# Patient Record
Sex: Male | Born: 1945 | Race: White | Hispanic: No | Marital: Married | Smoking: Never smoker
Health system: Southern US, Community
[De-identification: ages and names within clinical notes are randomized; demographics above are authoritative.]

## PROBLEM LIST (undated history)

## (undated) DIAGNOSIS — Z859 Personal history of malignant neoplasm, unspecified: Secondary | ICD-10-CM

## (undated) DIAGNOSIS — G4730 Sleep apnea, unspecified: Secondary | ICD-10-CM

## (undated) DIAGNOSIS — C439 Malignant melanoma of skin, unspecified: Secondary | ICD-10-CM

## (undated) DIAGNOSIS — Z85820 Personal history of malignant melanoma of skin: Secondary | ICD-10-CM

## (undated) DIAGNOSIS — Z9889 Other specified postprocedural states: Secondary | ICD-10-CM

## (undated) DIAGNOSIS — Z87448 Personal history of other diseases of urinary system: Secondary | ICD-10-CM

## (undated) DIAGNOSIS — Z8669 Personal history of other diseases of the nervous system and sense organs: Secondary | ICD-10-CM

## (undated) DIAGNOSIS — K649 Unspecified hemorrhoids: Secondary | ICD-10-CM

## (undated) DIAGNOSIS — M1990 Unspecified osteoarthritis, unspecified site: Secondary | ICD-10-CM

## (undated) DIAGNOSIS — K5730 Diverticulosis of large intestine without perforation or abscess without bleeding: Secondary | ICD-10-CM

## (undated) DIAGNOSIS — R350 Frequency of micturition: Secondary | ICD-10-CM

## (undated) DIAGNOSIS — Z87898 Personal history of other specified conditions: Secondary | ICD-10-CM

## (undated) DIAGNOSIS — Z87438 Personal history of other diseases of male genital organs: Secondary | ICD-10-CM

## (undated) DIAGNOSIS — Z9109 Other allergy status, other than to drugs and biological substances: Secondary | ICD-10-CM

## (undated) DIAGNOSIS — N35919 Unspecified urethral stricture, male, unspecified site: Secondary | ICD-10-CM

## (undated) HISTORY — PX: TONSILLECTOMY: SUR1361

## (undated) HISTORY — PX: OTHER SURGICAL HISTORY: SHX169

## (undated) HISTORY — DX: Unspecified hemorrhoids: K64.9

## (undated) HISTORY — PX: UVULOPALATOPHARYNGOPLASTY: SHX827

## (undated) HISTORY — PX: LAPAROSCOPIC INGUINAL HERNIA REPAIR: SUR788

## (undated) HISTORY — PX: KNEE ARTHROSCOPY W/ MENISCECTOMY: SHX1879

## (undated) HISTORY — DX: Personal history of other diseases of the nervous system and sense organs: Z86.69

---

## 2004-01-01 ENCOUNTER — Other Ambulatory Visit: Payer: Self-pay

## 2004-03-16 ENCOUNTER — Ambulatory Visit: Payer: Self-pay | Admitting: Gastroenterology

## 2004-05-19 ENCOUNTER — Ambulatory Visit: Payer: Self-pay | Admitting: Surgery

## 2006-04-25 HISTORY — PX: OTHER SURGICAL HISTORY: SHX169

## 2006-05-20 ENCOUNTER — Emergency Department: Payer: Self-pay | Admitting: Emergency Medicine

## 2006-05-23 ENCOUNTER — Ambulatory Visit: Payer: Self-pay | Admitting: Specialist

## 2009-05-20 ENCOUNTER — Emergency Department: Payer: Self-pay | Admitting: Emergency Medicine

## 2012-04-09 LAB — BASIC METABOLIC PANEL
Potassium: 4.4 mmol/L (ref 3.4–5.3)
Sodium: 142 mmol/L (ref 137–147)

## 2012-04-09 LAB — LIPID PANEL: Cholesterol: 178 mg/dL (ref 0–200)

## 2012-04-09 LAB — TSH: TSH: 1.12 u[IU]/mL (ref 0.41–5.90)

## 2012-10-03 ENCOUNTER — Ambulatory Visit: Payer: Self-pay | Admitting: Internal Medicine

## 2012-10-08 ENCOUNTER — Encounter: Payer: Self-pay | Admitting: Internal Medicine

## 2012-10-08 ENCOUNTER — Ambulatory Visit (INDEPENDENT_AMBULATORY_CARE_PROVIDER_SITE_OTHER): Payer: Managed Care, Other (non HMO) | Admitting: Internal Medicine

## 2012-10-08 VITALS — BP 110/70 | HR 62 | Temp 98.0°F | Ht 69.25 in | Wt 181.5 lb

## 2012-10-08 DIAGNOSIS — C4490 Unspecified malignant neoplasm of skin, unspecified: Secondary | ICD-10-CM

## 2012-10-08 DIAGNOSIS — M1990 Unspecified osteoarthritis, unspecified site: Secondary | ICD-10-CM

## 2012-10-08 DIAGNOSIS — K649 Unspecified hemorrhoids: Secondary | ICD-10-CM

## 2012-10-08 DIAGNOSIS — N400 Enlarged prostate without lower urinary tract symptoms: Secondary | ICD-10-CM

## 2012-10-08 DIAGNOSIS — Z9109 Other allergy status, other than to drugs and biological substances: Secondary | ICD-10-CM

## 2012-10-08 DIAGNOSIS — M129 Arthropathy, unspecified: Secondary | ICD-10-CM

## 2012-10-09 ENCOUNTER — Encounter: Payer: Self-pay | Admitting: Internal Medicine

## 2012-10-09 DIAGNOSIS — Z9109 Other allergy status, other than to drugs and biological substances: Secondary | ICD-10-CM | POA: Insufficient documentation

## 2012-10-09 DIAGNOSIS — C4490 Unspecified malignant neoplasm of skin, unspecified: Secondary | ICD-10-CM | POA: Insufficient documentation

## 2012-10-09 DIAGNOSIS — N400 Enlarged prostate without lower urinary tract symptoms: Secondary | ICD-10-CM | POA: Insufficient documentation

## 2012-10-09 DIAGNOSIS — K649 Unspecified hemorrhoids: Secondary | ICD-10-CM | POA: Insufficient documentation

## 2012-10-09 DIAGNOSIS — M1990 Unspecified osteoarthritis, unspecified site: Secondary | ICD-10-CM | POA: Insufficient documentation

## 2012-10-09 DIAGNOSIS — Z91048 Other nonmedicinal substance allergy status: Secondary | ICD-10-CM | POA: Insufficient documentation

## 2012-10-09 NOTE — Assessment & Plan Note (Signed)
Intermittent flares.  Uses stool softeners and fiber pills.  Follow.

## 2012-10-09 NOTE — Assessment & Plan Note (Signed)
Knee pain and neck pain intermittently.  Exercises.  Takes Mobic rarely.  Follow. Stable.

## 2012-10-09 NOTE — Assessment & Plan Note (Signed)
Sees Dr Tannenbaum ([facility]).  On current med regimen.  Follow.   

## 2012-10-09 NOTE — Assessment & Plan Note (Signed)
On Claritin now.  Saline nasal flushes - continue.

## 2012-10-09 NOTE — Assessment & Plan Note (Signed)
History of squamous cell cancer and melanoma.  Sees Dr Adolphus Birchwood and Dr Lorn Junes [facility]).  Stable.  Follow.

## 2012-10-09 NOTE — Progress Notes (Signed)
Subjective:    [name], male    DOB: [birth year], 66 y.o.   MRN: [id number]  HPI 67 year old male with past history of an enlarged prostate followed by Dr Marcello Fennel, allergies and arthritis.  He comes in today to follow up on these issues as well as to establish care.  Has some issues with neck pain.  Saw dr Katrinka Blazing.  Was told had arthritis and spurs.  Some issues with arthritis in his knees.  Stays active.  Rides his bicycle.  Helps.  Some allergy problems.  Previously took Careers adviser.  Now on Claritin.  Seeing if this will control symptoms.  Continue saline nasal flushes.  No cough or congestion.  No sob.  No significant acid reflux.  Sees Dr Adolphus Birchwood for skin cancer.  Had melanoma on his back.  Squamous cell cancer - right arm, leg and face.  Also followed by Dr Lorn Junes at [facility].  He also has problems with urinary hesitancy, etc.  Has an enlarged prostate.  Sees Dr Patsi Sears.   He is following PSAs.    Past Medical History  Diagnosis Date  . H/O sleep apnea   . Allergy     Chronic  . Hemorrhoids   . Diverticulosis 10/1996    Seen on flexible sigmoidoscopy  . BPH with elevated PSA   . Cancer     melenoma  . Cancer     Squamous    Outpatient Encounter Prescriptions as of 10/08/2012  Medication Sig Dispense Refill  . alfuzosin (UROXATRAL) 10 MG 24 hr tablet Take 10 mg by mouth daily.      Marland Kitchen dutasteride (AVODART) 0.5 MG capsule Take 0.5 mg by mouth daily.      . fexofenadine (ALLEGRA) 180 MG tablet Take 1 tablet by mouth once a day as needed      . hydrocortisone (PROCTOZONE-HC) 2.5 % rectal cream Place 1 application rectally daily.      . meloxicam (MOBIC) 15 MG tablet Take 15 mg by mouth daily.      . Multiple Vitamins-Minerals-FA (UDAMIN SP PO) Take by mouth daily.      . solifenacin (VESICARE) 5 MG tablet Take 10 mg by mouth daily.      . [DISCONTINUED] hydrocortisone (ANUSOL-HC) 2.5 % rectal cream Place rectally 2 (two) times daily.       No facility-administered  encounter medications on file as of 10/08/2012.    Review of Systems Patient denies any headache, lightheadedness or dizziness.  On claritin for allergies.  See above.  No chest pain, tightness or palpitations.  No increased shortness of breath, cough or congestion.  No nausea or vomiting.  No acid reflux.  No abdominal pain or cramping.  No bowel change, such as diarrhea, constipation, BRBPR or melana.  Some problems with hemorrhoids.  Takes a stool softener and fiver pills.  Urinary issues as outlined.  Some arthritis as outlined.  Stays active.  Rides his bike.  Had colonoscopy.  Sates was told he needed f/u colonoscopy in 10 years from last.  Obtain records from outside.       Objective:   Physical Exam Filed Vitals:   10/08/12 1034  BP: 110/70  Pulse: 62  Temp: 98 F (31.35 C)   67 year old male in no acute distress.  HEENT:  Nares - clear.  Oropharynx - without lesions. NECK:  Supple.  Nontender.  No audible carotid bruit.  HEART:  Appears to be regular.   LUNGS:  No crackles  or wheezing audible.  Respirations even and unlabored.   RADIAL PULSE:  Equal bilaterally.  ABDOMEN:  Soft.  Nontender.  Bowel sounds present and normal.  No audible abdominal bruit.  GU:  Performed through urology.  EXTREMITIES:  No increased edema present.  DP pulses palpable and equal bilaterally.     SKIN:  No rash.       Assessment & Plan:  HEALTH MAINTENANCE.  Prostate checks and PSAs performed through urology.  Colonoscopy - normal per pt.  Obtain records.  States recommended f/u colonoscopy in 10 years after last.

## 2013-04-16 ENCOUNTER — Ambulatory Visit (INDEPENDENT_AMBULATORY_CARE_PROVIDER_SITE_OTHER): Payer: Managed Care, Other (non HMO) | Admitting: Internal Medicine

## 2013-04-16 ENCOUNTER — Encounter: Payer: Self-pay | Admitting: Internal Medicine

## 2013-04-16 VITALS — BP 120/80 | HR 66 | Temp 98.2°F | Ht 69.25 in | Wt 185.5 lb

## 2013-04-16 DIAGNOSIS — J069 Acute upper respiratory infection, unspecified: Secondary | ICD-10-CM

## 2013-04-16 MED ORDER — AMOXICILLIN 875 MG PO TABS: 875.0000 mg | ORAL_TABLET | Freq: Two times a day (BID) | ORAL | Status: DC

## 2013-04-16 NOTE — Progress Notes (Signed)
Pre-visit discussion using our clinic review tool. No additional management support is needed unless otherwise documented below in the visit note.  

## 2013-04-19 ENCOUNTER — Encounter: Payer: Self-pay | Admitting: Internal Medicine

## 2013-04-19 NOTE — Assessment & Plan Note (Signed)
Symptoms as outlined.  Persistent.  Treat with amoxicillin as directed.  Saline nasal spray as directed.  Follow.

## 2013-04-19 NOTE — Progress Notes (Signed)
  Subjective:    [name], male    DOB: 08/21/1945, 67 y.o.   MRN: [id number]  Cough  67 year old male with past history of an enlarged prostate followed by Dr Marcello Fennel, allergies and arthritis.  He comes in today as a work in with concerns regarding cough and congestion.  Symptoms started one week ago.  Started with sore throat.  Developed increased cough and congestion.  Went to acute care three days ago.  Was given tessalon perles and cough syrup.  Is having persistent symptoms.  Cough is now productive of green mucus.  Minimal sore throat.  Some drainage.  No vomiting or diarrhea.  No chest tightness or wheezing.  Occasional runny nose.  Taking tylenol cold as well.     Past Medical History  Diagnosis Date  . H/O sleep apnea   . Allergy     Chronic  . Hemorrhoids   . Diverticulosis 10/1996    Seen on flexible sigmoidoscopy  . BPH with elevated PSA   . Cancer     melenoma  . Cancer     Squamous  . Nephritis     age 34    Outpatient Encounter Prescriptions as of 04/16/2013  Medication Sig  . alfuzosin (UROXATRAL) 10 MG 24 hr tablet Take 10 mg by mouth daily.  . benzonatate (TESSALON) 100 MG capsule Take by mouth 3 (three) times daily as needed for cough.  . chlorpheniramine-HYDROcodone (TUSSIONEX) 10-8 MG/5ML LQCR Take 5 mLs by mouth at bedtime as needed for cough.  . fexofenadine (ALLEGRA) 180 MG tablet Take 1 tablet by mouth once a day as needed  . hydrocortisone (PROCTOZONE-HC) 2.5 % rectal cream Place 1 application rectally daily.  . meloxicam (MOBIC) 15 MG tablet Take 15 mg by mouth daily.  . Multiple Vitamins-Minerals-FA (UDAMIN SP PO) Take by mouth daily.  . solifenacin (VESICARE) 5 MG tablet Take 10 mg by mouth daily.  Marland Kitchen amoxicillin (AMOXIL) 875 MG tablet Take 1 tablet (875 mg total) by mouth 2 (two) times daily.  . [DISCONTINUED] dutasteride (AVODART) 0.5 MG capsule Take 0.5 mg by mouth daily.    Review of Systems  Respiratory: Positive for cough.    Patient denies any headache, lightheadedness or dizziness.  Occasional runny nose.  Minimal sore throat.  No chest pain or tightness.  No increased shortness of breath.  Does report the cough and congestion as outlined.   No nausea or vomiting.  No acid reflux.  No bowel change, such as diarrhea.        Objective:   Physical Exam  Filed Vitals:   04/16/13 1203  BP: 120/80  Pulse: 66  Temp: 98.2 F (40.36 C)   67 year old male in no acute distress.  HEENT:  Nares - clear.  Oropharynx - without lesions. NECK:  Supple.  Nontender.  No audible carotid bruit.  HEART:  Appears to be regular.   LUNGS:  No crackles or wheezing audible.  Respirations even and unlabored.       Assessment & Plan:  HEALTH MAINTENANCE.  Prostate checks and PSAs performed through urology.  Colonoscopy - normal per pt.  States recommended f/u colonoscopy in 10 years after last.

## 2013-05-16 ENCOUNTER — Telehealth: Payer: Self-pay | Admitting: Internal Medicine

## 2013-05-16 NOTE — Telephone Encounter (Addendum)
fexofenadine (ALLEGRA) 180 MG tablet   hydrocortisone (PROCTOZONE-HC) 2.5 % rectal cream  [place].

## 2013-05-17 ENCOUNTER — Other Ambulatory Visit: Payer: Self-pay | Admitting: *Deleted

## 2013-05-17 MED ORDER — HYDROCORTISONE 2.5 % RE CREA: 1.0000 "application " | TOPICAL_CREAM | Freq: Every day | RECTAL | Status: DC

## 2013-05-17 MED ORDER — FEXOFENADINE HCL 180 MG PO TABS: 180.0000 mg | ORAL_TABLET | Freq: Every day | ORAL | Status: DC | PRN

## 2013-05-17 NOTE — Telephone Encounter (Signed)
Rx sent via eRx

## 2013-05-21 ENCOUNTER — Telehealth: Payer: Self-pay | Admitting: Internal Medicine

## 2013-05-21 NOTE — Telephone Encounter (Signed)
Fexofenadine 180 mg tablet (Allegra)  - call to [facility].  Generic hydrocortisone 2.5% cream (is this a viable substitute for proctozone -HC 2.5% rectal cream?) - Call into [facility] Would like 90 day supply, can get for $10.00  Pt states he can get the generic hydrocortisone from [facility] for much cheaper.  Meds were previously prescribed by Dr. Ola Spurr.  Needs new rx written by Dr. Nicki Reaper.

## 2013-05-22 MED ORDER — FEXOFENADINE HCL 180 MG PO TABS: 180.0000 mg | ORAL_TABLET | Freq: Every day | ORAL | Status: DC | PRN

## 2013-05-22 MED ORDER — HYDROCORTISONE 2.5 % RE CREA: 1.0000 "application " | TOPICAL_CREAM | Freq: Every day | RECTAL | Status: DC

## 2013-05-22 NOTE — Telephone Encounter (Signed)
Pt.notified

## 2013-05-22 NOTE — Telephone Encounter (Signed)
I sent in the allegra to [facility] and the hydrocortisone cream to [facility].

## 2013-05-23 ENCOUNTER — Telehealth: Payer: Self-pay | Admitting: Internal Medicine

## 2013-05-23 NOTE — Telephone Encounter (Signed)
I thought I sent in the right medication on 05/22/13.  Did he check with pharmacy and is this right.

## 2013-05-23 NOTE — Telephone Encounter (Signed)
Or cell phone # [phone]  Pt came in today wanting to get a rx for hydrocortisone 2.5% cream (30gm tube)   manuf [place] [facility] # [id number] For hemroids Pt wanted to make sure this would be ok to use instead proctzone hc [facility] [address] Please advise pt

## 2013-05-24 ENCOUNTER — Telehealth: Payer: Self-pay | Admitting: *Deleted

## 2013-05-24 MED ORDER — HYDROCORTISONE 2.5 % RE CREA: 1.0000 "application " | TOPICAL_CREAM | Freq: Every day | RECTAL | Status: DC

## 2013-05-24 NOTE — Telephone Encounter (Signed)
Can see if analpram is any cheaper but I think the generic annusol HC is going to be the cheapest medicaiton.  analpram apply to affected area bid x 1 week.  One tube.  No refills.

## 2013-05-24 NOTE — Telephone Encounter (Signed)
Pt walked in office to show [place] which medication he needed sent in. I sent in Rx while patient was in office

## 2013-05-24 NOTE — Telephone Encounter (Signed)
Patients wife called states that the patient needs this medication called in she states he came up here yesterday trying to get this called in. She states it still hasn't been called in wanted to know if he needed to come back up here. I advised her no that it should be taken care of this afternoon.

## 2013-05-24 NOTE — Telephone Encounter (Signed)
Pt called back & I spoke with him as well as the pharmacy & notified him that the [place] $10 list is for the topical Hydrocortisone cream, not the rectal cream. Pt wants to know what else he can do? He will need to have something for the weekend. Please advise

## 2013-05-25 NOTE — Telephone Encounter (Signed)
I spoke with pharmacy & was notified that the analpram would cost about $60  (Same as the [place])

## 2013-07-09 ENCOUNTER — Telehealth: Payer: Self-pay | Admitting: Emergency Medicine

## 2013-07-09 NOTE — Telephone Encounter (Signed)
Referral to [facility] underway for Dr. Evorn Gong

## 2013-07-15 NOTE — Telephone Encounter (Signed)
Pt approved to see [facility] with 4 visits, exp 10/14/13. Auth # [id number]

## 2013-07-17 ENCOUNTER — Encounter: Payer: Self-pay | Admitting: Adult Health

## 2013-07-17 ENCOUNTER — Ambulatory Visit (INDEPENDENT_AMBULATORY_CARE_PROVIDER_SITE_OTHER): Payer: Commercial Managed Care - HMO | Admitting: Adult Health

## 2013-07-17 VITALS — BP 108/62 | HR 59 | Temp 98.2°F | Resp 14 | Wt 179.0 lb

## 2013-07-17 DIAGNOSIS — J069 Acute upper respiratory infection, unspecified: Secondary | ICD-10-CM

## 2013-07-17 DIAGNOSIS — J020 Streptococcal pharyngitis: Secondary | ICD-10-CM

## 2013-07-17 LAB — POCT RAPID STREP A (OFFICE): Rapid Strep A Screen: NEGATIVE

## 2013-07-17 MED ORDER — AMOXICILLIN 875 MG PO TABS: 875.0000 mg | ORAL_TABLET | Freq: Two times a day (BID) | ORAL | Status: DC

## 2013-07-17 MED ORDER — AMOXICILLIN 875 MG PO TABS
875.0000 mg | ORAL_TABLET | Freq: Two times a day (BID) | ORAL | Status: DC
Start: 2013-07-17 — End: 2013-09-13

## 2013-07-17 NOTE — Progress Notes (Signed)
Pre visit review using our clinic review tool, if applicable. No additional management support is needed unless otherwise documented below in the visit note. 

## 2013-07-17 NOTE — Patient Instructions (Signed)
  Start Amoxicillin as directed.  Chloraseptic spray or lozenges for your sore throat.  Drink plenty of fluids to maintain hydration.  Upper Respiratory Infection, Adult An upper respiratory infection (URI) is also known as the common cold. It is often caused by a type of germ (virus). Colds are easily spread (contagious). You can pass it to others by kissing, coughing, sneezing, or drinking out of the same glass. Usually, you get better in 1 or 2 weeks.  HOME CARE   Only take medicine as told by your doctor.  Use a warm mist humidifier or breathe in steam from a hot shower.  Drink enough water and fluids to keep your pee (urine) clear or pale yellow.  Get plenty of rest.  Return to work when your temperature is back to normal or as told by your doctor. You may use a face mask and wash your hands to stop your cold from spreading. GET HELP RIGHT AWAY IF:   After the first few days, you feel you are getting worse.  You have questions about your medicine.  You have chills, shortness of breath, or brown or red spit (mucus).  You have yellow or brown snot (nasal discharge) or pain in the face, especially when you bend forward.  You have a fever, puffy (swollen) neck, pain when you swallow, or white spots in the back of your throat.  You have a bad headache, ear pain, sinus pain, or chest pain.  You have a high-pitched whistling sound when you breathe in and out (wheezing).  You have a lasting cough or cough up blood.  You have sore muscles or a stiff neck. MAKE SURE YOU:   Understand these instructions.  Will watch your condition.  Will get help right away if you are not doing well or get worse. Document Released: 09/28/2007 Document Revised: 07/04/2011 Document Reviewed: 08/16/2010 [facility] Patient Information 2014 [place], [place].

## 2013-07-17 NOTE — Progress Notes (Signed)
[name], male   DOB: 17-Jan-1946, 68 y.o.   MRN: [id number]    Subjective:    [name], male    DOB: November 11, 1945, 68 y.o.   MRN: [id number]  HPI  Pt is a pleasant 68 y/o male who presents to clinic with severe sore throat, cough and congestion since Sunday. Reports not taking any OTC medications for symptoms other than his regular Allegra. Denies fever, chills, shortness of breath or wheezing.   Past Medical History  Diagnosis Date  . H/O sleep apnea   . Allergy     Chronic  . Hemorrhoids   . Diverticulosis 10/1996    Seen on flexible sigmoidoscopy  . BPH with elevated PSA   . Cancer     melenoma  . Cancer     Squamous  . Nephritis     age 38    Current Outpatient Prescriptions on File Prior to Visit  Medication Sig Dispense Refill  . alfuzosin (UROXATRAL) 10 MG 24 hr tablet Take 10 mg by mouth daily.      . fexofenadine (ALLEGRA) 180 MG tablet Take 1 tablet (180 mg total) by mouth daily as needed for allergies or rhinitis.  90 tablet  3  . hydrocortisone (ANUSOL-HC) 2.5 % rectal cream Place 1 application rectally daily.  90 g  0  . meloxicam (MOBIC) 15 MG tablet Take 15 mg by mouth daily.      . Multiple Vitamins-Minerals-FA (UDAMIN SP PO) Take by mouth daily.      . solifenacin (VESICARE) 5 MG tablet Take 10 mg by mouth daily.       No current facility-administered medications on file prior to visit.     Review of Systems  Constitutional: Negative for fever and chills.  HENT: Positive for congestion, postnasal drip, rhinorrhea, sinus pressure and sore throat.   Respiratory: Positive for cough. Negative for shortness of breath and wheezing.   All other systems reviewed and are negative.       Objective:  BP 108/62  Pulse 59  Temp(Src) 98.2 F (36.8 C) (Oral)  Resp 14  Wt 179 lb (81.194 kg)  SpO2 95%   Physical Exam  Constitutional: He is oriented to person, place, and time. He appears well-developed and well-nourished. No  distress.  HENT:  Head: Normocephalic and atraumatic.  Right Ear: External ear normal.  Left Ear: External ear normal.  Pharyngeal erythema significant  Cardiovascular: Normal rate, regular rhythm and normal heart sounds.   Pulmonary/Chest: Effort normal and breath sounds normal. No respiratory distress. He has no wheezes. He has no rales.  Neurological: He is alert and oriented to person, place, and time.  Psychiatric: He has a normal mood and affect. His behavior is normal. Judgment and thought content normal.       Assessment & Plan:   1. URI (upper respiratory infection) Strep test Start Amoxicillin as directed

## 2013-07-19 ENCOUNTER — Telehealth: Payer: Self-pay | Admitting: Emergency Medicine

## 2013-07-19 NOTE — Telephone Encounter (Signed)
Referral for [facility] is underway for apt with Dr. Evorn Gong

## 2013-07-23 ENCOUNTER — Telehealth: Payer: Self-pay | Admitting: Internal Medicine

## 2013-07-23 NOTE — Telephone Encounter (Signed)
Saw Raquel, message forwarded

## 2013-07-23 NOTE — Telephone Encounter (Signed)
Patient Information:  Caller Name: Ibrohim  Phone: [phone]  Patient: Kyle Meadows, Kyle Meadows  Gender: Male  DOB: Nov 24, 1945  Age: 68 Years  PCP: Einar Pheasant  Office Follow Up:  Does the office need to follow up with this patient?: Yes  Instructions For The Office: Please review and Pt is requesting medications  RN Note:  Call back information given.  Pt request a rx for cough and post nasal drip that is keeping him from resting  Symptoms  Reason For Call & Symptoms: Pt was seen in office on 3/25 for URI.  Pt is calling back because he now has a cough that is limiting his sleep and would like cough medication.  Sputum is clear.  Reviewed Health History In EMR: Yes  Reviewed Medications In EMR: Yes  Reviewed Allergies In EMR: Yes  Reviewed Surgeries / Procedures: Yes  Date of Onset of Symptoms: 07/19/2013  Guideline(s) Used:  Cough  Disposition Per Guideline:   Home Care  Reason For Disposition Reached:   Cough with cold symptoms (e.g., runny nose, postnasal drip, throat clearing)  Advice Given:  Call Back If:  You become worse.  Patient Will Follow Care Advice:  YES

## 2013-07-24 ENCOUNTER — Other Ambulatory Visit: Payer: Self-pay | Admitting: Adult Health

## 2013-07-24 MED ORDER — MOMETASONE FUROATE 50 MCG/ACT NA SUSP: NASAL | Status: DC

## 2013-07-24 NOTE — Telephone Encounter (Signed)
Pt notified. States cough is all throughout the day, but worse at night, productive with clear mucus. Pt states he will start Nasonex and use Delsym today to see if this improves symptoms. Will call back tomorrow if he does not notice any improvement.

## 2013-07-24 NOTE — Telephone Encounter (Signed)
I sent in a prescription for Nasonex to help with post nasal drip. Can you find out about his cough? Is it all day? Dry? Has he tried any over the counter medications for cough? When I saw him he had not taken any OTC meds. If cough is not severe I would want him to try Desym with guaifenesin and dextromethorphan first. His cough may be from the post nasal drip. If cough is severe that keeps waking him I will prescribe a stronger cough syrup such as Tussionex.

## 2013-07-24 NOTE — Telephone Encounter (Signed)
Left message for pt to return my call.

## 2013-07-30 NOTE — Telephone Encounter (Signed)
Pt approved to see [facility] with 4 visits exp 10/28/13. Auth # [id number]

## 2013-08-06 ENCOUNTER — Encounter: Payer: Self-pay | Admitting: Internal Medicine

## 2013-08-19 ENCOUNTER — Other Ambulatory Visit: Payer: Self-pay | Admitting: *Deleted

## 2013-08-19 NOTE — Telephone Encounter (Signed)
I am ok to refill this x 1, but if he continues to have problems, he needs evaluation.

## 2013-08-19 NOTE — Telephone Encounter (Signed)
Ok refill? 

## 2013-08-26 MED ORDER — HYDROCORTISONE 2.5 % RE CREA: 1.0000 "application " | TOPICAL_CREAM | Freq: Every day | RECTAL | Status: DC

## 2013-08-26 NOTE — Telephone Encounter (Signed)
Pt walked in requesting refill on his cream. Pt was notified that if he continues to have problems that he will need to be seen. And that we will send in one refill today. Pt states that it has been 10 days since he initially requested this medication.

## 2013-09-13 ENCOUNTER — Ambulatory Visit (INDEPENDENT_AMBULATORY_CARE_PROVIDER_SITE_OTHER): Payer: Commercial Managed Care - HMO | Admitting: Internal Medicine

## 2013-09-13 ENCOUNTER — Encounter: Payer: Self-pay | Admitting: Internal Medicine

## 2013-09-13 VITALS — BP 100/70 | HR 59 | Temp 98.2°F | Ht 69.25 in | Wt 173.5 lb

## 2013-09-13 DIAGNOSIS — C4490 Unspecified malignant neoplasm of skin, unspecified: Secondary | ICD-10-CM

## 2013-09-13 DIAGNOSIS — K649 Unspecified hemorrhoids: Secondary | ICD-10-CM

## 2013-09-13 DIAGNOSIS — N400 Enlarged prostate without lower urinary tract symptoms: Secondary | ICD-10-CM

## 2013-09-13 DIAGNOSIS — Z9109 Other allergy status, other than to drugs and biological substances: Secondary | ICD-10-CM

## 2013-09-13 MED ORDER — HYDROCORTISONE 2.5 % RE CREA: 1.0000 "application " | TOPICAL_CREAM | Freq: Every day | RECTAL | Status: DC

## 2013-09-13 NOTE — Progress Notes (Signed)
Pre visit review using our clinic review tool, if applicable. No additional management support is needed unless otherwise documented below in the visit note. 

## 2013-09-16 ENCOUNTER — Encounter: Payer: Self-pay | Admitting: Internal Medicine

## 2013-09-16 NOTE — Assessment & Plan Note (Signed)
Intermittent flares.  Uses stool softeners and fiber pills.  Follow.  Refilled proctosol.

## 2013-09-16 NOTE — Assessment & Plan Note (Signed)
On allegra.  Saline nasal flushes - continue.    

## 2013-09-16 NOTE — Progress Notes (Signed)
Subjective:    [name], male    DOB: 12/25/1945, 68 y.o.   MRN: [id number]  HPI 68 year old male with past history of an enlarged prostate followed by Dr Hartley Barefoot, allergies and arthritis.  He comes in today for a scheduled follow up.  States he is doing relatively well.  Lose his job recently.  Handling this well.  No significant allergy problems reported.  No cough or congestion.  No sob.  No significant acid reflux.  Sees Dr Evorn Gong for skin cancer.  Had melanoma on his back.  Squamous cell cancer - right arm, leg and face.  Also followed by Dr Manley Mason at [facility].  He also has problems with urinary hesitancy, etc.  Has an enlarged prostate.  Sees Dr Gaynelle Arabian.   He is following PSAs.  He reports having intermittent problems with hemorrhoids.  Uses proctosol.  Has seen two surgeons.  Did not feel surgery warranted.  States may flare 2-3x/year.  Takes fiber and a stool softener daily.    Past Medical History  Diagnosis Date  . H/O sleep apnea   . Allergy     Chronic  . Hemorrhoids   . Diverticulosis 10/1996    Seen on flexible sigmoidoscopy  . BPH with elevated PSA   . Cancer     melenoma  . Cancer     Squamous  . Nephritis     age 45    Outpatient Encounter Prescriptions as of 09/13/2013  Medication Sig  . alfuzosin (UROXATRAL) 10 MG 24 hr tablet Take 10 mg by mouth daily.  . fexofenadine (ALLEGRA) 180 MG tablet Take 1 tablet (180 mg total) by mouth daily as needed for allergies or rhinitis.  . hydrocortisone (ANUSOL-HC) 2.5 % rectal cream Place 1 application rectally daily.  . meloxicam (MOBIC) 15 MG tablet Take 15 mg by mouth daily as needed.   . mometasone (NASONEX) 50 MCG/ACT nasal spray Place 2 sprays into the nose daily as needed. 2 sprays into each nostril daily  . Multiple Vitamins-Minerals-FA (UDAMIN SP PO) Take by mouth daily.  . solifenacin (VESICARE) 5 MG tablet Take 10 mg by mouth daily.  . [DISCONTINUED] hydrocortisone (ANUSOL-HC) 2.5 % rectal cream Place  1 application rectally daily.  . [DISCONTINUED] mometasone (NASONEX) 50 MCG/ACT nasal spray 2 sprays into each nostril daily  . [DISCONTINUED] amoxicillin (AMOXIL) 875 MG tablet Take 1 tablet (875 mg total) by mouth 2 (two) times daily.    Review of Systems Patient denies any headache, lightheadedness or dizziness.  On allegra for allergies.   No chest pain, tightness or palpitations.  No increased shortness of breath, cough or congestion.  No nausea or vomiting.  No acid reflux.  No abdominal pain or cramping.  No bowel change, such as diarrhea, constipation, BRBPR or melana.  Some problems with hemorrhoids.  Takes a stool softener and fiber pills.  Urinary issues as outlined.  Stays active.  Rides his bike.  Had colonoscopy.  Sates was told he needed f/u colonoscopy in 10 years from last.        Objective:   Physical Exam  Filed Vitals:   09/13/13 0758  BP: 100/70  Pulse: 59  Temp: 98.2 F (36.8 C)   Blood pressure recheck:  74/75  68 year old male in no acute distress.  HEENT:  Nares - clear.  Oropharynx - without lesions. NECK:  Supple.  Nontender.  No audible carotid bruit.  HEART:  Appears to be regular.   LUNGS:  No crackles or wheezing audible.  Respirations even and unlabored.   RADIAL PULSE:  Equal bilaterally.  ABDOMEN:  Soft.  Nontender.  Bowel sounds present and normal.  No audible abdominal bruit.  EXTREMITIES:  No increased edema present.  DP pulses palpable and equal bilaterally.       Assessment & Plan:  HEALTH MAINTENANCE.  Prostate checks and PSAs performed through urology.  Colonoscopy - normal per pt.  States recommended f/u colonoscopy in 10 years after last.

## 2013-09-16 NOTE — Assessment & Plan Note (Signed)
Sees Dr Gaynelle Arabian [facility]).  On current med regimen.  Follow.

## 2013-09-16 NOTE — Assessment & Plan Note (Signed)
History of squamous cell cancer and melanoma.  Sees Dr Evorn Gong and Dr Manley Mason [facility]).  Stable.  Follow.  Has f/u with Dr Evorn Gong 10/29/13.

## 2013-09-20 ENCOUNTER — Ambulatory Visit: Payer: Commercial Managed Care - HMO | Admitting: Internal Medicine

## 2013-10-09 ENCOUNTER — Encounter: Payer: Managed Care, Other (non HMO) | Admitting: Internal Medicine

## 2013-10-09 ENCOUNTER — Ambulatory Visit (INDEPENDENT_AMBULATORY_CARE_PROVIDER_SITE_OTHER): Payer: Commercial Managed Care - HMO | Admitting: Adult Health

## 2013-10-09 ENCOUNTER — Encounter: Payer: Self-pay | Admitting: Adult Health

## 2013-10-09 VITALS — BP 121/77 | HR 56 | Temp 98.1°F | Resp 14 | Wt 174.5 lb

## 2013-10-09 DIAGNOSIS — T148XXA Other injury of unspecified body region, initial encounter: Secondary | ICD-10-CM

## 2013-10-09 MED ORDER — METHOCARBAMOL 750 MG PO TABS: 750.0000 mg | ORAL_TABLET | Freq: Three times a day (TID) | ORAL | Status: DC

## 2013-10-09 NOTE — Patient Instructions (Signed)
  Take ibuprofen 600 mg every 6 hours for the next 4-5 days. Take with food.  Robaxin 750 mg 3 times a day for muscle spasms. May cause sedation.  Ice the area for 15 min 4-5 times daily.  Rest - if your symptoms are aggravated then don't do it.  Stretch psoas muscle as we discussed.

## 2013-10-09 NOTE — Progress Notes (Signed)
[name], male   DOB: 1946-01-29, 68 y.o.   MRN: [id number]   Subjective:    [name], male    DOB: 1945/05/10, 68 y.o.   MRN: [id number]  HPI  Patient is a pleasant 68 year old male who presents to clinic with pain of left groin that has been ongoing for a little over one week. He reports that he was riding his bike with his wife. The surface was smooth and not much hills. He did not have any trouble while he was riding. He was storing his bikes in his car when they were getting ready to leave and he noticed a pull in the lower abdomen toward his groin. He has not taken any medication for his pain. He has not iced the area. The pain is worse with sitting. Does not exhibit much pain with walking.    Past Medical History  Diagnosis Date  . H/O sleep apnea   . Allergy     Chronic  . Hemorrhoids   . Diverticulosis 10/1996    Seen on flexible sigmoidoscopy  . BPH with elevated PSA   . Cancer     melenoma  . Cancer     Squamous  . Nephritis     age 89     Past Surgical History  Procedure Laterality Date  . Uvulopalatopharyngoplasty      5 times  . Tonsillectomy    . Knee surgery      right x 2, torn meniscus  . Hernia repair      inguinal  . Wrist surgery      right, s/p trauma     Family History  Problem Relation Age of Onset  . Heart disease Father     CABG x 4  . Arthritis Father      History   Social History  . Marital Status: Married    Spouse Name: N/A    Number of Children: N/A  . Years of Education: N/A   Occupational History  . Not on file.   Social History Main Topics  . Smoking status: Never Smoker   . Smokeless tobacco: Never Used  . Alcohol Use: Yes  . Drug Use: No  . Sexual Activity: Not on file   Other Topics Concern  . Not on file   Social History Narrative  . No narrative on file     Current Outpatient Prescriptions on File Prior to Visit  Medication Sig Dispense Refill  . alfuzosin (UROXATRAL) 10  MG 24 hr tablet Take 10 mg by mouth daily.      . fexofenadine (ALLEGRA) 180 MG tablet Take 1 tablet (180 mg total) by mouth daily as needed for allergies or rhinitis.  90 tablet  3  . hydrocortisone (ANUSOL-HC) 2.5 % rectal cream Place 1 application rectally daily.  90 g  2  . meloxicam (MOBIC) 15 MG tablet Take 15 mg by mouth daily as needed.       . mometasone (NASONEX) 50 MCG/ACT nasal spray Place 2 sprays into the nose daily as needed. 2 sprays into each nostril daily      . Multiple Vitamins-Minerals-FA (UDAMIN SP PO) Take by mouth daily.      . solifenacin (VESICARE) 5 MG tablet Take 10 mg by mouth daily.       No current facility-administered medications on file prior to visit.     Review of Systems  Genitourinary: Negative for scrotal swelling and testicular pain.  Musculoskeletal: Negative  for arthralgias, back pain, gait problem and myalgias.       Pain right groin  All other systems reviewed and are negative.      Objective:  BP 121/77  Pulse 56  Temp(Src) 98.1 F (36.7 C) (Oral)  Resp 14  Wt 174 lb 8 oz (79.153 kg)  SpO2 97%   Physical Exam Negative for hernia, no pain with internal or external rotation of hip on the left, no pain with abduction of hip on the left Positive for tenderness with palpation over psoas muscle on the left side. Excellent femoral pulse on the left     Assessment & Plan:   1. Muscle strain Suspect psoas muscle strain. Ice, NSAIDs, rest, psoas stretch. Instructions provided in witting.

## 2013-10-09 NOTE — Progress Notes (Signed)
Pre visit review using our clinic review tool, if applicable. No additional management support is needed unless otherwise documented below in the visit note. 

## 2013-10-28 ENCOUNTER — Other Ambulatory Visit: Payer: Self-pay | Admitting: Internal Medicine

## 2013-10-28 DIAGNOSIS — L989 Disorder of the skin and subcutaneous tissue, unspecified: Secondary | ICD-10-CM

## 2013-10-28 DIAGNOSIS — N400 Enlarged prostate without lower urinary tract symptoms: Secondary | ICD-10-CM

## 2013-10-28 NOTE — Progress Notes (Signed)
Orders placed for dermatology and urology referral.  Pt has appts scheduled.

## 2013-11-05 ENCOUNTER — Encounter: Payer: Commercial Managed Care - HMO | Admitting: Internal Medicine

## 2014-01-28 ENCOUNTER — Ambulatory Visit (INDEPENDENT_AMBULATORY_CARE_PROVIDER_SITE_OTHER): Payer: Commercial Managed Care - HMO | Admitting: Internal Medicine

## 2014-01-28 ENCOUNTER — Encounter: Payer: Self-pay | Admitting: Internal Medicine

## 2014-01-28 VITALS — BP 110/70 | HR 64 | Temp 98.1°F | Ht 69.25 in | Wt 173.0 lb

## 2014-01-28 DIAGNOSIS — N400 Enlarged prostate without lower urinary tract symptoms: Secondary | ICD-10-CM

## 2014-01-28 DIAGNOSIS — Z13220 Encounter for screening for lipoid disorders: Secondary | ICD-10-CM

## 2014-01-28 DIAGNOSIS — R1032 Left lower quadrant pain: Secondary | ICD-10-CM

## 2014-01-28 DIAGNOSIS — Z91048 Other nonmedicinal substance allergy status: Secondary | ICD-10-CM

## 2014-01-28 DIAGNOSIS — K649 Unspecified hemorrhoids: Secondary | ICD-10-CM

## 2014-01-28 DIAGNOSIS — Z9109 Other allergy status, other than to drugs and biological substances: Secondary | ICD-10-CM

## 2014-01-28 DIAGNOSIS — Z23 Encounter for immunization: Secondary | ICD-10-CM

## 2014-01-28 DIAGNOSIS — C4490 Unspecified malignant neoplasm of skin, unspecified: Secondary | ICD-10-CM

## 2014-01-28 NOTE — Progress Notes (Signed)
Subjective:    [name], male    DOB: 09/08/1945, 68 y.o.   MRN: [id number]  HPI 68 year old male with past history of an enlarged prostate followed by Dr Hartley Barefoot, allergies and arthritis.  He comes in today to follow up on these issues as well as for a complete physical exam.  States he is doing relatively well.  Lost his job recently.  Now working another job.  This is going well.  Decreased stress.   No significant allergy problems reported.  No cough or congestion.  No sob.  No significant acid reflux.  Sees Dr Evorn Gong for skin cancer.  Had melanoma on his back.  Squamous cell cancer - right arm, leg and face.  Also followed by Dr Manley Mason at [facility].  He also has problems with urinary hesitancy, etc.  Has an enlarged prostate.  Sees Dr Gaynelle Arabian.   He is following PSAs.  Just recently evaluated.  Informed him (and me today) of discomfort - lower abdomen.  Describes at a constant ache.  Walking agrevates  States Dr Gaynelle Arabian evaluated and felt prostate ok.  States might have small hernia - not palpated.  Taking mobic.  Due to f/u with urology 02/03/14.  No nausea or vomiting.  Bowels stable.     Past Medical History  Diagnosis Date  . H/O sleep apnea   . Allergy     Chronic  . Hemorrhoids   . Diverticulosis 10/1996    Seen on flexible sigmoidoscopy  . BPH with elevated PSA   . Cancer     melenoma  . Cancer     Squamous  . Nephritis     age 60    Outpatient Encounter Prescriptions as of 01/28/2014  Medication Sig  . alfuzosin (UROXATRAL) 10 MG 24 hr tablet Take 10 mg by mouth daily.  . fexofenadine (ALLEGRA) 180 MG tablet Take 1 tablet (180 mg total) by mouth daily as needed for allergies or rhinitis.  . hydrocortisone (ANUSOL-HC) 2.5 % rectal cream Place 1 application rectally daily.  . meloxicam (MOBIC) 15 MG tablet Take 15 mg by mouth daily as needed.   . Multiple Vitamins-Minerals-FA (UDAMIN SP PO) Take by mouth daily.  Marland Kitchen oxybutynin (DITROPAN-XL) 10 MG 24 hr tablet  Take 10 mg by mouth daily.  . [DISCONTINUED] methocarbamol (ROBAXIN) 750 MG tablet Take 1 tablet (750 mg total) by mouth 3 (three) times daily.  . [DISCONTINUED] mometasone (NASONEX) 50 MCG/ACT nasal spray Place 2 sprays into the nose daily as needed. 2 sprays into each nostril daily  . [DISCONTINUED] solifenacin (VESICARE) 5 MG tablet Take 10 mg by mouth daily.    Review of Systems Patient denies any headache, lightheadedness or dizziness.  On allegra for allergies.   No chest pain, tightness or palpitations.  No increased shortness of breath, cough or congestion.  No nausea or vomiting.  No acid reflux.  No abdominal pain or cramping.  No bowel change, such as diarrhea, constipation, BRBPR or melana.   Urinary issues as outlined.  Stays active.  Rides his bike.  Had colonoscopy.  Sates was told he needed f/u colonoscopy in 10 years from last.  Lower quadrant pain as outlined.  Will occasionally notice it radiating to his testicle.  See above.        Objective:   Physical Exam  Filed Vitals:   01/28/14 0837  BP: 110/70  Pulse: 64  Temp: 98.1 F (36.7 C)   Blood pressure recheck:  118/72  68 year old male in no acute distress.  HEENT:  Nares - clear.  Oropharynx - without lesions. NECK:  Supple.  Nontender.  No audible carotid bruit.  HEART:  Appears to be regular.   LUNGS:  No crackles or wheezing audible.  Respirations even and unlabored.   RADIAL PULSE:  Equal bilaterally.  ABDOMEN:  Soft. Minimal tenderness to palpation - lower quadrant.   Bowel sounds present and normal.  No audible abdominal bruit.  GU:  No pain to palpation of testicles.      EXTREMITIES:  No increased edema present.  DP pulses palpable and equal bilaterally.         Assessment & Plan:  HEALTH MAINTENANCE.  Prostate checks and PSAs performed through urology.   Physical today.  Colonoscopy - normal per pt.  States recommended f/u colonoscopy in 10 years after last.  Colonoscopy due 2015.  Discussed with pt.     I spent 25 minutes with the patient and more than 50% of the time was spent in consultation regarding the above.

## 2014-01-28 NOTE — Progress Notes (Signed)
Pre visit review using our clinic review tool, if applicable. No additional management support is needed unless otherwise documented below in the visit note. 

## 2014-02-02 ENCOUNTER — Encounter: Payer: Self-pay | Admitting: Internal Medicine

## 2014-02-02 DIAGNOSIS — R1032 Left lower quadrant pain: Secondary | ICD-10-CM | POA: Insufficient documentation

## 2014-02-02 NOTE — Assessment & Plan Note (Signed)
On allegra.  Saline nasal flushes - continue.

## 2014-02-02 NOTE — Assessment & Plan Note (Signed)
History of squamous cell cancer and melanoma.  Sees Dr Evorn Gong and Dr Manley Mason [facility]).  Stable.  Follow.  Has f/u with Dr Evorn Gong 05/06/14.

## 2014-02-02 NOTE — Assessment & Plan Note (Signed)
Sees Dr Gaynelle Arabian [facility]).  On current med regimen.  Follow.  Just evaluated.

## 2014-02-02 NOTE — Assessment & Plan Note (Signed)
Persistent pain as outlined.  Discussed further w/up today.  Just saw his urologist.  Per pt, was questioning a possible small hernia.  Unable to palpate.  He is due to follow up with his urologist 02/03/14.  Wants to discuss more with him.  If persistent pain, may need CT.  Pt aware.  To call me after urology appt.

## 2014-02-02 NOTE — Assessment & Plan Note (Signed)
Intermittent flares.  Has been instructed to use stool softeners and fiber pills.  Follow.

## 2014-02-06 ENCOUNTER — Other Ambulatory Visit: Payer: Self-pay | Admitting: Urology

## 2014-02-07 ENCOUNTER — Telehealth: Payer: Self-pay

## 2014-02-07 NOTE — Telephone Encounter (Signed)
Pt states that he still doesn't know what is wrong. And thinks it would make more sense to do the CT before scheduling a surgery.

## 2014-02-07 NOTE — Telephone Encounter (Signed)
We discussed a CT at the last appt.  He is wanting me to go ahead and schedule or does he want to wait until Dr Hartley Barefoot completes w/up.  We discussed waiting to see what he said at this appt.  Just let me know.

## 2014-02-07 NOTE — Telephone Encounter (Signed)
Saw. Dr. Gaynelle Arabian on Monday, has scheduled prostate surgery on 03/17/14. Continues to have ongoing bilateral groin pain. States Dr. Era Bumpers did not address this at this visit, states enlarged prostate not the cause of his pain. Had cystoscopy done at that time as well. Sent for office notes. Last office note mentioned CT for ongoing pain, unchanged since visit 01/28/14

## 2014-02-07 NOTE — Telephone Encounter (Signed)
The patient called and is concerned about his ongoing groin pain.  He states that he has a scan done, however, he was under the impression other scans would be ordered.  He states he is having surgery in November to remove his prostate, but is hesitant to go forward with the surgery until his groin pain is diagnosed.    Pt callback - [phone]

## 2014-02-09 ENCOUNTER — Other Ambulatory Visit: Payer: Self-pay | Admitting: Internal Medicine

## 2014-02-09 DIAGNOSIS — R1032 Left lower quadrant pain: Secondary | ICD-10-CM

## 2014-02-09 NOTE — Progress Notes (Signed)
Order placed for ct abdomen and pelvis.

## 2014-02-09 NOTE — Telephone Encounter (Signed)
Notify him that I will schedule the CT scan, but I would like to (and need to) get lab results back before he has the scan.  Need to make sure kidney function ok.  It appears he is coming in 02/11/14.  Will have Helen schedule for after this date.

## 2014-02-10 NOTE — Telephone Encounter (Signed)
The patient is scheduled at 815 on 10-22 for CT

## 2014-02-11 ENCOUNTER — Other Ambulatory Visit (INDEPENDENT_AMBULATORY_CARE_PROVIDER_SITE_OTHER): Payer: Commercial Managed Care - HMO

## 2014-02-11 ENCOUNTER — Telehealth: Payer: Self-pay | Admitting: *Deleted

## 2014-02-11 DIAGNOSIS — Z13220 Encounter for screening for lipoid disorders: Secondary | ICD-10-CM

## 2014-02-11 DIAGNOSIS — N400 Enlarged prostate without lower urinary tract symptoms: Secondary | ICD-10-CM

## 2014-02-11 DIAGNOSIS — R1032 Left lower quadrant pain: Secondary | ICD-10-CM

## 2014-02-11 LAB — BASIC METABOLIC PANEL
BUN: 18 mg/dL (ref 6–23)
CHLORIDE: 104 meq/L (ref 96–112)
CO2: 32 mEq/L (ref 19–32)
Calcium: 9.2 mg/dL (ref 8.4–10.5)
Creatinine, Ser: 1.1 mg/dL (ref 0.4–1.5)
GFR: 73.01 mL/min (ref >60.00)
Glucose, Bld: 97 mg/dL (ref 70–99)
POTASSIUM: 3.8 meq/L (ref 3.5–5.1)
Sodium: 140 mEq/L (ref 135–145)

## 2014-02-11 LAB — HEPATIC FUNCTION PANEL
ALT: 18 U/L (ref 0–53)
AST: 22 U/L (ref 0–37)
Albumin: 3.9 g/dL (ref 3.5–5.2)
Alkaline Phosphatase: 53 U/L (ref 39–117)
BILIRUBIN TOTAL: 0.7 mg/dL (ref 0.2–1.2)
Bilirubin, Direct: 0.1 mg/dL (ref 0.0–0.3)
Total Protein: 7.2 g/dL (ref 6.0–8.3)

## 2014-02-11 LAB — LIPID PANEL
CHOLESTEROL: 187 mg/dL (ref 0–200)
HDL: 35.5 mg/dL — ABNORMAL LOW (ref >39.00)
LDL CALC: 138 mg/dL — AB (ref 0–99)
NonHDL: 151.5
Total CHOL/HDL Ratio: 5
Triglycerides: 69 mg/dL (ref 0.0–149.0)
VLDL: 13.8 mg/dL (ref 0.0–40.0)

## 2014-02-11 LAB — TSH: TSH: 1.48 u[IU]/mL (ref 0.35–4.50)

## 2014-02-11 LAB — CBC WITH DIFFERENTIAL/PLATELET
BASOS ABS: 0.1 10*3/uL (ref 0.0–0.1)
Basophils Relative: 0.8 % (ref 0.0–3.0)
EOS ABS: 0.2 10*3/uL (ref 0.0–0.7)
Eosinophils Relative: 2.3 % (ref 0.0–5.0)
HCT: 42.8 % (ref 39.0–52.0)
Hemoglobin: 14.1 g/dL (ref 13.0–17.0)
LYMPHS PCT: 25.5 % (ref 12.0–46.0)
Lymphs Abs: 1.8 10*3/uL (ref 0.7–4.0)
MCHC: 32.9 g/dL (ref 30.0–36.0)
MCV: 92.1 fl (ref 78.0–100.0)
Monocytes Absolute: 0.5 10*3/uL (ref 0.1–1.0)
Monocytes Relative: 6.4 % (ref 3.0–12.0)
NEUTROS PCT: 65 % (ref 43.0–77.0)
Neutro Abs: 4.6 10*3/uL (ref 1.4–7.7)
Platelets: 221 10*3/uL (ref 150.0–400.0)
RBC: 4.65 Mil/uL (ref 4.22–5.81)
RDW: 13.5 % (ref 11.5–15.5)
WBC: 7.1 10*3/uL (ref 4.0–10.5)

## 2014-02-11 NOTE — Telephone Encounter (Signed)
Pt has [employer].

## 2014-02-11 NOTE — Telephone Encounter (Signed)
Order placed

## 2014-02-11 NOTE — Telephone Encounter (Signed)
Pt is staying her needs a referral with Dr. Tresa Moore( [facility] in [place]), pt appt is Monday Nov. 2nd (consult)

## 2014-02-11 NOTE — Telephone Encounter (Signed)
Order placed for referral to [facility] (Dr Tresa Moore)

## 2014-02-12 ENCOUNTER — Encounter: Payer: Self-pay | Admitting: *Deleted

## 2014-02-21 ENCOUNTER — Encounter: Payer: Self-pay | Admitting: Internal Medicine

## 2014-02-21 ENCOUNTER — Ambulatory Visit: Payer: Self-pay | Admitting: Internal Medicine

## 2014-02-25 ENCOUNTER — Other Ambulatory Visit: Payer: Self-pay | Admitting: Urology

## 2014-03-04 ENCOUNTER — Encounter: Payer: Self-pay | Admitting: Internal Medicine

## 2014-03-04 ENCOUNTER — Ambulatory Visit (INDEPENDENT_AMBULATORY_CARE_PROVIDER_SITE_OTHER): Payer: Commercial Managed Care - HMO | Admitting: Internal Medicine

## 2014-03-04 ENCOUNTER — Telehealth: Payer: Self-pay | Admitting: Internal Medicine

## 2014-03-04 VITALS — BP 124/80 | HR 53 | Temp 97.9°F | Ht 69.25 in | Wt 175.5 lb

## 2014-03-04 DIAGNOSIS — Z01810 Encounter for preprocedural cardiovascular examination: Secondary | ICD-10-CM

## 2014-03-04 DIAGNOSIS — N400 Enlarged prostate without lower urinary tract symptoms: Secondary | ICD-10-CM

## 2014-03-04 DIAGNOSIS — I723 Aneurysm of iliac artery: Secondary | ICD-10-CM

## 2014-03-04 DIAGNOSIS — Z01818 Encounter for other preprocedural examination: Secondary | ICD-10-CM

## 2014-03-04 DIAGNOSIS — R1032 Left lower quadrant pain: Secondary | ICD-10-CM

## 2014-03-04 NOTE — Telephone Encounter (Signed)
Notify pt that his CT scan reveals no acute inflammatory process of the abdomen or pelvis.  The prostate gland does appear enlarged and appears to be pressing on the bladder base.  He does need to f/u with his urologist regarding further w/up for this.  No evidence of colitis or diverticulitis.  There is some dilation of the arteries in the pelvic area.  These will just need to be monitored.  I would like to refer him to vascular surgery for evaluation and monitoring just to confirm no change.  Need to forward the CT report to his urologist and let them know of finding and that the report is coming.  Thanks.  Let me know if any problems.

## 2014-03-04 NOTE — Progress Notes (Signed)
Pre visit review using our clinic review tool, if applicable. No additional management support is needed unless otherwise documented below in the visit note. 

## 2014-03-04 NOTE — Telephone Encounter (Signed)
Report was reviewed in office with patient & copy of report faxed to [facility]

## 2014-03-07 ENCOUNTER — Encounter: Payer: Self-pay | Admitting: Internal Medicine

## 2014-03-07 DIAGNOSIS — Z01818 Encounter for other preprocedural examination: Secondary | ICD-10-CM | POA: Insufficient documentation

## 2014-03-07 NOTE — Progress Notes (Signed)
Subjective:    [name], male    DOB: [birth year], 68 y.o.   MRN: [id number]  HPI 68 year old male with past history of an enlarged prostate followed by Dr Hartley Barefoot, allergies and arthritis.  He comes in today for a scheduled follow up.   States he is doing relatively well.  Lost his job recently.  Now working another job.  This is going well.  Decreased stress.  No significant allergy problems reported.  No cough or congestion.  No sob.    No chest pain or tightness.  Tries to stay active.  He has had problems with urinary hesitancy, etc.  Has an enlarged prostate.  Has been seeing Dr Gaynelle Arabian.   Just recently evaluated.  reported increased left lower abdominal pain.  Had CT scan that revealed no acute inflammatory process of the abdomen or pelvis.  No hydronephrosis or hydroureter.  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  Markedly enlarged prostate gland with polypoid indentation of the urinary bladder base.  No small bowel obstruction.  No diverticulitis.  He subsequently saw Dr Tresa Moore and is planning to have prostate surgery 03/19/14.  No nausea or vomiting.  Bowels stable.     Past Medical History  Diagnosis Date  . H/O sleep apnea   . Allergy     Chronic  . Hemorrhoids   . Diverticulosis 10/1996    Seen on flexible sigmoidoscopy  . BPH with elevated PSA   . Cancer     melenoma  . Cancer     Squamous  . Nephritis     age 70    Outpatient Encounter Prescriptions as of 03/04/2014  Medication Sig  . alfuzosin (UROXATRAL) 10 MG 24 hr tablet Take 10 mg by mouth daily.  . fexofenadine (ALLEGRA) 180 MG tablet Take 1 tablet (180 mg total) by mouth daily as needed for allergies or rhinitis.  . hydrocortisone (ANUSOL-HC) 2.5 % rectal cream Place 1 application rectally daily.  . meloxicam (MOBIC) 15 MG tablet Take 15 mg by mouth daily as needed.   . Multiple Vitamins-Minerals-FA (UDAMIN SP PO) Take 1 tablet  by mouth daily.   Marland Kitchen oxybutynin (DITROPAN-XL) 10 MG 24 hr tablet Take 10 mg by mouth daily.    Review of Systems Patient denies any headache, lightheadedness or dizziness.  On allegra for allergies.   No chest pain, tightness or palpitations.  No increased shortness of breath, cough or congestion.  No nausea or vomiting.  No acid reflux.  persistent left lower quadrant pain as outlined.  No bowel change, such as diarrhea, constipation, BRBPR or melana.   Urinary issues as outlined.  Stays active.  Rides his bike.  Had colonoscopy.  Sates was told he needed f/u colonoscopy in 10 years from last. CT scan as outlined.  Enlarged prostate.  Planning for surgery.       Objective:   Physical Exam  Filed Vitals:   03/04/14 0907  BP: 124/80  Pulse: 53  Temp: 97.9 F (36.6 C)   Blood pressure recheck:  75/12  68 year old male in no acute distress.  HEENT:  Nares - clear.  Oropharynx - without lesions. NECK:  Supple.  Nontender.  No audible carotid bruit.  HEART:  Appears to be regular.   LUNGS:  No crackles or wheezing audible.  Respirations even and unlabored.   RADIAL PULSE:  Equal bilaterally.  ABDOMEN:  Soft. Minimal tenderness to palpation -  lower quadrant.   Bowel sounds present and normal.  No audible abdominal bruit.     EXTREMITIES:  No increased edema present.  DP pulses palpable and equal bilaterally.         Assessment & Plan:  Enlarged prostate CT as outlined.  Seeing Dr Tresa Moore now.  Planning for surgery.    Left lower quadrant pain CT as outlined.  Enlarged prostate - compressing the bladder.  Given CT findings, feel this is probably contributing to the pain.  Surgery as outlined.  Follow. Confirm resolves after surgery.    Pre-op evaluation Pt tries to stay active.  Reports no chest pain or tightness with increased activity or exertion.  No sob.  EKG reveals SR/SB with ventricular rate of 52.  No acute ischemic changes.  Given asymptomatic, I feel he is at low risk from a  cardiac standpoint to proceed with the planned surgery.  He will need close intra op and post op monitoring of his heart rate and blood pressure to avoid extremes.    ILIAC ARTERY ANEURYSM.  BILATERAL.  Given this finding, will have vascular surgery review and follow.    HEALTH MAINTENANCE.  Prostate checks and PSAs performed through urology.   Physical last visit. Colonoscopy - normal per pt.  States recommended f/u colonoscopy in 10 years after last.  Colonoscopy due 2015.  Have discussed with pt.    I spent 25 minutes with the patient and more than 50% of the time was spent in consultation regarding the above.

## 2014-03-11 ENCOUNTER — Other Ambulatory Visit (HOSPITAL_COMMUNITY): Payer: Self-pay | Admitting: *Deleted

## 2014-03-11 NOTE — Patient Instructions (Addendum)
Kyle Meadows  03/11/2014                           YOUR PROCEDURE IS SCHEDULED ON:  03/19/14                ENTER FROM [address] - GO TO PARKING DECK               LOOK FOR [employer]  / [employer]                              FOLLOW  SIGNS TO SHORT STAY CENTER                 ARRIVE AT SHORT STAY AT:  10:45 AM               CALL THIS NUMBER IF ANY PROBLEMS THE DAY OF SURGERY :               [phone]                                REMEMBER:   Do not eat food or drink liquids AFTER MIDNIGHT                  Take these medicines the morning of surgery with               A SIPS OF WATER :    Oxybutynin / Uroxatrol / allegra      Do not wear jewelry, make-up   Do not wear lotions, powders, or perfumes.   Do not shave legs or underarms 12 hrs. before surgery (men may shave face)  Do not bring valuables to the hospital.  Contacts, dentures or bridgework may not be worn into surgery.  Leave suitcase in the car. After surgery it may be brought to your room.  For patients admitted to the hospital more than one night, checkout time is            11:00 AM                                                      ________________________________________________________________________                                                                                                  Kyle Meadows  Before surgery, you can play an important role.  Because skin is not sterile, your skin needs to be as free of germs as possible.  You can reduce the number of germs on your skin by washing with CHG (chlorahexidine gluconate) soap before surgery.  CHG is an antiseptic cleaner which kills germs and bonds with the skin to continue killing germs even after washing. Please  DO NOT use if you have an allergy to CHG or antibacterial soaps.  If your skin becomes reddened/irritated stop using the CHG and inform your nurse when you arrive at Short Stay. Do not shave  (including legs and underarms) for at least 48 hours prior to the first CHG shower.  You may shave your face. Please follow these instructions carefully:   1.  Shower with CHG Soap the night before surgery and the  morning of Surgery.   2.  If you choose to wash your hair, wash your hair first as usual with your  normal  Shampoo.   3.  After you shampoo, rinse your hair and body thoroughly to remove the  shampoo.                                         4.  Use CHG as you would any other liquid soap.  You can apply chg directly  to the skin and wash . Gently wash with scrungie or clean wascloth    5.  Apply the CHG Soap to your body ONLY FROM THE NECK DOWN.   Do not use on open                           Wound or open sores. Avoid contact with eyes, ears mouth and genitals (private parts).                        Genitals (private parts) with your normal soap.              6.  Wash thoroughly, paying special attention to the area where your surgery  will be performed.   7.  Thoroughly rinse your body with warm water from the neck down.   8.  DO NOT shower/wash with your normal soap after using and rinsing off  the CHG Soap .                9.  Pat yourself dry with a clean towel.             10.  Wear clean pajamas.             11.  Place clean sheets on your bed the night of your first shower and do not  sleep with pets.  Day of Surgery : Do not apply any lotions/deodorants the morning of surgery.  Please wear clean clothes to the hospital/surgery center.  FAILURE TO FOLLOW THESE INSTRUCTIONS MAY RESULT IN THE CANCELLATION OF YOUR SURGERY    PATIENT SIGNATURE_________________________________  ______________________________________________________________________

## 2014-03-12 ENCOUNTER — Encounter (HOSPITAL_COMMUNITY)
Admission: RE | Admit: 2014-03-12 | Discharge: 2014-03-12 | Disposition: A | Discharge status: Home / Self Care | Payer: Commercial Managed Care - HMO | Source: Ambulatory Visit | Attending: Urology | Admitting: Urology

## 2014-03-12 ENCOUNTER — Encounter (HOSPITAL_COMMUNITY): Payer: Self-pay

## 2014-03-12 DIAGNOSIS — Z01812 Encounter for preprocedural laboratory examination: Secondary | ICD-10-CM | POA: Diagnosis present

## 2014-03-12 HISTORY — DX: Unspecified osteoarthritis, unspecified site: M19.90

## 2014-03-12 HISTORY — DX: Frequency of micturition: R35.0

## 2014-03-12 LAB — CBC
HCT: 42.2 % (ref 39.0–52.0)
Hemoglobin: 14.3 g/dL (ref 13.0–17.0)
MCH: 30.6 pg (ref 26.0–34.0)
MCHC: 33.9 g/dL (ref 30.0–36.0)
MCV: 90.4 fL (ref 78.0–100.0)
Platelets: 233 10*3/uL (ref 150–400)
RBC: 4.67 MIL/uL (ref 4.22–5.81)
RDW: 13 % (ref 11.5–15.5)
WBC: 6.6 10*3/uL (ref 4.0–10.5)

## 2014-03-12 LAB — BASIC METABOLIC PANEL
ANION GAP: 9 (ref 5–15)
BUN: 19 mg/dL (ref 6–23)
CO2: 30 mEq/L (ref 19–32)
CREATININE: 1.04 mg/dL (ref 0.50–1.35)
Calcium: 9.7 mg/dL (ref 8.4–10.5)
Chloride: 101 mEq/L (ref 96–112)
GFR calc non Af Amer: 72 mL/min — ABNORMAL LOW (ref >90)
GFR, EST AFRICAN AMERICAN: 83 mL/min — AB (ref >90)
Glucose, Bld: 93 mg/dL (ref 70–99)
Potassium: 4.6 mEq/L (ref 3.7–5.3)
SODIUM: 140 meq/L (ref 137–147)

## 2014-03-17 ENCOUNTER — Inpatient Hospital Stay (HOSPITAL_COMMUNITY): Admission: RE | Admit: 2014-03-17 | Payer: Self-pay | Source: Ambulatory Visit | Admitting: Urology

## 2014-03-17 ENCOUNTER — Encounter (HOSPITAL_COMMUNITY): Admission: RE | Payer: Self-pay | Source: Ambulatory Visit

## 2014-03-17 SURGERY — PROSTATECTOMY, SUPRAPUBIC APPROACH
Anesthesia: General

## 2014-03-19 ENCOUNTER — Inpatient Hospital Stay (HOSPITAL_COMMUNITY): Payer: Medicare HMO | Admitting: Anesthesiology

## 2014-03-19 ENCOUNTER — Encounter (HOSPITAL_COMMUNITY)
Admission: RE | Disposition: A | Discharge status: Home / Self Care | Payer: Self-pay | Source: Ambulatory Visit | Attending: Urology

## 2014-03-19 ENCOUNTER — Inpatient Hospital Stay (HOSPITAL_COMMUNITY)
Admission: RE | Admit: 2014-03-19 | Discharge: 2014-03-20 | DRG: 708 | Disposition: A | Discharge status: Home / Self Care | Payer: Medicare HMO | Source: Ambulatory Visit | Attending: Urology | Admitting: Urology

## 2014-03-19 ENCOUNTER — Encounter (HOSPITAL_COMMUNITY): Payer: Self-pay | Admitting: *Deleted

## 2014-03-19 DIAGNOSIS — Z79899 Other long term (current) drug therapy: Secondary | ICD-10-CM

## 2014-03-19 DIAGNOSIS — M1990 Unspecified osteoarthritis, unspecified site: Secondary | ICD-10-CM | POA: Diagnosis present

## 2014-03-19 DIAGNOSIS — Z85820 Personal history of malignant melanoma of skin: Secondary | ICD-10-CM | POA: Diagnosis not present

## 2014-03-19 DIAGNOSIS — N138 Other obstructive and reflux uropathy: Secondary | ICD-10-CM | POA: Diagnosis present

## 2014-03-19 DIAGNOSIS — N400 Enlarged prostate without lower urinary tract symptoms: Secondary | ICD-10-CM | POA: Diagnosis present

## 2014-03-19 DIAGNOSIS — N401 Enlarged prostate with lower urinary tract symptoms: Principal | ICD-10-CM | POA: Diagnosis present

## 2014-03-19 DIAGNOSIS — R350 Frequency of micturition: Secondary | ICD-10-CM | POA: Diagnosis present

## 2014-03-19 HISTORY — PX: ROBOT ASSISTED LAPAROSCOPIC RADICAL PROSTATECTOMY: SHX5141

## 2014-03-19 LAB — HEMOGLOBIN AND HEMATOCRIT, BLOOD
HCT: 41.1 % (ref 39.0–52.0)
HEMOGLOBIN: 14 g/dL (ref 13.0–17.0)

## 2014-03-19 SURGERY — XI ROBOTIC ASSISTED LAPAROSCOPIC RADICAL PROSTATECTOMY LEVEL 1
Anesthesia: General

## 2014-03-19 MED ORDER — ROCURONIUM BROMIDE 100 MG/10ML IV SOLN
INTRAVENOUS | Status: DC | PRN
  Administered 2014-03-19 (×2): 10 mg via INTRAVENOUS
  Administered 2014-03-19: 50 mg via INTRAVENOUS

## 2014-03-19 MED ORDER — SENNA 8.6 MG PO TABS
1.0000 | ORAL_TABLET | Freq: Two times a day (BID) | ORAL | Status: DC
  Administered 2014-03-19 – 2014-03-20 (×2): 8.6 mg via ORAL
  Filled 2014-03-19 (×2): qty 1

## 2014-03-19 MED ORDER — SULFAMETHOXAZOLE-TRIMETHOPRIM 800-160 MG PO TABS: 1.0000 | ORAL_TABLET | Freq: Two times a day (BID) | ORAL | Status: DC

## 2014-03-19 MED ORDER — FENTANYL CITRATE 0.05 MG/ML IJ SOLN
INTRAMUSCULAR | Status: DC | PRN
  Administered 2014-03-19 (×5): 50 ug via INTRAVENOUS

## 2014-03-19 MED ORDER — PROPOFOL 10 MG/ML IV BOLUS
INTRAVENOUS | Status: DC | PRN
  Administered 2014-03-19: 200 mg via INTRAVENOUS

## 2014-03-19 MED ORDER — GLYCOPYRROLATE 0.2 MG/ML IJ SOLN
INTRAMUSCULAR | Status: DC | PRN
  Administered 2014-03-19: 0.6 mg via INTRAVENOUS

## 2014-03-19 MED ORDER — DEXAMETHASONE SODIUM PHOSPHATE 10 MG/ML IJ SOLN
INTRAMUSCULAR | Status: AC
  Filled 2014-03-19: qty 1

## 2014-03-19 MED ORDER — HYDROMORPHONE HCL 1 MG/ML IJ SOLN
0.2500 mg | INTRAMUSCULAR | Status: DC | PRN
  Administered 2014-03-19 (×2): 0.5 mg via INTRAVENOUS
  Administered 2014-03-19: 0.25 mg via INTRAVENOUS
  Administered 2014-03-19: 0.5 mg via INTRAVENOUS
  Administered 2014-03-19: 0.25 mg via INTRAVENOUS

## 2014-03-19 MED ORDER — CEFAZOLIN SODIUM-DEXTROSE 2-3 GM-% IV SOLR
INTRAVENOUS | Status: AC
  Filled 2014-03-19: qty 50

## 2014-03-19 MED ORDER — OXYCODONE HCL 5 MG/5ML PO SOLN
5.0000 mg | Freq: Once | ORAL | Status: DC | PRN
  Filled 2014-03-19: qty 5

## 2014-03-19 MED ORDER — HYDROMORPHONE HCL 1 MG/ML IJ SOLN
0.5000 mg | INTRAMUSCULAR | Status: DC | PRN
  Administered 2014-03-19: 1 mg via INTRAVENOUS
  Filled 2014-03-19: qty 1

## 2014-03-19 MED ORDER — FENTANYL CITRATE 0.05 MG/ML IJ SOLN
INTRAMUSCULAR | Status: AC
  Filled 2014-03-19: qty 5

## 2014-03-19 MED ORDER — CEFAZOLIN SODIUM-DEXTROSE 2-3 GM-% IV SOLR
2.0000 g | INTRAVENOUS | Status: AC
  Administered 2014-03-19: 2 g via INTRAVENOUS

## 2014-03-19 MED ORDER — GLYCOPYRROLATE 0.2 MG/ML IJ SOLN
INTRAMUSCULAR | Status: AC
  Filled 2014-03-19: qty 3

## 2014-03-19 MED ORDER — LIDOCAINE HCL (CARDIAC) 20 MG/ML IV SOLN
INTRAVENOUS | Status: DC | PRN
  Administered 2014-03-19: 100 mg via INTRAVENOUS

## 2014-03-19 MED ORDER — SODIUM CHLORIDE 0.9 % IJ SOLN
INTRAMUSCULAR | Status: AC
  Filled 2014-03-19: qty 20

## 2014-03-19 MED ORDER — PROMETHAZINE HCL 25 MG/ML IJ SOLN: 6.2500 mg | INTRAMUSCULAR | Status: DC | PRN

## 2014-03-19 MED ORDER — MIDAZOLAM HCL 2 MG/2ML IJ SOLN
INTRAMUSCULAR | Status: AC
  Filled 2014-03-19: qty 2

## 2014-03-19 MED ORDER — HYDROMORPHONE HCL 1 MG/ML IJ SOLN
INTRAMUSCULAR | Status: AC
  Filled 2014-03-19: qty 1

## 2014-03-19 MED ORDER — MIDAZOLAM HCL 5 MG/5ML IJ SOLN
INTRAMUSCULAR | Status: DC | PRN
  Administered 2014-03-19: 2 mg via INTRAVENOUS

## 2014-03-19 MED ORDER — ACETAMINOPHEN 10 MG/ML IV SOLN
1000.0000 mg | Freq: Four times a day (QID) | INTRAVENOUS | Status: DC
  Filled 2014-03-19 (×4): qty 100

## 2014-03-19 MED ORDER — OXYCODONE-ACETAMINOPHEN 5-325 MG PO TABS: 1.0000 | ORAL_TABLET | Freq: Four times a day (QID) | ORAL | Status: DC | PRN

## 2014-03-19 MED ORDER — STERILE WATER FOR IRRIGATION IR SOLN
Status: DC | PRN
  Administered 2014-03-19: 1500 mL

## 2014-03-19 MED ORDER — OXYBUTYNIN CHLORIDE 5 MG PO TABS
5.0000 mg | ORAL_TABLET | Freq: Three times a day (TID) | ORAL | Status: DC | PRN
  Administered 2014-03-19: 5 mg via ORAL
  Filled 2014-03-19 (×3): qty 1

## 2014-03-19 MED ORDER — DEXAMETHASONE SODIUM PHOSPHATE 10 MG/ML IJ SOLN
INTRAMUSCULAR | Status: DC | PRN
  Administered 2014-03-19: 10 mg via INTRAVENOUS

## 2014-03-19 MED ORDER — OXYCODONE HCL 5 MG PO TABS: 5.0000 mg | ORAL_TABLET | ORAL | Status: DC | PRN

## 2014-03-19 MED ORDER — KCL IN DEXTROSE-NACL 20-5-0.45 MEQ/L-%-% IV SOLN
INTRAVENOUS | Status: DC
  Administered 2014-03-19 – 2014-03-20 (×2): via INTRAVENOUS
  Filled 2014-03-19 (×2): qty 1000

## 2014-03-19 MED ORDER — LACTATED RINGERS IV SOLN
INTRAVENOUS | Status: DC
  Administered 2014-03-19: 1000 mL via INTRAVENOUS
  Administered 2014-03-19: 16:00:00 via INTRAVENOUS

## 2014-03-19 MED ORDER — ACETAMINOPHEN 500 MG PO TABS
1000.0000 mg | ORAL_TABLET | Freq: Three times a day (TID) | ORAL | Status: DC
  Administered 2014-03-20 (×2): 1000 mg via ORAL
  Filled 2014-03-19 (×2): qty 2

## 2014-03-19 MED ORDER — BUPIVACAINE LIPOSOME 1.3 % IJ SUSP
20.0000 mL | Freq: Once | INTRAMUSCULAR | Status: DC
  Filled 2014-03-19: qty 20

## 2014-03-19 MED ORDER — BUPIVACAINE LIPOSOME 1.3 % IJ SUSP
INTRAMUSCULAR | Status: DC | PRN
  Administered 2014-03-19: 20 mL

## 2014-03-19 MED ORDER — MEPERIDINE HCL 50 MG/ML IJ SOLN: 6.2500 mg | INTRAMUSCULAR | Status: DC | PRN

## 2014-03-19 MED ORDER — ONDANSETRON HCL 4 MG/2ML IJ SOLN: 4.0000 mg | INTRAMUSCULAR | Status: DC | PRN

## 2014-03-19 MED ORDER — MELOXICAM 15 MG PO TABS
15.0000 mg | ORAL_TABLET | Freq: Every day | ORAL | Status: DC | PRN
  Administered 2014-03-19: 15 mg via ORAL
  Filled 2014-03-19 (×2): qty 1

## 2014-03-19 MED ORDER — SENNOSIDES-DOCUSATE SODIUM 8.6-50 MG PO TABS
1.0000 | ORAL_TABLET | Freq: Two times a day (BID) | ORAL | Status: DC
Start: 2014-03-19 — End: 2014-05-07

## 2014-03-19 MED ORDER — LACTATED RINGERS IR SOLN
Status: DC | PRN
  Administered 2014-03-19: 1000 mL

## 2014-03-19 MED ORDER — SODIUM CHLORIDE 0.9 % IJ SOLN
INTRAMUSCULAR | Status: DC | PRN
  Administered 2014-03-19: 20 mL via INTRAVENOUS

## 2014-03-19 MED ORDER — ROCURONIUM BROMIDE 100 MG/10ML IV SOLN
INTRAVENOUS | Status: AC
  Filled 2014-03-19: qty 2

## 2014-03-19 MED ORDER — LIDOCAINE HCL (CARDIAC) 20 MG/ML IV SOLN
INTRAVENOUS | Status: AC
  Filled 2014-03-19: qty 5

## 2014-03-19 MED ORDER — SODIUM CHLORIDE 0.9 % IV BOLUS (SEPSIS)
1000.0000 mL | Freq: Once | INTRAVENOUS | Status: AC
  Administered 2014-03-19: 1000 mL via INTRAVENOUS

## 2014-03-19 MED ORDER — SUCCINYLCHOLINE CHLORIDE 20 MG/ML IJ SOLN
INTRAMUSCULAR | Status: DC | PRN
  Administered 2014-03-19: 100 mg via INTRAVENOUS

## 2014-03-19 MED ORDER — ONDANSETRON HCL 4 MG/2ML IJ SOLN
INTRAMUSCULAR | Status: AC
  Filled 2014-03-19: qty 2

## 2014-03-19 MED ORDER — ONDANSETRON HCL 4 MG/2ML IJ SOLN
INTRAMUSCULAR | Status: DC | PRN
  Administered 2014-03-19: 4 mg via INTRAVENOUS

## 2014-03-19 MED ORDER — OXYCODONE HCL 5 MG PO TABS: 5.0000 mg | ORAL_TABLET | Freq: Once | ORAL | Status: DC | PRN

## 2014-03-19 MED ORDER — NEOSTIGMINE METHYLSULFATE 10 MG/10ML IV SOLN
INTRAVENOUS | Status: DC | PRN
  Administered 2014-03-19: 3.5 mg via INTRAVENOUS

## 2014-03-19 MED ORDER — DOCUSATE SODIUM 100 MG PO CAPS
100.0000 mg | ORAL_CAPSULE | Freq: Two times a day (BID) | ORAL | Status: DC
  Administered 2014-03-19 – 2014-03-20 (×2): 100 mg via ORAL
  Filled 2014-03-19 (×3): qty 1

## 2014-03-19 MED ORDER — PROPOFOL 10 MG/ML IV BOLUS
INTRAVENOUS | Status: AC
  Filled 2014-03-19: qty 20

## 2014-03-19 MED ORDER — PHENYLEPHRINE 40 MCG/ML (10ML) SYRINGE FOR IV PUSH (FOR BLOOD PRESSURE SUPPORT)
PREFILLED_SYRINGE | INTRAVENOUS | Status: AC
  Filled 2014-03-19: qty 10

## 2014-03-19 SURGICAL SUPPLY — 68 items
CABLE HIGH FREQUENCY MONO STRZ (ELECTRODE) IMPLANT
CANISTER SUCT 3000ML (MISCELLANEOUS) IMPLANT
CATH FOLEY 2WAY SLVR 18FR 30CC (CATHETERS) ×2 IMPLANT
CATH HEMA 3WAY 30CC 22FR COUDE (CATHETERS) ×2 IMPLANT
CATH TIEMANN FOLEY 18FR 5CC (CATHETERS) ×2 IMPLANT
CHLORAPREP W/TINT 26ML (MISCELLANEOUS) ×2 IMPLANT
CLIP LIGATING HEM O LOK PURPLE (MISCELLANEOUS) ×4 IMPLANT
CLOTH BEACON ORANGE TIMEOUT ST (SAFETY) ×2 IMPLANT
CONT SPEC 4OZ CLIKSEAL STRL BL (MISCELLANEOUS) ×2 IMPLANT
COVER MAYO STAND STRL (DRAPES) ×2 IMPLANT
COVER SURGICAL LIGHT HANDLE (MISCELLANEOUS) IMPLANT
COVER TIP SHEARS 8 DVNC (MISCELLANEOUS) ×1 IMPLANT
COVER TIP SHEARS 8MM DA VINCI (MISCELLANEOUS) ×1
CUTTER ECHEON FLEX ENDO 45 340 (ENDOMECHANICALS) ×2 IMPLANT
CUTTER FLEX LINEAR 45M (STAPLE) ×2 IMPLANT
DECANTER SPIKE VIAL GLASS SM (MISCELLANEOUS) IMPLANT
DERMABOND ADVANCED (GAUZE/BANDAGES/DRESSINGS) ×1
DERMABOND ADVANCED .7 DNX12 (GAUZE/BANDAGES/DRESSINGS) ×1 IMPLANT
DRAPE ARM DVNC X/XI (DISPOSABLE) ×3 IMPLANT
DRAPE COLUMN DVNC XI (DISPOSABLE) ×1 IMPLANT
DRAPE DA VINCI XI ARM (DISPOSABLE) ×3
DRAPE DA VINCI XI COLUMN (DISPOSABLE) ×1
DRSG TEGADERM 4X4.75 (GAUZE/BANDAGES/DRESSINGS) ×2 IMPLANT
DRSG TEGADERM 6X8 (GAUZE/BANDAGES/DRESSINGS) IMPLANT
ELECT REM PT RETURN 9FT ADLT (ELECTROSURGICAL) ×2
ELECTRODE REM PT RTRN 9FT ADLT (ELECTROSURGICAL) ×1 IMPLANT
GAUZE SPONGE 2X2 8PLY STRL LF (GAUZE/BANDAGES/DRESSINGS) IMPLANT
GLOVE BIO SURGEON STRL SZ 6.5 (GLOVE) ×2 IMPLANT
GLOVE BIOGEL M STRL SZ7.5 (GLOVE) ×4 IMPLANT
GLOVE BIOGEL PI IND STRL 7.5 (GLOVE) IMPLANT
GLOVE BIOGEL PI INDICATOR 7.5 (GLOVE)
GOWN STRL REUS W/TWL LRG LVL4 (GOWN DISPOSABLE) ×6 IMPLANT
HOLDER FOLEY CATH W/STRAP (MISCELLANEOUS) ×2 IMPLANT
IV LACTATED RINGERS 1000ML (IV SOLUTION) ×2 IMPLANT
KIT ACCESSORY DA VINCI DISP (KITS)
KIT ACCESSORY DVNC DISP (KITS) IMPLANT
KIT PROCEDURE DA VINCI SI (MISCELLANEOUS) ×1
KIT PROCEDURE DVNC SI (MISCELLANEOUS) ×1 IMPLANT
LIQUID BAND (GAUZE/BANDAGES/DRESSINGS) ×2 IMPLANT
MANIFOLD NEPTUNE II (INSTRUMENTS) ×2 IMPLANT
NEEDLE INSUFFLATION 14GA 120MM (NEEDLE) ×2 IMPLANT
NEEDLE SPNL 22GX7 SPINOC (NEEDLE) IMPLANT
PACK ROBOT UROLOGY CUSTOM (CUSTOM PROCEDURE TRAY) ×2 IMPLANT
POUCH SPECIMEN RETRIEVAL 10MM (ENDOMECHANICALS) ×2 IMPLANT
RELOAD GREEN ECHELON 45 (STAPLE) ×2 IMPLANT
SEAL CANN UNIV 5-8 DVNC XI (MISCELLANEOUS) ×4 IMPLANT
SEAL XI 5MM-8MM UNIVERSAL (MISCELLANEOUS) ×4
SET TUBE IRRIG SUCTION NO TIP (IRRIGATION / IRRIGATOR) ×2 IMPLANT
SOLUTION ELECTROLUBE (MISCELLANEOUS) ×2 IMPLANT
SPONGE DRAIN TRACH 4X4 STRL 2S (GAUZE/BANDAGES/DRESSINGS) ×2 IMPLANT
SPONGE GAUZE 2X2 STER 10/PKG (GAUZE/BANDAGES/DRESSINGS)
SPONGE LAP 4X18 X RAY DECT (DISPOSABLE) ×2 IMPLANT
SUT ETHILON 3 0 PS 1 (SUTURE) ×2 IMPLANT
SUT MNCRL AB 4-0 PS2 18 (SUTURE) ×4 IMPLANT
SUT PDS AB 1 CT1 27 (SUTURE) ×4 IMPLANT
SUT V-LOC BARB 180 2/0GR6 GS22 (SUTURE) ×8
SUT VIC AB 2-0 SH 27 (SUTURE) ×1
SUT VIC AB 2-0 SH 27X BRD (SUTURE) ×1 IMPLANT
SUT VICRYL 0 UR6 27IN ABS (SUTURE) IMPLANT
SUT VLOC BARB 180 ABS3/0GR12 (SUTURE) ×2
SUTURE V-LC BRB 180 2/0GR6GS22 (SUTURE) ×4 IMPLANT
SUTURE VLOC BRB 180 ABS3/0GR12 (SUTURE) ×1 IMPLANT
SYR 27GX1/2 1ML LL SAFETY (SYRINGE) IMPLANT
TOWEL OR 17X26 10 PK STRL BLUE (TOWEL DISPOSABLE) ×2 IMPLANT
TOWEL OR NON WOVEN STRL DISP B (DISPOSABLE) ×2 IMPLANT
TROCAR 12M 150ML BLUNT (TROCAR) ×2 IMPLANT
TUBE FEEDING 5FR 36IN KANGAROO (TUBING) ×4 IMPLANT
WATER STERILE IRR 1500ML POUR (IV SOLUTION) ×2 IMPLANT

## 2014-03-19 NOTE — Anesthesia Postprocedure Evaluation (Signed)
Anesthesia Post Note  Patient: Kyle Meadows  Procedure(s) Performed: Procedure(s) (LRB): ROBOTIC ASSISTED LAPAROSCOPIC SIMPLE PROSTATECTOMY (N/A)  Anesthesia type: General  Patient location: PACU  Post pain: Pain level controlled  Post assessment: Post-op Vital signs reviewed  Last Vitals: BP 163/84 mmHg  Pulse 86  Temp(Src) 36.6 C (Oral)  Resp 12  Ht 5\' 9"  (1.753 m)  Wt 175 lb 4 oz (79.493 kg)  BMI 25.87 kg/m2  SpO2 96%  Post vital signs: Reviewed  Level of consciousness: sedated  Complications: No apparent anesthesia complications

## 2014-03-19 NOTE — Brief Op Note (Signed)
03/19/2014  3:50 PM  PATIENT:  Kyle Meadows  68 y.o. male  PRE-OPERATIVE DIAGNOSIS:  MASSIVE PROSTATE WITH URINARY OBSTRUCTION  POST-OPERATIVE DIAGNOSIS:  MASSIVE PROSTATE WITH URINARY OBSTRUCTION  PROCEDURE:  Procedure(s): ROBOTIC ASSISTED LAPAROSCOPIC SIMPLE PROSTATECTOMY (N/A)  SURGEON:  Surgeon(s) and Role:    * Alexis Frock, MD - Primary  PHYSICIAN ASSISTANT:   ASSISTANTS: Clemetine Marker PA   ANESTHESIA:   local and general  EBL:  Total I/O In: 1000 [I.V.:1000] Out: 350 [Urine:200; Blood:150]  BLOOD ADMINISTERED:none  DRAINS: 3 Way foley to straight drain, irrigation port plugged; JP to bulb suction   LOCAL MEDICATIONS USED:  MARCAINE     SPECIMEN:  Source of Specimen:  Prostate Adenoma  DISPOSITION OF SPECIMEN:  PATHOLOGY  COUNTS:  YES  TOURNIQUET:  * No tourniquets in log *  DICTATION: .Other Dictation: Dictation Number [id number]  PLAN OF CARE: Admit to inpatient   PATIENT DISPOSITION:  PACU - hemodynamically stable.   Delay start of Pharmacological VTE agent (>24hrs) due to surgical blood loss or risk of bleeding: yes

## 2014-03-19 NOTE — Anesthesia Preprocedure Evaluation (Addendum)
Anesthesia Evaluation  Patient identified by MRN, date of birth, ID band Patient awake    Reviewed: Allergy & Precautions, H&P , NPO status , Patient's Chart, lab work & pertinent test results  Airway Mallampati: II  TM Distance: >3 FB Neck ROM: Full    Dental no notable dental hx.    Pulmonary neg pulmonary ROS,  breath sounds clear to auscultation  Pulmonary exam normal       Cardiovascular negative cardio ROS  Rhythm:Regular Rate:Normal     Neuro/Psych negative neurological ROS  negative psych ROS   GI/Hepatic negative GI ROS, Neg liver ROS,   Endo/Other  negative endocrine ROS  Renal/GU Renal disease     Musculoskeletal  (+) Arthritis -,   Abdominal   Peds  Hematology negative hematology ROS (+)   Anesthesia Other Findings   Reproductive/Obstetrics negative OB ROS                             Anesthesia Physical Anesthesia Plan  ASA: II  Anesthesia Plan: General   Post-op Pain Management:    Induction: Intravenous  Airway Management Planned: Oral ETT  Additional Equipment: None  Intra-op Plan:   Post-operative Plan: Extubation in OR  Informed Consent: I have reviewed the patients History and Physical, chart, labs and discussed the procedure including the risks, benefits and alternatives for the proposed anesthesia with the patient or authorized representative who has indicated his/her understanding and acceptance.   Dental advisory given  Plan Discussed with: CRNA  Anesthesia Plan Comments:         Anesthesia Quick Evaluation

## 2014-03-19 NOTE — H&P (Signed)
Kyle Meadows is an 68 y.o. male.    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 One episode retention. TRUS 01/2014 155gm. CT 01/2014 estimates 220gm wtih large median lobe component. PSA's 2-8 range past few years with up and down pattern. PVR <100 mL x several.  PMH sig for lap right inguinal hernia repair (mesh) via umbilical approach. 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    Today Kyle Meadows is seen to proceed with robotic simple prostatectomy for his refractory outlet symptoms. No interval fevers. Most recent UA without infectious parameters.   Past Medical History  Diagnosis Date  . H/O sleep apnea     had surgery to resolve  . Hemorrhoids   . Diverticulosis 10/1996    Seen on flexible sigmoidoscopy  . BPH with elevated PSA   . Cancer     melenoma  . Cancer     Squamous  . Nephritis     age 55  . Allergy     Chronic  . Arthritis   . History of skin cancer   . OAB (overactive bladder)   . Frequency of urination     Past Surgical History  Procedure Laterality Date  . Uvulopalatopharyngoplasty      5 times  . Tonsillectomy    . Knee surgery      right x 2, torn meniscus  . Hernia repair      inguinal  . Wrist surgery      right, s/p trauma    Family History  Problem Relation Age of Onset  . Heart disease Father     CABG x 4  . Arthritis Father    Social History:  reports that he has never smoked. He has never used smokeless tobacco. He reports that he drinks alcohol. He reports that he does not use illicit drugs.  Allergies: No Known Allergies  No prescriptions prior to admission    No results found for this or any previous visit (from the past 48 hour(s)). No results found.  Review of Systems  Constitutional: Negative.  Negative for fever.  HENT: Negative.    Eyes: Negative.   Respiratory: Negative.   Cardiovascular: Negative.   Gastrointestinal: Negative.  Negative for nausea.  Genitourinary: Negative.   Musculoskeletal: Negative.   Skin: Negative.   Neurological: Negative.   Endo/Heme/Allergies: Negative.   Psychiatric/Behavioral: Negative.     There were no vitals taken for this visit. Physical Exam  Constitutional: He appears well-developed.  HENT:  Head: Normocephalic.  Eyes: Pupils are equal, round, and reactive to light.  Neck: Normal range of motion.  Cardiovascular: Normal rate.   Respiratory: Effort normal.  GI: Soft.  Genitourinary:  No CVAT  Musculoskeletal: Normal range of motion.  Neurological: He is alert.  Skin: Skin is warm.  Psychiatric: He has a normal mood and affect. His behavior is normal. Judgment and thought content normal.     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 I agree little role for further endoscopic management at such large gland size.   We rediscussed simple prostatectomy and specifically robotic simple prostatectomy being the technique that I most commonly perform. I showed the patient on their abdomen the approximately 6 small incision (trocar) sites as well as presumed extraction sites with robotic  approach as well as possible open incision sites should open conversion be necessary. Were discussed peri-operative risks including bleeding, infection, deep vein thrombosis, pulmonary embolism, compartment syndrome, nuropathy / neuropraxia, bladder neck contracture, damage to ureters, heart attack, stroke, death, as well as long-term risks such as non-cure / need for additional therapy. We specificallyre addressed that the procedure is the most definitive in terms of treatment for refractory BPH but that future regrowth is possible and that this operation does NOT obviate the need for future prostate cancer screening.  We  rediscussed the typical hospital course including usual 1-2 night hospitalization, discharge with foley catheter in place usually for 1-2 weeks before voiding trial as well as usually 2 week recovery until able to perform most non-strenuous activity and 6 weeks until able to return to most jobs and more strenuous activity such as exercise.   Pt voiced understanding and wants to proceed.     Jolanta Cabeza 03/19/2014, 5:53 AM

## 2014-03-19 NOTE — Transfer of Care (Signed)
Immediate Anesthesia Transfer of Care Note  Patient: Kyle Meadows  Procedure(s) Performed: Procedure(s) (LRB): ROBOTIC ASSISTED LAPAROSCOPIC SIMPLE PROSTATECTOMY (N/A)  Patient Location: PACU  Anesthesia Type: General  Level of Consciousness: sedated, patient cooperative and responds to stimulation  Airway & Oxygen Therapy: Patient Spontanous Breathing and Patient connected to face mask oxgen  Post-op Assessment: Report given to PACU RN and Post -op Vital signs reviewed and stable  Post vital signs: Reviewed and stable  Complications: No apparent anesthesia complications

## 2014-03-20 LAB — BASIC METABOLIC PANEL
Anion gap: 14 (ref 5–15)
BUN: 13 mg/dL (ref 6–23)
CALCIUM: 9 mg/dL (ref 8.4–10.5)
CO2: 25 mEq/L (ref 19–32)
CREATININE: 1.13 mg/dL (ref 0.50–1.35)
Chloride: 102 mEq/L (ref 96–112)
GFR calc Af Amer: 75 mL/min — ABNORMAL LOW (ref >90)
GFR calc non Af Amer: 65 mL/min — ABNORMAL LOW (ref >90)
GLUCOSE: 159 mg/dL — AB (ref 70–99)
Potassium: 4.1 mEq/L (ref 3.7–5.3)
Sodium: 141 mEq/L (ref 137–147)

## 2014-03-20 LAB — HEMOGLOBIN AND HEMATOCRIT, BLOOD
HCT: 40.7 % (ref 39.0–52.0)
Hemoglobin: 14 g/dL (ref 13.0–17.0)

## 2014-03-20 MED ORDER — TRAMADOL HCL 50 MG PO TABS: 50.0000 mg | ORAL_TABLET | Freq: Four times a day (QID) | ORAL | Status: DC | PRN

## 2014-03-20 NOTE — Progress Notes (Signed)
[name], male   DOB: 1945/12/08, 68 y.o.   MRN: [id number]  1 Day Post-Op Subjective: Pt doing well.  Pain controlled. Ambulating well.  Objective: Vital signs in last 24 hours: Temp:  [97.9 F (36.6 C)-98.4 F (36.9 C)] 98.4 F (36.9 C) (11/26 0532) Pulse Rate:  [82-97] 82 (11/26 0532) Resp:  [11-23] 18 (11/26 0532) BP: (127-163)/(65-84) 127/65 mmHg (11/26 0532) SpO2:  [94 %-100 %] 96 % (11/26 0532) Weight:  [79.493 kg (175 lb 4 oz)] 79.493 kg (175 lb 4 oz) (11/25 1051)  Intake/Output from previous day: 11/25 0701 - 11/26 0700 In: 4442.5 [P.O.:1120; I.V.:2322.5; IV Piggyback:1000] Out: 2730 [Urine:2525; Drains:55; Blood:150] Intake/Output this shift:    Physical Exam:  General: Alert and oriented CV: RRR Lungs: Clear Abdomen: Soft, ND, positive BS Incisions: C/D/I Ext: NT, No erythema  Lab Results:  Recent Labs  03/19/14 1632 03/20/14 0410  HGB 14.0 14.0  HCT 41.1 40.7   BMET  Recent Labs  03/20/14 0410  NA 141  K 4.1  CL 102  CO2 25  GLUCOSE 159*  BUN 13  CREATININE 1.13  CALCIUM 9.0     Studies/Results: No results found.  Assessment/Plan: - D/C drain - D/C home with catheter - Pt does not tolerate hydrocodone or oxycodone.  Will prescribe tramadol. - F/U with Dr. Tresa Meadows next week as planned.   LOS: 1 day   Kyle Meadows,LES 03/20/2014, 10:50 AM

## 2014-03-20 NOTE — Plan of Care (Signed)
Problem: Phase I Progression Outcomes Goal: Pain controlled with appropriate interventions Outcome: Completed/Met Date Met:  03/20/14 Goal: NPO except ice chips or as ordered Outcome: Not Applicable Date Met:  21/03/12 Goal: Foley/JP patent Outcome: Completed/Met Date Met:  03/20/14 Goal: Incision/dressing intact Outcome: Completed/Met Date Met:  03/20/14 Goal: Adequate I & O Outcome: Completed/Met Date Met:  03/20/14 Goal: Walk in halls when awake from anesthesia Outcome: Completed/Met Date Met:  03/20/14 Goal: Initial discharge plan identified Outcome: Completed/Met Date Met:  03/20/14 Goal: Other Phase I Outcomes/Goals Outcome: Not Applicable Date Met:  81/18/86

## 2014-03-20 NOTE — Discharge Instructions (Signed)

## 2014-03-20 NOTE — Op Note (Signed)
NAME:  Kyle Meadows, Kyle Meadows NO.:  [id number]  MEDICAL RECORD NO.:  [id number]  LOCATION:  6195                         FACILITY:  [facility]  PHYSICIAN:  Alexis Frock, MD     DATE OF BIRTH:  12/04/1945  DATE OF PROCEDURE: 03/19/2014  DATE OF DISCHARGE:                              OPERATIVE REPORT   DIAGNOSIS:  Very large prostatic hypertrophy with urinary retention.  PROCEDURE:  Robotic-assisted laparoscopic simple prostatectomy.  ESTIMATED BLOOD LOSS:  150 mL.  COMPLICATIONS:  None.  SPECIMEN:  Prostate adenoma.  DRAINS: 1. Jackson-Pratt drain to bulb suction. 2. Foley catheter to straight drain, which is a 22-French Coude     hematuria with Kyle irrigation port plugged.  ASSISTANT:  Clemetine Marker, PA.  INDICATION:  Kyle Meadows is a very pleasant 68 year old gentleman with progressive history of obstructive voiding symptoms.  He has been on maximum medical therapy with tamsulosin and finasteride despite this. He has had refractory outlet obstruction with occasional urinary retention.  He was evaluated by my colleague, Dr. Gaynelle Arabian and it was felt that given large gland size, which was measured at approximately 200 g that as he had failed medical therapy, Kyle most definitive management would be simple prostatectomy.  Kyle Meadows was seen and evaluated and agreed with Kyle assessment.  We discussed Kyle possible use of minimally invasive approach and he wished to proceed.  Informed consent was obtained and placed in medical record.  PROCEDURE IN DETAIL:  Kyle Meadows being Kyle Meadows, was verified. Procedure being robotic simple prostatectomy was confirmed.  Procedure was carried out.  Time-out was performed.  Intravenous antibiotics were administered.  General endotracheal anesthesia was introduced.  Kyle Meadows was placed into a supine position with arms tucked.  He was further fashioned on Kyle operative table using 3-inch tape across his chest over foam  padding.  A test of steep Trendelenburg position was performed, he was found to be suitably positioned.  Next, a sterile field was created by prepping and draping Kyle Meadows's infra-xiphoid abdomen using chlorhexidine gluconate in his penis, perineum and proximal thighs using iodine, and a high-flow, low-pressure pneumoperitoneum was obtained using Veress technique in Kyle supraumbilical midline having passed Kyle aspiration and drop test. Next, an 8-mm robotic port was placed into this 4 fingerbreadths inferolateral to Kyle umbilicus on Kyle left side even that he had evidence of prior incision in Kyle umbilicus with hernia repair. Laparoscopic examination of Kyle peritoneal cavity revealed no significant adhesions or no visceral injury.  Additional ports were placed as follows; a supraumbilical 8-mm robotic port, right paramedian 8-mm robotic port, right far lateral 12-mm assistant port, right paramedian 5=mm assistant port, left far lateral 8-mm robotic port. Robot was docked and passed through electronic checks.  Attention was directed at development of space of Retzius.  Incision was made lateral to Kyle left mid-umbilical from Kyle midline towards Kyle area of Kyle internal ring and Kyle bladder was carefully swept away from Kyle anterior abdominal wall towards Kyle area of Kyle pubic bone.  Vas deferens was not ligated.  Mirror image dissection was performed on Kyle right side.  Kyle Meadows had some mass in this location and  Kyle bladder was carefully dissected away from this leaving Kyle mass in Kyle area of Kyle right overlying Kyle internal ring, which appeared to be appropriately positioned without obvious defect.  Kyle bladder neck was identified by moving Kyle Foley catheter back and forth and a T-shaped incision was made following Kyle contour of Kyle anterior prostate leaving approximately 2-cm bladder margin and then in Kyle midline superiorly, which allowed Kyle bladder drop away and exposed Kyle  area of Kyle large adenoma.  There was significant asymmetry left greater than right as well as a median lobe.  Ureteral orifices were visualized and displace given this large prostatic hypertrophy.  Kyle ureteral orifices were marked by placing a 5-French feeding tube in each ureter during dissection.  A stay suture was placed in Kyle area of Kyle median lobe. Kyle bladder mucosa was then circumferentially scored around Kyle presumed area of adenoma and dissection proceeded initially inferiorly in Kyle adenoma plane towards Kyle area of Kyle prostatic apex and then filling Kyle adenoma plane right lateral and left lateral and finally anteriorly, at which point, Kyle urethra was encountered again, purposely transected. This completely freed up Kyle adenoma specimen, which was placed into an EndoCatch bag for later retrieval.  There were no obvious prostatic capsule perforations and hemostasis appeared excellent.  Notably just prior to open Kyle bladder, Kyle dorsal venous complex was ligated using 2- 0 Vicryl robotically, but purposely not transected to anchor Kyle prostate anteriorly.  Next, Kyle posterior mucosal advancement was performed using 3-0 V-Loc suture reapproximating Kyle area from 4 o'clock to 8 o'clock posteriorly.  Kyle area of Kyle posterior bladder neck, mucosa and musculature to Kyle posterior membranous urethral plate bringing Kyle mucosa of these into apposition and covering Kyle posterior prostatic capsule.  This also brought Kyle ureters into much more anatomic position, at which point, Kyle marking feeding tubes were removed completely.  Kyle bladder incision was then carefully closed using running 2-0 V-Loc suture x4.  A new 22-French 3-way Foley catheter was placed per urethra with 20 mL sterile water in Kyle balloon.  This was irrigated and was found to be quantitative with no gross leaks visualized laparoscopically and without any clots and irrigant.  At this point, all sponge and needle  counts were correct.  Hemostasis appeared excellent.  Closed suction drain was brought through Kyle previous left lateral most robotic port site to Kyle area of Kyle peritoneal cavity. Kyle right 12-mm assistant port site was closed at Kyle level of Kyle fascia using Carter-Thomason suture passer.  Robot was then undocked. Specimen was retrieved by extending Kyle previous camera port site for total distance of approximately 3 cm, removing Kyle adenoma specimen and setting it aside for permanent pathology.  Kyle extraction site was closed at Kyle level of Kyle fascia using figure-of-eight PDS x3.  All incision sites were infiltrated with dilute lyophilized Marcaine and closed Kyle level of Kyle skin using subcuticular Monocryl followed by Dermabond.  Procedure was then terminated.  Kyle Meadows tolerated Kyle procedure well.  There were no immediate periprocedural complications. Kyle Meadows was taken to [facility] in stable condition.          ______________________________ Alexis Frock, MD     TM/MEDQ  D:  03/19/2014  T:  03/20/2014  Job:  [id number]

## 2014-03-20 NOTE — Discharge Summary (Signed)
  Date of admission: 03/19/2014  Date of discharge: 03/20/2014  Admission diagnosis: BPH  Discharge diagnosis: BPH  History and Physical: For full details, please see admission history and physical. Briefly, Kyle Meadows is a 68 y.o. year old patient with BPH.   Hospital Course: He underwent a robotic assisted laparoscopic simple prostatectomy on 03/19/14.  He was monitored overnight and his urine remained clear.  He had minimal drainage from his perivesical drain which was removed on POD # 1.  His pain was controlled and he was able to be discharged home with his catheter.  Laboratory values:  Recent Labs  03/19/14 1632 03/20/14 0410  HGB 14.0 14.0  HCT 41.1 40.7    Recent Labs  03/20/14 0410  CREATININE 1.13    Disposition: Home  Discharge instruction: The patient was instructed to be ambulatory but told to refrain from heavy lifting, strenuous activity, or driving.   Discharge medications:    Medication List    STOP taking these medications        alfuzosin 10 MG 24 hr tablet  Commonly known as:  UROXATRAL     oxybutynin 10 MG 24 hr tablet  Commonly known as:  DITROPAN-XL      TAKE these medications        fexofenadine 180 MG tablet  Commonly known as:  ALLEGRA  Take 1 tablet (180 mg total) by mouth daily as needed for allergies or rhinitis.     hydrocortisone 2.5 % rectal cream  Commonly known as:  ANUSOL-HC  Place 1 application rectally daily.              senna-docusate 8.6-50 MG per tablet  Commonly known as:  Senokot-S  Take 1 tablet by mouth 2 (two) times daily. While taking pain meds to prevent constipation     sulfamethoxazole-trimethoprim 800-160 MG per tablet  Commonly known as:  BACTRIM DS,SEPTRA DS  Take 1 tablet by mouth 2 (two) times daily. X 3 days. Begin day prior to next Urology appointment.     traMADol 50 MG tablet  Commonly known as:  ULTRAM  Take 1 tablet (50 mg total) by mouth every 6 (six) hours as needed.     UDAMIN SP  PO  Take 1 tablet by mouth daily.        Followup:      Follow-up Information    Follow up with Alexis Frock, MD On 03/27/2014.   Specialty:  Urology   Why:  at 10:30 AM for MD visit, X-Rays, and catheter removal   Contact information:   [place] [address] [phone]

## 2014-03-21 ENCOUNTER — Encounter (HOSPITAL_COMMUNITY): Payer: Self-pay | Admitting: Urology

## 2014-04-30 ENCOUNTER — Other Ambulatory Visit: Payer: Self-pay | Admitting: Urology

## 2014-05-06 ENCOUNTER — Encounter (HOSPITAL_BASED_OUTPATIENT_CLINIC_OR_DEPARTMENT_OTHER): Payer: Self-pay | Admitting: *Deleted

## 2014-05-07 ENCOUNTER — Encounter (HOSPITAL_BASED_OUTPATIENT_CLINIC_OR_DEPARTMENT_OTHER): Payer: Self-pay | Admitting: *Deleted

## 2014-05-07 NOTE — Progress Notes (Signed)
NPO AFTER MN. ARRIVE AT 1030. NEEDS HG. WILL TAKE ALLEGRA AM DOS W/ SIPS OF WATER.

## 2014-05-08 ENCOUNTER — Encounter (HOSPITAL_BASED_OUTPATIENT_CLINIC_OR_DEPARTMENT_OTHER): Payer: Self-pay | Admitting: *Deleted

## 2014-05-08 ENCOUNTER — Encounter (HOSPITAL_BASED_OUTPATIENT_CLINIC_OR_DEPARTMENT_OTHER)
Admission: RE | Disposition: A | Discharge status: Home / Self Care | Payer: Self-pay | Source: Ambulatory Visit | Attending: Urology

## 2014-05-08 ENCOUNTER — Ambulatory Visit (HOSPITAL_BASED_OUTPATIENT_CLINIC_OR_DEPARTMENT_OTHER)
Admission: RE | Admit: 2014-05-08 | Discharge: 2014-05-08 | Disposition: A | Discharge status: Home / Self Care | Payer: PPO | Source: Ambulatory Visit | Attending: Urology | Admitting: Urology

## 2014-05-08 ENCOUNTER — Ambulatory Visit (HOSPITAL_BASED_OUTPATIENT_CLINIC_OR_DEPARTMENT_OTHER): Payer: PPO | Admitting: Anesthesiology

## 2014-05-08 DIAGNOSIS — M1990 Unspecified osteoarthritis, unspecified site: Secondary | ICD-10-CM | POA: Diagnosis not present

## 2014-05-08 DIAGNOSIS — N4889 Other specified disorders of penis: Secondary | ICD-10-CM | POA: Insufficient documentation

## 2014-05-08 DIAGNOSIS — N358 Other urethral stricture: Secondary | ICD-10-CM | POA: Diagnosis not present

## 2014-05-08 DIAGNOSIS — Z85820 Personal history of malignant melanoma of skin: Secondary | ICD-10-CM | POA: Insufficient documentation

## 2014-05-08 DIAGNOSIS — Z885 Allergy status to narcotic agent status: Secondary | ICD-10-CM | POA: Diagnosis not present

## 2014-05-08 DIAGNOSIS — Z85828 Personal history of other malignant neoplasm of skin: Secondary | ICD-10-CM | POA: Diagnosis not present

## 2014-05-08 DIAGNOSIS — N401 Enlarged prostate with lower urinary tract symptoms: Secondary | ICD-10-CM | POA: Diagnosis not present

## 2014-05-08 DIAGNOSIS — R338 Other retention of urine: Secondary | ICD-10-CM | POA: Insufficient documentation

## 2014-05-08 HISTORY — DX: Unspecified urethral stricture, male, unspecified site: N35.919

## 2014-05-08 HISTORY — PX: CYSTOSCOPY WITH URETHRAL DILATATION: SHX5125

## 2014-05-08 HISTORY — PX: CYSTOSCOPY WITH RETROGRADE URETHROGRAM: SHX6309

## 2014-05-08 HISTORY — DX: Personal history of other diseases of urinary system: Z87.448

## 2014-05-08 HISTORY — DX: Personal history of other specified conditions: Z87.898

## 2014-05-08 HISTORY — DX: Other specified postprocedural states: Z98.890

## 2014-05-08 HISTORY — DX: Other allergy status, other than to drugs and biological substances: Z91.09

## 2014-05-08 HISTORY — DX: Diverticulosis of large intestine without perforation or abscess without bleeding: K57.30

## 2014-05-08 HISTORY — DX: Personal history of malignant neoplasm, unspecified: Z85.9

## 2014-05-08 HISTORY — DX: Personal history of other diseases of male genital organs: Z87.438

## 2014-05-08 HISTORY — DX: Other specified postprocedural states: Z85.820

## 2014-05-08 LAB — POCT I-STAT, CHEM 8
BUN: 26 mg/dL — AB (ref 6–23)
CREATININE: 1 mg/dL (ref 0.50–1.35)
Calcium, Ion: 1.27 mmol/L (ref 1.13–1.30)
Chloride: 104 mEq/L (ref 96–112)
Glucose, Bld: 89 mg/dL (ref 70–99)
HCT: 41 % (ref 39.0–52.0)
HEMOGLOBIN: 13.9 g/dL (ref 13.0–17.0)
POTASSIUM: 4.3 mmol/L (ref 3.5–5.1)
Sodium: 142 mmol/L (ref 135–145)
TCO2: 25 mmol/L (ref 0–100)

## 2014-05-08 SURGERY — CYSTOSCOPY WITH RETROGRADE URETHROGRAM
Anesthesia: General | Site: Urethra

## 2014-05-08 MED ORDER — PROPOFOL 10 MG/ML IV BOLUS
INTRAVENOUS | Status: DC | PRN
  Administered 2014-05-08: 200 mg via INTRAVENOUS

## 2014-05-08 MED ORDER — DEXAMETHASONE SODIUM PHOSPHATE 10 MG/ML IJ SOLN
INTRAMUSCULAR | Status: DC | PRN
  Administered 2014-05-08: 10 mg via INTRAVENOUS

## 2014-05-08 MED ORDER — ACETAMINOPHEN 10 MG/ML IV SOLN
INTRAVENOUS | Status: DC | PRN
  Administered 2014-05-08: 1000 mg via INTRAVENOUS

## 2014-05-08 MED ORDER — FENTANYL CITRATE 0.05 MG/ML IJ SOLN
25.0000 ug | INTRAMUSCULAR | Status: DC | PRN
  Filled 2014-05-08: qty 1

## 2014-05-08 MED ORDER — MIDAZOLAM HCL 2 MG/2ML IJ SOLN
INTRAMUSCULAR | Status: AC
  Filled 2014-05-08: qty 2

## 2014-05-08 MED ORDER — SENNOSIDES-DOCUSATE SODIUM 8.6-50 MG PO TABS: 1.0000 | ORAL_TABLET | Freq: Two times a day (BID) | ORAL | Status: DC

## 2014-05-08 MED ORDER — ONDANSETRON HCL 4 MG/2ML IJ SOLN
INTRAMUSCULAR | Status: DC | PRN
  Administered 2014-05-08: 4 mg via INTRAVENOUS

## 2014-05-08 MED ORDER — MIDAZOLAM HCL 5 MG/5ML IJ SOLN
INTRAMUSCULAR | Status: DC | PRN
  Administered 2014-05-08 (×2): 1 mg via INTRAVENOUS

## 2014-05-08 MED ORDER — SULFAMETHOXAZOLE-TRIMETHOPRIM 800-160 MG PO TABS
1.0000 | ORAL_TABLET | Freq: Two times a day (BID) | ORAL | Status: DC
Start: 2014-05-08 — End: 2014-07-08

## 2014-05-08 MED ORDER — FENTANYL CITRATE 0.05 MG/ML IJ SOLN
INTRAMUSCULAR | Status: DC | PRN
  Administered 2014-05-08: 50 ug via INTRAVENOUS
  Administered 2014-05-08: 25 ug via INTRAVENOUS
  Administered 2014-05-08 (×2): 12.5 ug via INTRAVENOUS

## 2014-05-08 MED ORDER — KETOROLAC TROMETHAMINE 30 MG/ML IJ SOLN
INTRAMUSCULAR | Status: DC | PRN
  Administered 2014-05-08: 30 mg via INTRAVENOUS

## 2014-05-08 MED ORDER — TRAMADOL HCL 50 MG PO TABS: 50.0000 mg | ORAL_TABLET | Freq: Four times a day (QID) | ORAL | Status: DC | PRN

## 2014-05-08 MED ORDER — LIDOCAINE HCL (CARDIAC) 20 MG/ML IV SOLN
INTRAVENOUS | Status: DC | PRN
  Administered 2014-05-08: 100 mg via INTRAVENOUS

## 2014-05-08 MED ORDER — LACTATED RINGERS IV SOLN
INTRAVENOUS | Status: DC
  Administered 2014-05-08: 12:00:00 via INTRAVENOUS
  Filled 2014-05-08: qty 1000

## 2014-05-08 MED ORDER — LACTATED RINGERS IV SOLN
INTRAVENOUS | Status: DC | PRN
  Administered 2014-05-08 (×2): via INTRAVENOUS

## 2014-05-08 MED ORDER — GENTAMICIN IN SALINE 1.6-0.9 MG/ML-% IV SOLN
80.0000 mg | INTRAVENOUS | Status: DC
  Filled 2014-05-08: qty 50

## 2014-05-08 MED ORDER — STERILE WATER FOR IRRIGATION IR SOLN
Status: DC | PRN
  Administered 2014-05-08: 3000 mL

## 2014-05-08 MED ORDER — FENTANYL CITRATE 0.05 MG/ML IJ SOLN
INTRAMUSCULAR | Status: AC
  Filled 2014-05-08: qty 4

## 2014-05-08 MED ORDER — GENTAMICIN SULFATE 40 MG/ML IJ SOLN
400.0000 mg | Freq: Once | INTRAVENOUS | Status: AC
  Administered 2014-05-08: 400 mg via INTRAVENOUS
  Filled 2014-05-08: qty 10

## 2014-05-08 SURGICAL SUPPLY — 29 items
BAG URINE DRAINAGE (UROLOGICAL SUPPLIES) IMPLANT
BAG URINE LEG 19OZ MD ST LTX (BAG) ×3 IMPLANT
BAG URO CATCHER STRL LF (DRAPE) ×3 IMPLANT
BALLN NEPHROSTOMY (BALLOONS) ×3
BALLOON NEPHROSTOMY (BALLOONS) ×1 IMPLANT
CATH FOLEY 2W COUNCIL 20FR 5CC (CATHETERS) IMPLANT
CATH FOLEY 2W COUNCIL 5CC 16FR (CATHETERS) IMPLANT
CATH FOLEY 2W COUNCIL 5CC 18FR (CATHETERS) IMPLANT
CATH FOLEY 2WAY  3CC 10FR (CATHETERS)
CATH FOLEY 2WAY 3CC 10FR (CATHETERS) IMPLANT
CATH ROBINSON RED A/P 14FR (CATHETERS) IMPLANT
CLOTH BEACON ORANGE TIMEOUT ST (SAFETY) ×3 IMPLANT
DRAPE CAMERA CLOSED 9X96 (DRAPES) ×3 IMPLANT
ELECT REM PT RETURN 9FT ADLT (ELECTROSURGICAL)
ELECTRODE REM PT RTRN 9FT ADLT (ELECTROSURGICAL) IMPLANT
FOLEY ×5 IMPLANT
GLOVE BIO SURGEON STRL SZ7.5 (GLOVE) ×3 IMPLANT
GOWN PREVENTION PLUS LG XLONG (DISPOSABLE) IMPLANT
GOWN STRL NON-REIN LRG LVL3 (GOWN DISPOSABLE) IMPLANT
GOWN STRL REUS W/ TWL LRG LVL3 (GOWN DISPOSABLE) ×1 IMPLANT
GOWN STRL REUS W/ TWL XL LVL3 (GOWN DISPOSABLE) ×1 IMPLANT
GOWN STRL REUS W/TWL LRG LVL3 (GOWN DISPOSABLE) ×2
GOWN STRL REUS W/TWL XL LVL3 (GOWN DISPOSABLE) ×2
GUIDEWIRE STR DUAL SENSOR (WIRE) ×3 IMPLANT
HOLDER FOLEY CATH W/STRAP (MISCELLANEOUS) ×3 IMPLANT
NEEDLE HYPO 18GX1.5 BLUNT FILL (NEEDLE) IMPLANT
PACK CYSTO (CUSTOM PROCEDURE TRAY) ×3 IMPLANT
SYR 20CC LL (SYRINGE) ×3 IMPLANT
WATER STERILE IRR 3000ML UROMA (IV SOLUTION) ×3 IMPLANT

## 2014-05-08 NOTE — H&P (Signed)
Kyle Meadows is an 69 y.o. male.    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 Pre-op TRUS 155gm. Passed trial of void 1 week post-op.   2 - Urethral Stricture - pt with new splaying of urinary stream and weak stream 04/2014. Attemtped office cysto 04/2014 confirms likely high-grade distal stricture. PVR "9ml". No valsalva / extreme pressure requried for void.   PMH sig for lap right inguinal hernia repair (mesh) via umbilical approach. 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    Today Kyle Meadows is seen to proceed with cysto and dilation of urethral strictrue. NO interval fevers.   Past Medical History  Diagnosis Date  . H/O sleep apnea     S/P  OSA surgery -- resolved  . Hemorrhoids   . Arthritis   . Frequency of urination   . Diverticulosis, sigmoid   . Environmental allergies   . History of BPH   . Urethral stricture   . History of melanoma excision     back  . History of squamous cell carcinoma excision     right arm, leg, face  . History of nephritis     age 53  . History of urinary retention     secondary bph with obstruction    Past Surgical History  Procedure Laterality Date  . Uvulopalatopharyngoplasty  x5  last one 1995  . Robot assisted laparoscopic radical prostatectomy N/A 03/19/2014    Procedure: ROBOTIC ASSISTED LAPAROSCOPIC SIMPLE PROSTATECTOMY;  Surgeon: Alexis Frock, MD;  Location: [facility];  Service: Urology;  Laterality: N/A;  . Knee arthroscopy w/ meniscectomy Right x2   last one 2005  . Laparoscopic inguinal hernia repair Right 2007 (approx)  . Orif  right wrist fx  2008    retained hardware  . Tonsillectomy  age 75    Family History  Problem Relation Age of Onset  . Heart disease Father     CABG x 4  .  Arthritis Father    Social History:  reports that he has never smoked. He has never used smokeless tobacco. He reports that he drinks alcohol. He reports that he does not use illicit drugs.  Allergies:  Allergies  Allergen Reactions  . Percocet [Oxycodone-Acetaminophen] Other (See Comments)    "keeps me wide awake"    No prescriptions prior to admission    No results found for this or any previous visit (from the past 48 hour(s)). No results found.  Review of Systems  Constitutional: Negative.   HENT: Negative.   Eyes: Negative.   Respiratory: Negative.   Cardiovascular: Negative.   Gastrointestinal: Negative.   Genitourinary:       Spalyed urinary stream  Musculoskeletal: Negative.   Skin: Negative.   Neurological: Negative.   Endo/Heme/Allergies: Negative.   Psychiatric/Behavioral: Negative.     Height 5\' 9"  (1.753 m), weight 79.379 kg (175 lb). Physical Exam  Constitutional: He appears well-developed.  HENT:  Head: Normocephalic.  Eyes: Pupils are equal, round, and reactive to light.  Neck: Normal range of motion.  Cardiovascular: Normal rate.   Respiratory: Effort normal.  GI: Soft.  Prior surgical sites well healed.   Genitourinary:  No CVAT  Musculoskeletal: Normal range of motion.  Neurological: He is alert.  Skin: Skin is warm.  Psychiatric: He has a normal mood and affect. His  behavior is normal. Judgment and thought content normal.     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   2 - Urethral Stricture - impressive development of distal stricture, maybe due to recent catheters. As voiding to completion rec operative cysto / RUG / dilation as outpatint at the end of the month to allow him to travel to see new grandson prior. Warned about possible retention / etc and he understands to contact MD if develops.  Risks including bleeding, infection,  stricture recurrence, need for post-op catheter discussed.   Kyle Meadows 05/08/2014, 7:01 AM

## 2014-05-08 NOTE — Anesthesia Procedure Notes (Signed)
Procedure Name: LMA Insertion Date/Time: 05/08/2014 12:07 PM Performed by: Justice Rocher Pre-anesthesia Checklist: Patient identified, Emergency Drugs available, Suction available and Patient being monitored Patient Re-evaluated:Patient Re-evaluated prior to inductionOxygen Delivery Method: Circle System Utilized Preoxygenation: Pre-oxygenation with 100% oxygen Intubation Type: IV induction Ventilation: Mask ventilation without difficulty LMA: LMA inserted LMA Size: 4.0 Number of attempts: 1 Airway Equipment and Method: Bite block Placement Confirmation: positive ETCO2 Tube secured with: Tape Dental Injury: Teeth and Oropharynx as per pre-operative assessment

## 2014-05-08 NOTE — Discharge Instructions (Signed)
1 - You may have urinary urgency (bladder spasms) and bloody urine on / off with catheter place. This is normal.  2 - Call MD or go to ER for fever >102, severe pain / nausea / vomiting not relieved by medications, or acute change in medical status     Post Anesthesia Home Care Instructions  Activity: Get plenty of rest for the remainder of the day. A responsible adult should stay with you for 24 hours following the procedure.  For the next 24 hours, DO NOT: -Drive a car -Paediatric nurse -Drink alcoholic beverages -Take any medication unless instructed by your physician -Make any legal decisions or sign important papers.  Meals: Start with liquid foods such as gelatin or soup. Progress to regular foods as tolerated. Avoid greasy, spicy, heavy foods. If nausea and/or vomiting occur, drink only clear liquids until the nausea and/or vomiting subsides. Call your physician if vomiting continues.  Special Instructions/Symptoms: Your throat may feel dry or sore from the anesthesia or the breathing tube placed in your throat during surgery. If this causes discomfort, gargle with warm salt water. The discomfort should disappear within 24 hours.   Call your surgeon if you experience:   1.  Fever over 101.0. 2.  Nausea and/or vomiting. 3.  Extreme swelling or bruising at the surgical site. 4.  Continued bleeding from the incision. 5.  Increased pain, redness or drainage from the incision. 6.  Problems related to your pain medication. 7. Any problems and/or concernsCYSTOSCOPY HOME CARE INSTRUCTIONS  Activity: Rest for the remainder of the day.  Do not drive or operate equipment today.  You may resume normal activities in one to two days as instructed by your physician.   Meals: Drink plenty of liquids and eat light foods such as gelatin or soup this evening.  You may return to a normal meal plan tomorrow.  Return to Work: You may return to work in one to two days or as instructed by  your physician.  Special Instructions / Symptoms: Call your physician if any of these symptoms occur:   -persistent or heavy bleeding  -bleeding which continues after first few urination  -large blood clots that are difficult to pass  -urine stream diminishes or stops completely  -fever equal to or higher than 101 degrees Farenheit.  -cloudy urine with a strong, foul odor  -severe pain  Females should always wipe from front to back after elimination.  You may feel some burning pain when you urinate.  This should disappear with time.  Applying moist heat to the lower abdomen or a hot tub bath may help relieve the pain. \  Follow-Up / Date of Return Visit to Your Physician:  Call for an appointment to arrange follow-up.  Patient Signature:  ________________________________________________________  Nurse's Signature:  ________________________________________________________

## 2014-05-08 NOTE — Anesthesia Postprocedure Evaluation (Signed)
  Anesthesia Post-op Note  Patient: Kyle Meadows  Procedure(s) Performed: Procedure(s): CYSTOSCOPY WITH RETROGRADE URETHROGRAM (N/A) CYSTOSCOPY WITH URETHRAL DILATATION (N/A)  Patient Location: PACU  Anesthesia Type:General  Level of Consciousness: awake and alert   Airway and Oxygen Therapy: Patient Spontanous Breathing  Post-op Pain: none  Post-op Assessment: Post-op Vital signs reviewed, Patient's Cardiovascular Status Stable and Respiratory Function Stable  Post-op Vital Signs: Reviewed  Filed Vitals:   05/08/14 1315  BP: 135/76  Pulse: 74  Temp:   Resp: 15    Complications: No apparent anesthesia complications

## 2014-05-08 NOTE — Anesthesia Preprocedure Evaluation (Addendum)
Anesthesia Evaluation  Patient identified by MRN, date of birth, ID band Patient awake    Reviewed: Allergy & Precautions, H&P , NPO status , Patient's Chart, lab work & pertinent test results  Airway Mallampati: II  TM Distance: >3 FB Neck ROM: Full    Dental no notable dental hx. (+) Teeth Intact, Dental Advisory Given   Pulmonary neg pulmonary ROS,  breath sounds clear to auscultation  Pulmonary exam normal       Cardiovascular negative cardio ROS  Rhythm:Regular Rate:Normal     Neuro/Psych negative neurological ROS  negative psych ROS   GI/Hepatic negative GI ROS, Neg liver ROS,   Endo/Other  negative endocrine ROS  Renal/GU negative Renal ROS  negative genitourinary   Musculoskeletal  (+) Arthritis -, Osteoarthritis,    Abdominal   Peds  Hematology negative hematology ROS (+)   Anesthesia Other Findings   Reproductive/Obstetrics negative OB ROS                            Anesthesia Physical Anesthesia Plan  ASA: II  Anesthesia Plan: General   Post-op Pain Management:    Induction: Intravenous  Airway Management Planned: LMA  Additional Equipment:   Intra-op Plan:   Post-operative Plan: Extubation in OR  Informed Consent: I have reviewed the patients History and Physical, chart, labs and discussed the procedure including the risks, benefits and alternatives for the proposed anesthesia with the patient or authorized representative who has indicated his/her understanding and acceptance.   Dental advisory given  Plan Discussed with: CRNA  Anesthesia Plan Comments:         Anesthesia Quick Evaluation

## 2014-05-08 NOTE — Transfer of Care (Signed)
Immediate Anesthesia Transfer of Care Note  Patient: Kyle Meadows  Procedure(s) Performed: Procedure(s) (LRB): CYSTOSCOPY WITH RETROGRADE URETHROGRAM (N/A) CYSTOSCOPY WITH URETHRAL DILATATION (N/A)  Patient Location: PACU  Anesthesia Type: General  Level of Consciousness: awake, sedated, patient cooperative and responds to stimulation  Airway & Oxygen Therapy: Patient Spontanous Breathing and Patient connected to face mask oxygen  Post-op Assessment: Report given to PACU RN, Post -op Vital signs reviewed and stable and Patient moving all extremities  Post vital signs: Reviewed and stable  Complications: No apparent anesthesia complications

## 2014-05-08 NOTE — Brief Op Note (Signed)
05/08/2014  12:30 PM  PATIENT:  Kyle Meadows  69 y.o. male  PRE-OPERATIVE DIAGNOSIS:  URETHRAL STRICTURE  POST-OPERATIVE DIAGNOSIS:  URETHRAL STRICTURE  PROCEDURE:  Procedure(s): CYSTOSCOPY WITH RETROGRADE URETHROGRAM (N/A) CYSTOSCOPY WITH URETHRAL DILATATION (N/A)  SURGEON:  Surgeon(s) and Role:    * Alexis Frock, MD - Primary  PHYSICIAN ASSISTANT:   ASSISTANTS: none   ANESTHESIA:   general  EBL:  Total I/O In: 100 [I.V.:100] Out: -   BLOOD ADMINISTERED:none  DRAINS: 56F council catheter to gravity drainage   LOCAL MEDICATIONS USED:  NONE  SPECIMEN:  No Specimen  DISPOSITION OF SPECIMEN:  N/A  COUNTS:  YES  TOURNIQUET:  * No tourniquets in log *  DICTATION: .Other Dictation: Dictation Number [id number]  PLAN OF CARE: Discharge to home after PACU  PATIENT DISPOSITION:  PACU - hemodynamically stable.   Delay start of Pharmacological VTE agent (>24hrs) due to surgical blood loss or risk of bleeding: not applicable

## 2014-05-09 ENCOUNTER — Encounter (HOSPITAL_BASED_OUTPATIENT_CLINIC_OR_DEPARTMENT_OTHER): Payer: Self-pay | Admitting: Urology

## 2014-05-09 NOTE — Op Note (Signed)
NAME:  Kyle, Meadows NO.:  [id number]  MEDICAL RECORD NO.:  [id number]  LOCATION:                                 FACILITY:  PHYSICIAN:  Alexis Frock, MD     DATE OF BIRTH:  1945/08/07  DATE OF PROCEDURE:  05/08/2014 DATE OF DISCHARGE:  05/08/2014                              OPERATIVE REPORT   DIAGNOSIS:  New high-grade distal urethral stricture.  PROCEDURES: 1. Retrograde urethrogram interpretation. 2. Balloon dilation of urethral stricture. 3. Cystourethroscopy. 4. Foley catheter placement, complicated.  ESTIMATED BLOOD LOSS:  Nil.  COMPLICATIONS:  None.  SPECIMEN:  None.  FINDINGS: 1. Dense but short segment distal penile stricture, appeared to be     just proximal to the fossa navicularis, this was estimated to be     less than 1 cm in length. 2. Prostate fossa changes consistent with recent simple prostatectomy. 3. Unremarkable urinary bladder. 4. Placement of 22-French Councill catheter over a guidewire into the     urinary bladder.  INDICATION:  Kyle Meadows is a very pleasant 69 year old gentleman with a history of significant prostatic hypertrophy with prior urinary retention.  He underwent simple prostatectomy recently for this and initially did very very well with a near complete resolution of his obstructive symptoms.  Unfortunately, he began having recurrence of obstructive symptoms and to help delineate possible etiology and attempted office cystoscopy was performed, and he was found to have a very high-grade distal urethral stricture, and the most distal pendulous urethra versus fossa navicularis area.  I feel that the symptoms were likely due to this.  Given the patient's recent surgery and relatively complex anatomy as well as the safest way to proceed would be dilation of this in the operative setting, and he wished to proceed.  Informed consent was obtained, placed in medical record.  DESCRIPTION OF PROCEDURE:  The patient  being, Kyle Meadows, verified and procedure being cysto urethral dilation and catheter placement was confirmed.  Procedure was carried out.  Time-out was performed. Intravenous antibiotics were administered.  General LMA anesthesia was introduced.  The patient was placed into a low lithotomy position. Sterile field was created by prepping and draping penis, perineum, and proximal thighs using iodine x3.  Attention was then directed at retrograde urethrogram.  The tip of a 10 mL syringe was inserted carefully into the distal penis.  Retrograde contrast was filled and spot fluoroscopic images were obtained.  Retrograde urethrogram revealed a very narrow caliber distal pendulous urethra, with stricture.  Proximal to this, there was wide open contrast flowing freely to the urinary bladder.  A 0.03 zip wire was then advanced at the level of the urinary bladder over which a new NephroMax balloon dilation apparatus was carefully positioned across the distal pendulous urethra.  This was inflated to a pressure of 18 atmospheres, held for 90 seconds, then released.  Notably, this was 24-French diameter.  Next, the urethra was inspected using 22-French rigid cystoscope with 12-degree offset lens.  This did reveal as expected some distal pendulous stricture that appeared to be short segment.  There was no obvious mucosal disruption with the dilation of this proximally near the prostatic  urethra.  There are postoperative changes expected with some granulation tissue and some visible stitch still left place, consistent with mucosal advancement sutures.  The urinary bladder was unremarkable.  It was confirmed that the guidewire was within the lumen of the urinary bladder.  The scope was removed and exchanged for a new 22-French Councill catheter at the level of the urinary bladder.  A 10 mL of water placed in the balloon.  This was connected to straight drain.  Procedure was then terminated.  The  patient tolerated the procedure well.  There were no immediate periprocedural complications. The patient was taken to [facility] in stable condition.          ______________________________ Alexis Frock, MD     TM/MEDQ  D:  05/08/2014  T:  05/08/2014  Job:  [id number]

## 2014-07-03 ENCOUNTER — Ambulatory Visit: Payer: Commercial Managed Care - HMO | Admitting: Internal Medicine

## 2014-07-08 ENCOUNTER — Encounter: Payer: Self-pay | Admitting: Internal Medicine

## 2014-07-08 ENCOUNTER — Ambulatory Visit (INDEPENDENT_AMBULATORY_CARE_PROVIDER_SITE_OTHER): Payer: PPO | Admitting: Internal Medicine

## 2014-07-08 VITALS — BP 110/70 | HR 65 | Temp 98.4°F | Ht 69.25 in | Wt 177.2 lb

## 2014-07-08 DIAGNOSIS — Z0000 Encounter for general adult medical examination without abnormal findings: Secondary | ICD-10-CM

## 2014-07-08 DIAGNOSIS — R1032 Left lower quadrant pain: Secondary | ICD-10-CM

## 2014-07-08 DIAGNOSIS — N138 Other obstructive and reflux uropathy: Secondary | ICD-10-CM

## 2014-07-08 DIAGNOSIS — N401 Enlarged prostate with lower urinary tract symptoms: Secondary | ICD-10-CM

## 2014-07-08 NOTE — Progress Notes (Signed)
Pre visit review using our clinic review tool, if applicable. No additional management support is needed unless otherwise documented below in the visit note. 

## 2014-07-13 DIAGNOSIS — Z0000 Encounter for general adult medical examination without abnormal findings: Secondary | ICD-10-CM | POA: Insufficient documentation

## 2014-07-13 NOTE — Progress Notes (Signed)
[name], male   DOB: [birth year], 69 y.o.   MRN: [id number]   Subjective:    [name], male    DOB: Jul 25, 1945, 69 y.o.   MRN: [id number]  HPI  Patient here for a scheduled follow up.  He is doing better regarding his prostate surgery.  Still having the left lower quadrant pain.  No change with urination or bowel changes.  Pain radiates in to his left testicle.  No redness.  He also reports persistent knee pain.  Has taken Lodine.  No nausea or vomiting.  Eating and drinking well.   No other abdominal pain or cramping.     Past Medical History  Diagnosis Date  . H/O sleep apnea     S/P  OSA surgery -- resolved  . Hemorrhoids   . Arthritis   . Frequency of urination   . Diverticulosis, sigmoid   . Environmental allergies   . History of BPH   . Urethral stricture   . History of melanoma excision     back  . History of squamous cell carcinoma excision     right arm, leg, face  . History of nephritis     age 31  . History of urinary retention     secondary bph with obstruction    Current Outpatient Prescriptions on File Prior to Visit  Medication Sig Dispense Refill  . fexofenadine (ALLEGRA) 180 MG tablet Take 1 tablet (180 mg total) by mouth daily as needed for allergies or rhinitis. (Patient taking differently: Take 180 mg by mouth every morning. ) 90 tablet 3  . hydrocortisone (ANUSOL-HC) 2.5 % rectal cream Place 1 application rectally daily. 90 g 2  . meloxicam (MOBIC) 15 MG tablet Take 15 mg by mouth daily as needed for pain.     . Multiple Vitamins-Minerals (CENTRUM SILVER ADULT 50+ PO) Take 1 tablet by mouth daily.     No current facility-administered medications on file prior to visit.    Review of Systems  Constitutional: Negative for appetite change and unexpected weight change.  HENT: Negative for congestion and sinus pressure.   Respiratory: Negative for cough, chest tightness and shortness of breath.   Cardiovascular: Negative  for chest pain, palpitations and leg swelling.  Gastrointestinal: Negative for nausea, vomiting, abdominal pain and diarrhea.       Does report the left lower quadrant pain and pain extending into his left testicle.    Genitourinary: Negative for dysuria and difficulty urinating.  Musculoskeletal: Negative for back pain.       Knee pain as outlined.    Skin: Negative for color change and rash.  Neurological: Negative for dizziness, light-headedness and headaches.       Objective:    Physical Exam  Constitutional: He appears well-developed and well-nourished. No distress.  HENT:  Nose: Nose normal.  Mouth/Throat: Oropharynx is clear and moist.  Neck: Neck supple. No thyromegaly present.  Cardiovascular: Normal rate and regular rhythm.   Pulmonary/Chest: Effort normal and breath sounds normal. No respiratory distress.  Abdominal: Soft. Bowel sounds are normal. There is no tenderness.  Genitourinary:  Increased pain in the left testicle and left inguinal area.    Musculoskeletal: He exhibits no edema.  Lymphadenopathy:    He has no cervical adenopathy.  Skin: No rash noted. No erythema.  Psychiatric: He has a normal mood and affect. His behavior is normal.    BP 110/70 mmHg  Pulse 65  Temp(Src) 98.4 F (36.9  C) (Oral)  Ht 5' 9.25" (1.759 m)  Wt 177 lb 4 oz (80.4 kg)  BMI 25.99 kg/m2  SpO2 94% Wt Readings from Last 3 Encounters:  07/08/14 177 lb 4 oz (80.4 kg)  05/08/14 172 lb (78.019 kg)  03/19/14 175 lb 4 oz (79.493 kg)     Lab Results  Component Value Date   WBC 6.6 03/12/2014   HGB 13.9 05/08/2014   HCT 41.0 05/08/2014   PLT 233 03/12/2014   GLUCOSE 89 05/08/2014   CHOL 187 02/11/2014   TRIG 69.0 02/11/2014   HDL 35.50* 02/11/2014   LDLCALC 138* 02/11/2014   ALT 18 02/11/2014   AST 22 02/11/2014   NA 142 05/08/2014   K 4.3 05/08/2014   CL 104 05/08/2014   CREATININE 1.00 05/08/2014   BUN 26* 05/08/2014   CO2 25 03/20/2014   TSH 1.48 02/11/2014         Assessment & Plan:   Problem List Items Addressed This Visit    Health care maintenance    Due colonoscopy in 2015.  Discussed with him today.  Wants to hold on referral.  Will notify me when agreeable.        Left lower quadrant pain    Had CT previously.  S/p surgery.  Persistent pain.  No bowel issues.  Pain extends into the left testicle.  Will have urology reevaluate.        Prostatic hyperplasia, benign localized, with obstruction - Primary    S/p surgery.  Doing well.  Continue f/u with urology.         I spent 25 minutes with the patient and more than 50% of the time was spent in consultation regarding the above.     Einar Pheasant, MD

## 2014-07-13 NOTE — Assessment & Plan Note (Signed)
Due colonoscopy in 2015.  Discussed with him today.  Wants to hold on referral.  Will notify me when agreeable.

## 2014-07-13 NOTE — Assessment & Plan Note (Signed)
S/p surgery.  Doing well.  Continue f/u with urology.

## 2014-07-13 NOTE — Assessment & Plan Note (Signed)
Had CT previously.  S/p surgery.  Persistent pain.  No bowel issues.  Pain extends into the left testicle.  Will have urology reevaluate.

## 2014-09-27 ENCOUNTER — Other Ambulatory Visit: Payer: Self-pay | Admitting: Internal Medicine

## 2014-09-29 NOTE — Telephone Encounter (Signed)
Ok refill? 

## 2014-09-30 NOTE — Telephone Encounter (Signed)
Refilled 90g with no refills.

## 2015-01-08 ENCOUNTER — Ambulatory Visit: Payer: PPO | Admitting: Internal Medicine

## 2015-02-23 ENCOUNTER — Ambulatory Visit: Payer: PPO | Admitting: Internal Medicine

## 2015-02-24 ENCOUNTER — Ambulatory Visit: Payer: PPO | Admitting: Internal Medicine

## 2015-04-13 ENCOUNTER — Telehealth: Payer: Self-pay | Admitting: Internal Medicine

## 2015-04-13 NOTE — Telephone Encounter (Signed)
Left msg for pt to call office to schedule AWV.msn °

## 2015-05-14 ENCOUNTER — Telehealth: Payer: Self-pay | Admitting: Internal Medicine

## 2015-05-14 NOTE — Telephone Encounter (Signed)
Left msg to call office to schedule AWV/msn °

## 2015-06-03 DIAGNOSIS — X32XXXA Exposure to sunlight, initial encounter: Secondary | ICD-10-CM | POA: Diagnosis not present

## 2015-06-03 DIAGNOSIS — D485 Neoplasm of uncertain behavior of skin: Secondary | ICD-10-CM | POA: Diagnosis not present

## 2015-06-03 DIAGNOSIS — D2272 Melanocytic nevi of left lower limb, including hip: Secondary | ICD-10-CM | POA: Diagnosis not present

## 2015-06-03 DIAGNOSIS — D2261 Melanocytic nevi of right upper limb, including shoulder: Secondary | ICD-10-CM | POA: Diagnosis not present

## 2015-06-03 DIAGNOSIS — D225 Melanocytic nevi of trunk: Secondary | ICD-10-CM | POA: Diagnosis not present

## 2015-06-03 DIAGNOSIS — D2271 Melanocytic nevi of right lower limb, including hip: Secondary | ICD-10-CM | POA: Diagnosis not present

## 2015-06-03 DIAGNOSIS — D045 Carcinoma in situ of skin of trunk: Secondary | ICD-10-CM | POA: Diagnosis not present

## 2015-06-03 DIAGNOSIS — L570 Actinic keratosis: Secondary | ICD-10-CM | POA: Diagnosis not present

## 2015-06-10 ENCOUNTER — Ambulatory Visit (INDEPENDENT_AMBULATORY_CARE_PROVIDER_SITE_OTHER): Payer: PPO | Admitting: Internal Medicine

## 2015-06-10 ENCOUNTER — Encounter: Payer: Self-pay | Admitting: Internal Medicine

## 2015-06-10 VITALS — BP 128/70 | HR 62 | Temp 98.3°F | Resp 18 | Ht 69.25 in | Wt 174.5 lb

## 2015-06-10 DIAGNOSIS — R1032 Left lower quadrant pain: Secondary | ICD-10-CM

## 2015-06-10 DIAGNOSIS — Z9109 Other allergy status, other than to drugs and biological substances: Secondary | ICD-10-CM

## 2015-06-10 DIAGNOSIS — N400 Enlarged prostate without lower urinary tract symptoms: Secondary | ICD-10-CM | POA: Diagnosis not present

## 2015-06-10 DIAGNOSIS — N401 Enlarged prostate with lower urinary tract symptoms: Secondary | ICD-10-CM

## 2015-06-10 DIAGNOSIS — Z91048 Other nonmedicinal substance allergy status: Secondary | ICD-10-CM

## 2015-06-10 DIAGNOSIS — N138 Other obstructive and reflux uropathy: Secondary | ICD-10-CM

## 2015-06-10 NOTE — Progress Notes (Signed)
[name], male   DOB: [birth year], 70 y.o.   MRN: [id number]   Subjective:    [name], male    DOB: August 17, 1945, 70 y.o.   MRN: [id number]  HPI  Patient with past history of allergies and enlarged prostate.  He comes in today to follow up on these issues.  He is s/p prostate surgery and is doing well from the surgery.  He still has some lower pelvic pain.  See previous notes for details. Urology felt from urological standpoint - ok.  CT unrevealing of a cause.  He mostly notices after he blows leaves or strenuous activity.  Is not constant.  Is riding his bike.  No chest pain or tightness with increased activity or exertion.  Breathing stable.  No other abdominal pain or cramping.  Bowels stable.     Past Medical History  Diagnosis Date  . H/O sleep apnea     S/P  OSA surgery -- resolved  . Hemorrhoids   . Arthritis   . Frequency of urination   . Diverticulosis, sigmoid   . Environmental allergies   . History of BPH   . Urethral stricture   . History of melanoma excision     back  . History of squamous cell carcinoma excision     right arm, leg, face  . History of nephritis     age 73  . History of urinary retention     secondary bph with obstruction   Past Surgical History  Procedure Laterality Date  . Uvulopalatopharyngoplasty  x5  last one 1995  . Robot assisted laparoscopic radical prostatectomy N/A 03/19/2014    Procedure: ROBOTIC ASSISTED LAPAROSCOPIC SIMPLE PROSTATECTOMY;  Surgeon: Alexis Frock, MD;  Location: [facility];  Service: Urology;  Laterality: N/A;  . Knee arthroscopy w/ meniscectomy Right x2   last one 2005  . Laparoscopic inguinal hernia repair Right 2007 (approx)  . Orif  right wrist fx  2008    retained hardware  . Tonsillectomy  age 31  . Cystoscopy with retrograde urethrogram N/A 05/08/2014    Procedure: CYSTOSCOPY WITH RETROGRADE URETHROGRAM;  Surgeon: Alexis Frock, MD;  Location: [facility];  Service:  Urology;  Laterality: N/A;  . Cystoscopy with urethral dilatation N/A 05/08/2014    Procedure: CYSTOSCOPY WITH URETHRAL DILATATION;  Surgeon: Alexis Frock, MD;  Location: [facility];  Service: Urology;  Laterality: N/A;   Family History  Problem Relation Age of Onset  . Heart disease Father     CABG x 4  . Arthritis Father    Social History   Social History  . Marital Status: Married    Spouse Name: N/A  . Number of Children: N/A  . Years of Education: N/A   Social History Main Topics  . Smoking status: Never Smoker   . Smokeless tobacco: Never Used  . Alcohol Use: 0.0 oz/week    0 Standard drinks or equivalent per week     Comment: rare  . Drug Use: No  . Sexual Activity: Not Asked   Other Topics Concern  . None   Social History Narrative    Outpatient Encounter Prescriptions as of 06/10/2015  Medication Sig  . fexofenadine (ALLEGRA) 180 MG tablet Take 1 tablet (180 mg total) by mouth daily as needed for allergies or rhinitis. (Patient taking differently: Take 180 mg by mouth every morning. )  . hydrocortisone (PROCTOZONE-HC) 2.5 % rectal cream Place rectally daily as needed for  hemorrhoids or itching.  . meloxicam (MOBIC) 15 MG tablet Take 15 mg by mouth daily as needed for pain.   . Multiple Vitamins-Minerals (CENTRUM SILVER ADULT 50+ PO) Take 1 tablet by mouth daily.  Marland Kitchen oxybutynin (DITROPAN) 5 MG tablet TK 1 T PO Q 8 H PRF URINARY FREQUENCY OR URGENCY   No facility-administered encounter medications on file as of 06/10/2015.    Review of Systems  Constitutional: Negative for appetite change and unexpected weight change.  HENT: Negative for congestion and sinus pressure.   Respiratory: Negative for cough, chest tightness and shortness of breath.   Cardiovascular: Negative for chest pain, palpitations and leg swelling.  Gastrointestinal: Positive for abdominal pain. Negative for nausea, vomiting and diarrhea.  Genitourinary: Negative for dysuria and  difficulty urinating.  Musculoskeletal: Negative for back pain and joint swelling.  Skin: Negative for color change and rash.  Neurological: Negative for dizziness, light-headedness and headaches.  Psychiatric/Behavioral: Negative for dysphoric mood and agitation.       Objective:    Physical Exam  Constitutional: He appears well-developed and well-nourished. No distress.  HENT:  Nose: Nose normal.  Mouth/Throat: Oropharynx is clear and moist.  Neck: Neck supple. No thyromegaly present.  Cardiovascular: Normal rate and regular rhythm.   Pulmonary/Chest: Effort normal and breath sounds normal. No respiratory distress.  Abdominal: Soft. Bowel sounds are normal. There is no tenderness.  Musculoskeletal: He exhibits no edema or tenderness.  Lymphadenopathy:    He has no cervical adenopathy.  Skin: No rash noted. No erythema.  Psychiatric: He has a normal mood and affect. His behavior is normal.    BP 128/70 mmHg  Pulse 62  Temp(Src) 98.3 F (36.8 C) (Oral)  Resp 18  Ht 5' 9.25" (1.759 m)  Wt 174 lb 8 oz (79.153 kg)  BMI 25.58 kg/m2  SpO2 97% Wt Readings from Last 3 Encounters:  06/10/15 174 lb 8 oz (79.153 kg)  07/08/14 177 lb 4 oz (80.4 kg)  05/08/14 172 lb (78.019 kg)     Lab Results  Component Value Date   WBC 7.1 06/11/2015   HGB 14.9 06/11/2015   HCT 43.8 06/11/2015   PLT 249.0 06/11/2015   GLUCOSE 97 06/11/2015   CHOL 235* 06/11/2015   TRIG 111.0 06/11/2015   HDL 56.30 06/11/2015   LDLCALC 157* 06/11/2015   ALT 23 06/11/2015   AST 23 06/11/2015   NA 140 06/11/2015   K 4.6 06/11/2015   CL 102 06/11/2015   CREATININE 0.94 06/11/2015   BUN 23 06/11/2015   CO2 31 06/11/2015   TSH 1.11 06/11/2015       Assessment & Plan:   Problem List Items Addressed This Visit    Enlarged prostate    Followed by Dr Gaynelle Arabian ([facility]).  Stable.  On ditropan for bladder.  S/p surgery - prostate.        Environmental allergies    Some minimal nasal congestion,  etc.  On allegra.  Add nasacort as directed.  Follow.        Left lower quadrant pain    Had CT - unrevealing.  S/p surgery.  Persistent pain intermittently.  Notices when blows leaves, etc.  No bowel issues.  Discussed further w/up.  He declines at this time.  Urology evalauted.        Prostatic hyperplasia, benign localized, with obstruction - Primary    S/p surgery.  Followed by urology.  Stable.  On ditropan.         I  spent 25 minutes with the patient and more than 50% of the time was spent in consultation regarding the above.     Einar Pheasant, MD

## 2015-06-10 NOTE — Progress Notes (Signed)
Pre-visit discussion using our clinic review tool. No additional management support is needed unless otherwise documented below in the visit note.  

## 2015-06-10 NOTE — Patient Instructions (Signed)
nasacort nasal spray - 2 sprays each nostril one time per day.  Do this in the evening.   

## 2015-06-11 ENCOUNTER — Other Ambulatory Visit: Payer: Self-pay | Admitting: Internal Medicine

## 2015-06-11 ENCOUNTER — Other Ambulatory Visit (INDEPENDENT_AMBULATORY_CARE_PROVIDER_SITE_OTHER): Payer: PPO

## 2015-06-11 DIAGNOSIS — R42 Dizziness and giddiness: Secondary | ICD-10-CM

## 2015-06-11 DIAGNOSIS — Z13220 Encounter for screening for lipoid disorders: Secondary | ICD-10-CM | POA: Diagnosis not present

## 2015-06-11 LAB — LIPID PANEL
Cholesterol: 235 mg/dL — ABNORMAL HIGH (ref 0–200)
HDL: 56.3 mg/dL (ref >39.00)
LDL CALC: 157 mg/dL — AB (ref 0–99)
NonHDL: 178.86
TRIGLYCERIDES: 111 mg/dL (ref 0.0–149.0)
Total CHOL/HDL Ratio: 4
VLDL: 22.2 mg/dL (ref 0.0–40.0)

## 2015-06-11 LAB — CBC WITH DIFFERENTIAL/PLATELET
BASOS PCT: 0.5 % (ref 0.0–3.0)
Basophils Absolute: 0 10*3/uL (ref 0.0–0.1)
EOS ABS: 0.1 10*3/uL (ref 0.0–0.7)
EOS PCT: 1.8 % (ref 0.0–5.0)
HEMATOCRIT: 43.8 % (ref 39.0–52.0)
HEMOGLOBIN: 14.9 g/dL (ref 13.0–17.0)
LYMPHS PCT: 25.3 % (ref 12.0–46.0)
Lymphs Abs: 1.8 10*3/uL (ref 0.7–4.0)
MCHC: 34 g/dL (ref 30.0–36.0)
MCV: 90 fl (ref 78.0–100.0)
Monocytes Absolute: 0.4 10*3/uL (ref 0.1–1.0)
Monocytes Relative: 6.2 % (ref 3.0–12.0)
NEUTROS PCT: 66.2 % (ref 43.0–77.0)
Neutro Abs: 4.7 10*3/uL (ref 1.4–7.7)
Platelets: 249 10*3/uL (ref 150.0–400.0)
RBC: 4.87 Mil/uL (ref 4.22–5.81)
RDW: 14 % (ref 11.5–15.5)
WBC: 7.1 10*3/uL (ref 4.0–10.5)

## 2015-06-11 LAB — BASIC METABOLIC PANEL
BUN: 23 mg/dL (ref 6–23)
CALCIUM: 9.7 mg/dL (ref 8.4–10.5)
CO2: 31 mEq/L (ref 19–32)
Chloride: 102 mEq/L (ref 96–112)
Creatinine, Ser: 0.94 mg/dL (ref 0.40–1.50)
GFR: 84.45 mL/min (ref >60.00)
Glucose, Bld: 97 mg/dL (ref 70–99)
Potassium: 4.6 mEq/L (ref 3.5–5.1)
Sodium: 140 mEq/L (ref 135–145)

## 2015-06-11 LAB — HEPATIC FUNCTION PANEL
ALBUMIN: 4.9 g/dL (ref 3.5–5.2)
ALT: 23 U/L (ref 0–53)
AST: 23 U/L (ref 0–37)
Alkaline Phosphatase: 57 U/L (ref 39–117)
Bilirubin, Direct: 0.1 mg/dL (ref 0.0–0.3)
TOTAL PROTEIN: 7.3 g/dL (ref 6.0–8.3)
Total Bilirubin: 0.6 mg/dL (ref 0.2–1.2)

## 2015-06-11 LAB — TSH: TSH: 1.11 u[IU]/mL (ref 0.35–4.50)

## 2015-06-11 NOTE — Progress Notes (Signed)
Orders placed for labs

## 2015-06-14 ENCOUNTER — Other Ambulatory Visit: Payer: Self-pay | Admitting: Internal Medicine

## 2015-06-14 DIAGNOSIS — E7800 Pure hypercholesterolemia, unspecified: Secondary | ICD-10-CM

## 2015-06-14 NOTE — Progress Notes (Signed)
Order placed for f/u liver panel.  

## 2015-06-16 ENCOUNTER — Encounter: Payer: Self-pay | Admitting: *Deleted

## 2015-06-21 ENCOUNTER — Encounter: Payer: Self-pay | Admitting: Internal Medicine

## 2015-06-21 NOTE — Assessment & Plan Note (Signed)
Had CT - unrevealing.  S/p surgery.  Persistent pain intermittently.  Notices when blows leaves, etc.  No bowel issues.  Discussed further w/up.  He declines at this time.  Urology evalauted.

## 2015-06-21 NOTE — Assessment & Plan Note (Signed)
S/p surgery.  Followed by urology.  Stable.  On ditropan.

## 2015-06-21 NOTE — Assessment & Plan Note (Signed)
Some minimal nasal congestion, etc.  On allegra.  Add nasacort as directed.  Follow.

## 2015-06-21 NOTE — Assessment & Plan Note (Signed)
Followed by Dr Gaynelle Arabian ([facility]).  Stable.  On ditropan for bladder.  S/p surgery - prostate.

## 2015-07-03 ENCOUNTER — Telehealth: Payer: Self-pay | Admitting: Internal Medicine

## 2015-07-03 NOTE — Telephone Encounter (Signed)
Left msg to call office to schedule AWV with Den[facility]/msn °

## 2015-07-27 DIAGNOSIS — D044 Carcinoma in situ of skin of scalp and neck: Secondary | ICD-10-CM | POA: Diagnosis not present

## 2015-10-06 ENCOUNTER — Telehealth: Payer: Self-pay | Admitting: Internal Medicine

## 2015-10-06 ENCOUNTER — Other Ambulatory Visit: Payer: Self-pay | Admitting: Internal Medicine

## 2015-10-06 NOTE — Telephone Encounter (Signed)
Refill request sent to provider.

## 2015-10-06 NOTE — Telephone Encounter (Signed)
Last refilled 09/2014. Please advise?

## 2015-10-06 NOTE — Telephone Encounter (Signed)
Pt called about needing a refill for hydrocortisone (PROCTOZONE-HC) 2.5 % rectal cream.   Pharmacy is [place], [address]   Call pt @ [phone]. Thank you!

## 2015-10-07 MED ORDER — HYDROCORTISONE 2.5 % RE CREA: TOPICAL_CREAM | Freq: Every day | RECTAL | Status: DC | PRN

## 2015-10-07 NOTE — Telephone Encounter (Signed)
Duplicate.  ok'd refill x 1.

## 2015-10-07 NOTE — Telephone Encounter (Signed)
I am ok to refill medication, but please confirm with pt no active bleeding or acute problems.  Thanks

## 2015-10-07 NOTE — Telephone Encounter (Signed)
ok'd refill x 1.   

## 2015-10-07 NOTE — Telephone Encounter (Signed)
Pt states that there is no bleeding or acute issues. Still the same

## 2015-11-20 DIAGNOSIS — I700 Atherosclerosis of aorta: Secondary | ICD-10-CM | POA: Diagnosis not present

## 2015-11-20 DIAGNOSIS — I10 Essential (primary) hypertension: Secondary | ICD-10-CM | POA: Diagnosis not present

## 2015-11-20 DIAGNOSIS — I723 Aneurysm of iliac artery: Secondary | ICD-10-CM | POA: Diagnosis not present

## 2015-11-27 IMAGING — CT CT ABD-PELV W/ CM
2 of 7 series · 15 of 46 positions shown, 17 images · IV contrast (isovue)
Comparison: None.

CLINICAL DATA: Left lower quadrant pain, status post right inguinal
hernia repair, pelvic pain

EXAM:
CT ABDOMEN AND PELVIS WITH CONTRAST
TECHNIQUE: Multidetector CT imaging of the abdomen and pelvis was performed
using the standard protocol following bolus administration of
intravenous contrast.
CONTRAST:  100 cc Isovue

[Series 2: routine abd pel with · axial · 0.68mm/px · z∈[-384,+11]mm · 12 of 93 slices shown, 14 images]
[im 7/93  soft-tissue]
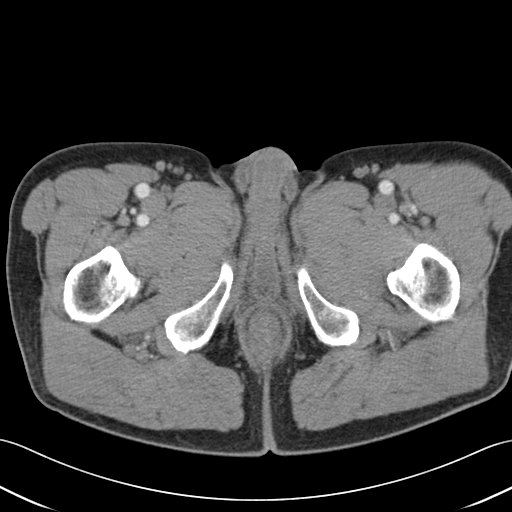
[im 7/93  bone]
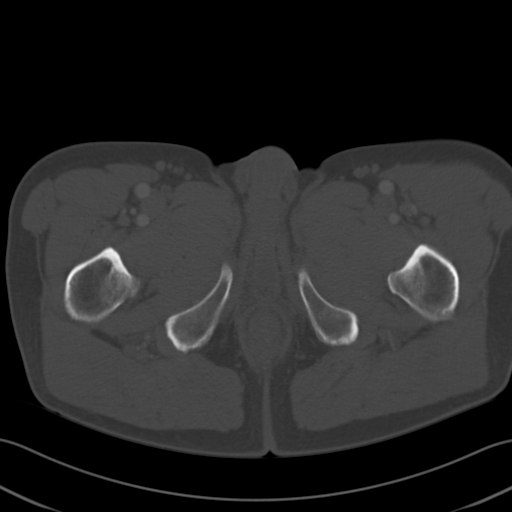
[im 13/93  soft-tissue]
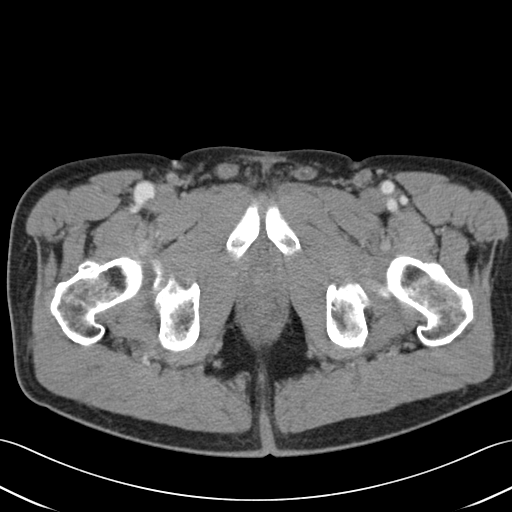
[im 19/93  soft-tissue]
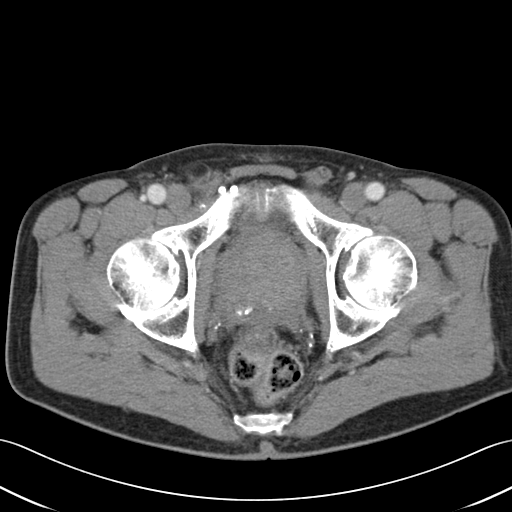
[im 31/93  soft-tissue]
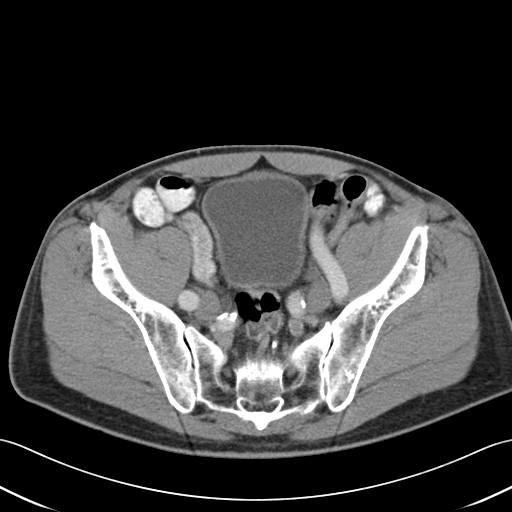
[im 37/93  soft-tissue]
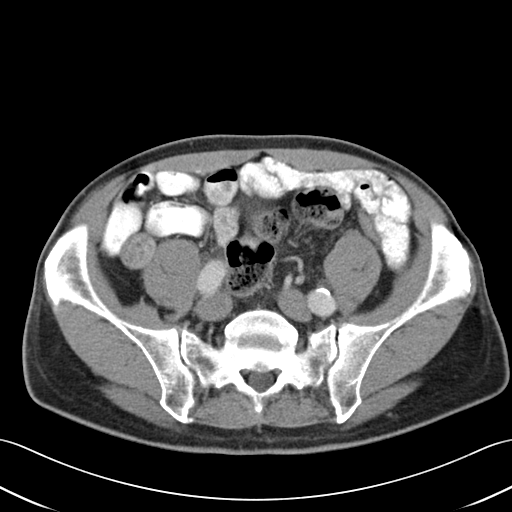
[im 43/93  soft-tissue]
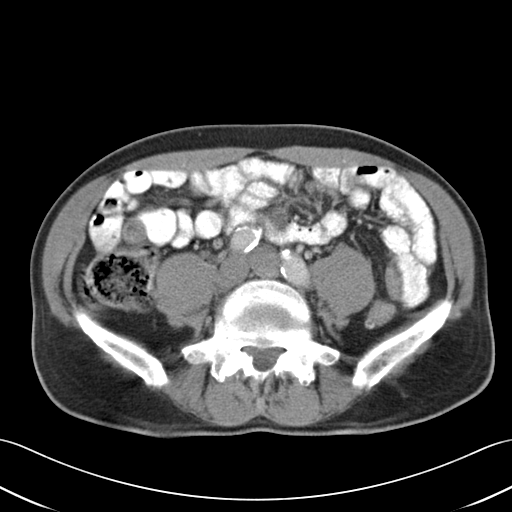
[im 50/93  soft-tissue]
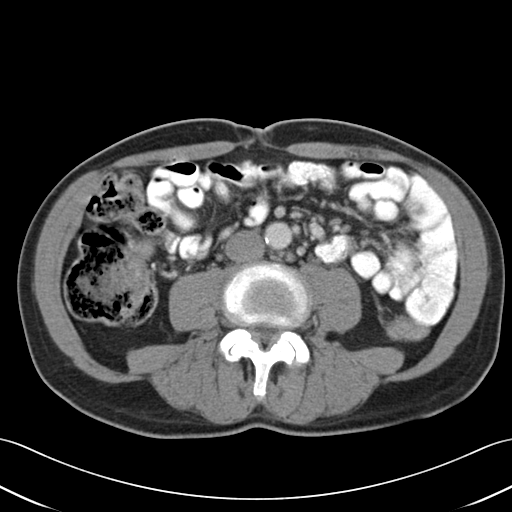
[im 56/93  soft-tissue]
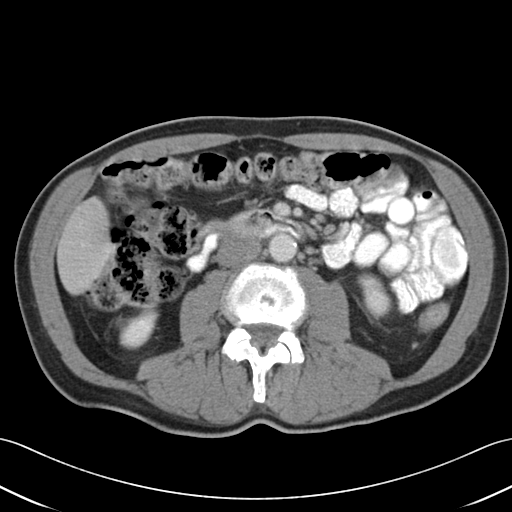
[im 62/93  soft-tissue]
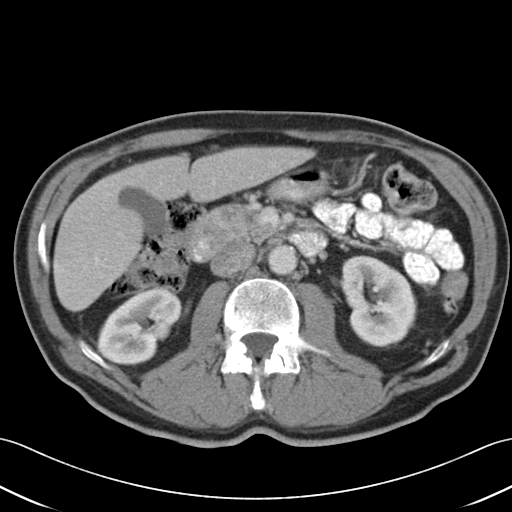
[im 62/93  bone]
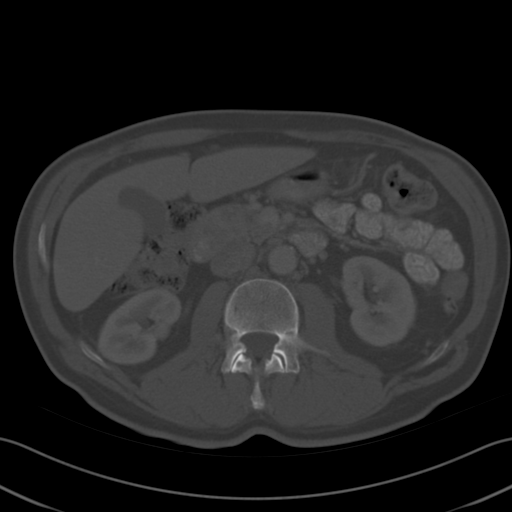
[im 74/93  soft-tissue]
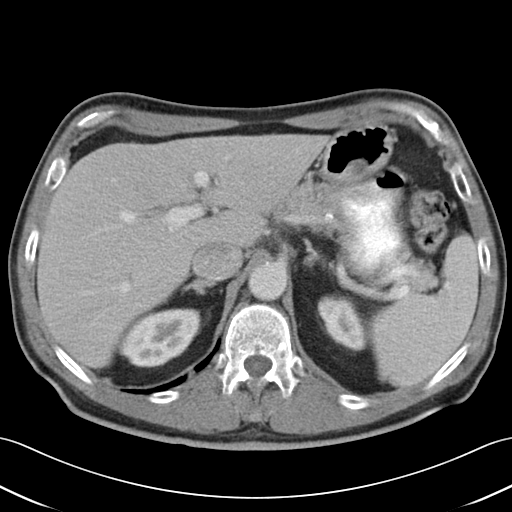
[im 80/93  soft-tissue]
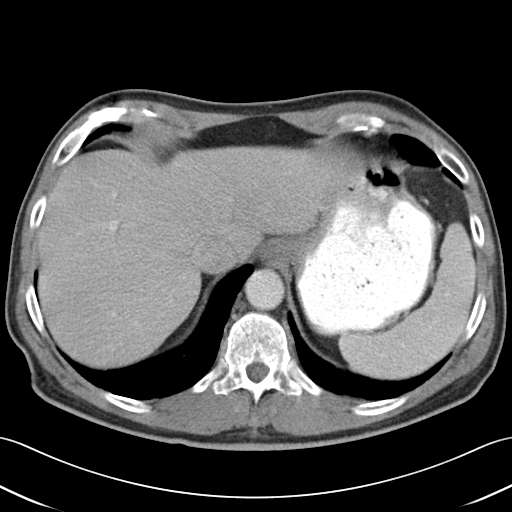
[im 86/93  soft-tissue]
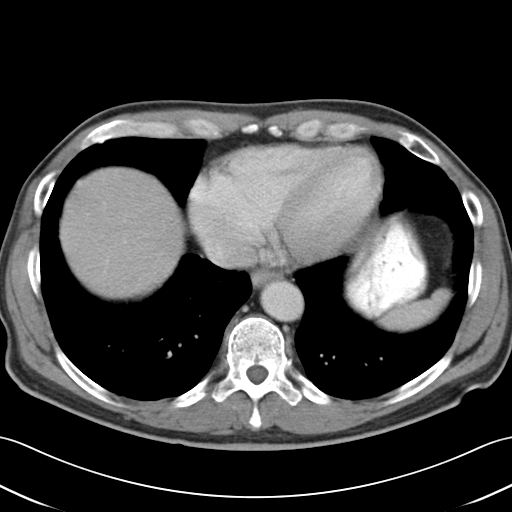

[Series 10: cor routine abd pel with · coronal · 0.70mm/px · 3 of 101 slices shown]
[im 34/101  soft-tissue]
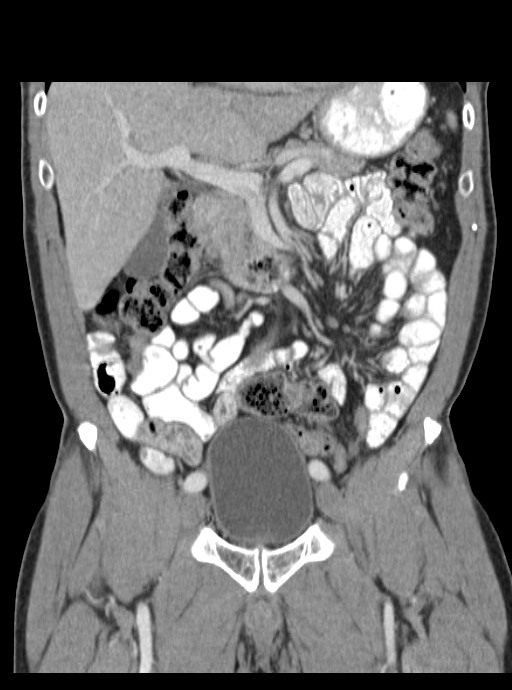
[im 45/101  soft-tissue]
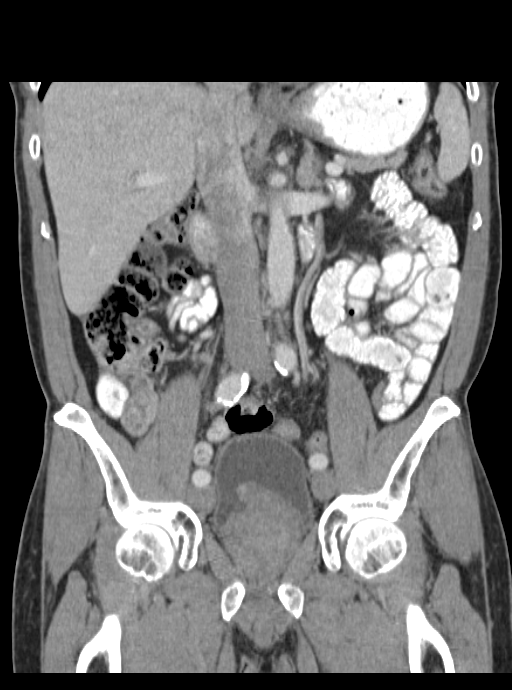
[im 56/101  soft-tissue]
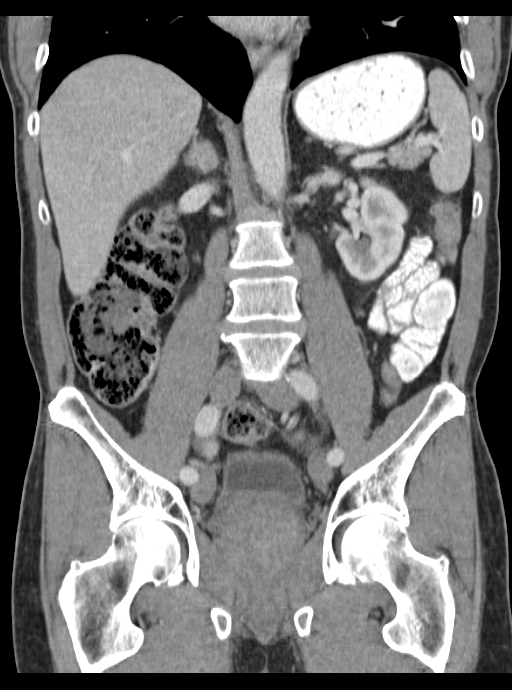

[15 of 46 positions shown; findings below may reference images not displayed]

FINDINGS: Lung bases are unremarkable.  Tiny hiatal hernia.

Sagittal images of the spine shows mild degenerative changes lumbar
spine. Mild disc space flattening at L4-L5 and L5-S1 level. There is
about 4 mm anterolisthesis L4 on L5 vertebral body.

Enhanced liver shows no focal mass. No calcified gallstones are
noted within gallbladder. The pancreas, spleen and adrenal glands
are unremarkable. Kidneys are symmetrical in size and enhancement.
No hydronephrosis or hydroureter. There is a cyst in midpole
anterior aspect of the left kidney measures 1.3 cm. Delayed renal
images shows bilateral renal symmetrical excretion. Bilateral
visualized proximal ureter is unremarkable. Atherosclerotic
calcifications of abdominal aorta and common iliac arteries are
noted. There is aneurysmal dilatation of common iliac arteries. The
right iliac artery measures 1.6 cm. Left common iliac artery
measures 1.6 cm in diameter.

Abundant stool noted in right colon. There is no pericecal
inflammation. The terminal ileum is unremarkable. No ascites or free
air. No adenopathy.

Scattered diverticula are noted in descending colon. Diverticula are
noted in sigmoid colon. No evidence of acute diverticulitis. There
is markedly enlarged prostate gland with multifocal polypoid
indentation of urinary bladder base. Prostate gland measures 7.5 by
6.6 cm in diameter. Correlation with urology exam is recommended.
Bilateral distal ureter is unremarkable. No calcified calculi are
noted within urinary bladder. Postsurgical changes post hernia
repair noted in right inguinal region. No destructive bony lesions
are noted within pelvis. There is no recurrent inguinal or scrotal
hernia.
IMPRESSION: 1. No acute inflammatory process within abdomen or pelvis.
2. No hydronephrosis or hydroureter.
3. There is aneurysmal dilatation of bilateral common iliac arteries
measuring 1.6 cm in diameter.
4. Markedly enlarged prostate gland with polypoid indentation of
urinary bladder base. Correlation with urology exam is recommended.
5. No small bowel obstruction.
6. Descending colon and sigmoid colon diverticula are noted. No
evidence of colitis or diverticulitis. No pericecal inflammation.
Unremarkable terminal ileum.
7. Post hernia repair changes right inguinal region.

## 2015-12-08 ENCOUNTER — Ambulatory Visit (INDEPENDENT_AMBULATORY_CARE_PROVIDER_SITE_OTHER): Payer: PPO | Admitting: Internal Medicine

## 2015-12-08 ENCOUNTER — Encounter: Payer: Self-pay | Admitting: Internal Medicine

## 2015-12-08 VITALS — BP 124/62 | HR 64 | Temp 98.5°F | Resp 12 | Wt 165.0 lb

## 2015-12-08 DIAGNOSIS — R1032 Left lower quadrant pain: Secondary | ICD-10-CM

## 2015-12-08 DIAGNOSIS — Z91048 Other nonmedicinal substance allergy status: Secondary | ICD-10-CM

## 2015-12-08 DIAGNOSIS — Z1211 Encounter for screening for malignant neoplasm of colon: Secondary | ICD-10-CM

## 2015-12-08 DIAGNOSIS — Z0000 Encounter for general adult medical examination without abnormal findings: Secondary | ICD-10-CM

## 2015-12-08 DIAGNOSIS — E7800 Pure hypercholesterolemia, unspecified: Secondary | ICD-10-CM | POA: Diagnosis not present

## 2015-12-08 DIAGNOSIS — Z9109 Other allergy status, other than to drugs and biological substances: Secondary | ICD-10-CM

## 2015-12-08 DIAGNOSIS — M1990 Unspecified osteoarthritis, unspecified site: Secondary | ICD-10-CM

## 2015-12-08 LAB — HEPATIC FUNCTION PANEL
ALT: 15 U/L (ref 0–53)
AST: 19 U/L (ref 0–37)
Albumin: 4.7 g/dL (ref 3.5–5.2)
Alkaline Phosphatase: 52 U/L (ref 39–117)
BILIRUBIN DIRECT: 0.1 mg/dL (ref 0.0–0.3)
Total Bilirubin: 0.6 mg/dL (ref 0.2–1.2)
Total Protein: 7.1 g/dL (ref 6.0–8.3)

## 2015-12-08 LAB — BASIC METABOLIC PANEL
BUN: 18 mg/dL (ref 6–23)
CO2: 32 mEq/L (ref 19–32)
Calcium: 9.8 mg/dL (ref 8.4–10.5)
Chloride: 102 mEq/L (ref 96–112)
Creatinine, Ser: 1.09 mg/dL (ref 0.40–1.50)
GFR: 71.09 mL/min (ref >60.00)
Glucose, Bld: 94 mg/dL (ref 70–99)
POTASSIUM: 4.4 meq/L (ref 3.5–5.1)
SODIUM: 140 meq/L (ref 135–145)

## 2015-12-08 LAB — LIPID PANEL
CHOL/HDL RATIO: 4
Cholesterol: 221 mg/dL — ABNORMAL HIGH (ref 0–200)
HDL: 52.4 mg/dL (ref >39.00)
LDL CALC: 153 mg/dL — AB (ref 0–99)
NonHDL: 169.03
Triglycerides: 79 mg/dL (ref 0.0–149.0)
VLDL: 15.8 mg/dL (ref 0.0–40.0)

## 2015-12-08 NOTE — Assessment & Plan Note (Signed)
Discussed with him today.  Discussed need for f/u colonoscopy.  States he did not think was due yet.  Per our notes, was due in 2015.  Will check with GI.

## 2015-12-08 NOTE — Assessment & Plan Note (Signed)
Not an issue for him now.  Follow.

## 2015-12-08 NOTE — Assessment & Plan Note (Signed)
No significant neck pain now.  Has pain base of great toes.  Takes mobic prn.  Discussed supports.  Discussed short course of steady antiinflammatories.  Follow.  Will notify me if persistent problems.

## 2015-12-08 NOTE — Assessment & Plan Note (Signed)
Desired not to start crestor.  Has adjusted diet.  Lost weight.  Check lipid panel.

## 2015-12-08 NOTE — Assessment & Plan Note (Signed)
Controlled.  

## 2015-12-08 NOTE — Progress Notes (Signed)
[name], male   DOB: 1945/11/28, 70 y.o.   MRN: [id number]   Subjective:    [name], male    DOB: 1945/04/27, 70 y.o.   MRN: [id number]  HPI  Patient here for a scheduled follow up.  He was just evaluated by vascular surgery.  F/u aortic/iliac aneurysm.  Stable.  Recommended f/u in one year.  He is doing well.  Feels good.  Has been very physically active.  No chest pain.  No sob.  No acid reflux reported.  No significant abdominal pain or cramping.  Bowels stable.  He has been having bilateral foot pain.  Actually pain localized to base of great toes.  Takes tylenol.  Helps.  Takes meloxicam prn.     Past Medical History:  Diagnosis Date  . Arthritis   . Diverticulosis, sigmoid   . Environmental allergies   . Frequency of urination   . H/O sleep apnea    S/P  OSA surgery -- resolved  . Hemorrhoids   . History of BPH   . History of melanoma excision    back  . History of nephritis    age 37  . History of squamous cell carcinoma excision    right arm, leg, face  . History of urinary retention    secondary bph with obstruction  . Urethral stricture    Past Surgical History:  Procedure Laterality Date  . CYSTOSCOPY WITH RETROGRADE URETHROGRAM N/A 05/08/2014   Procedure: CYSTOSCOPY WITH RETROGRADE URETHROGRAM;  Surgeon: Alexis Frock, MD;  Location: [facility];  Service: Urology;  Laterality: N/A;  . CYSTOSCOPY WITH URETHRAL DILATATION N/A 05/08/2014   Procedure: CYSTOSCOPY WITH URETHRAL DILATATION;  Surgeon: Alexis Frock, MD;  Location: [facility];  Service: Urology;  Laterality: N/A;  . KNEE ARTHROSCOPY W/ MENISCECTOMY Right x2   last one 2005  . LAPAROSCOPIC INGUINAL HERNIA REPAIR Right 2007 (approx)  . ORIF  RIGHT WRIST FX  2008   retained hardware  . ROBOT ASSISTED LAPAROSCOPIC RADICAL PROSTATECTOMY N/A 03/19/2014   Procedure: ROBOTIC ASSISTED LAPAROSCOPIC SIMPLE PROSTATECTOMY;  Surgeon: Alexis Frock, MD;   Location: [facility];  Service: Urology;  Laterality: N/A;  . TONSILLECTOMY  age 37  . UVULOPALATOPHARYNGOPLASTY  x5  last one 1995   Family History  Problem Relation Age of Onset  . Heart disease Father     CABG x 4  . Arthritis Father    Social History   Social History  . Marital status: Married    Spouse name: N/A  . Number of children: N/A  . Years of education: N/A   Social History Main Topics  . Smoking status: Never Smoker  . Smokeless tobacco: Never Used  . Alcohol use 0.0 oz/week     Comment: rare  . Drug use: No  . Sexual activity: Not Asked   Other Topics Concern  . None   Social History Narrative  . None    Outpatient Encounter Prescriptions as of 12/08/2015  Medication Sig  . fexofenadine (ALLEGRA) 180 MG tablet Take 1 tablet (180 mg total) by mouth daily as needed for allergies or rhinitis. (Patient taking differently: Take 180 mg by mouth every morning. )  . glucosamine-chondroitin 500-400 MG tablet Take 1 tablet by mouth 2 (two) times daily.  . hydrocortisone (PROCTOZONE-HC) 2.5 % rectal cream Place rectally daily as needed for hemorrhoids or itching.  . meloxicam (MOBIC) 15 MG tablet Take 15 mg by mouth daily as  needed for pain.   . Multiple Vitamins-Minerals (CENTRUM SILVER ADULT 50+ PO) Take 1 tablet by mouth daily.  Marland Kitchen oxybutynin (DITROPAN) 5 MG tablet TK 1 T PO Q 8 H PRF URINARY FREQUENCY OR URGENCY   No facility-administered encounter medications on file as of 12/08/2015.     Review of Systems  Constitutional: Negative for appetite change.       Has lost weight.  Watching diet.  More physically active.   HENT: Negative for congestion and sinus pressure.   Respiratory: Negative for cough, chest tightness and shortness of breath.   Cardiovascular: Negative for chest pain, palpitations and leg swelling.  Gastrointestinal: Negative for abdominal pain, diarrhea, nausea and vomiting.  Genitourinary: Negative for difficulty urinating and dysuria.    Musculoskeletal: Negative for back pain and joint swelling.       Pain base of toe as outlined.   Skin: Negative for color change and rash.  Neurological: Negative for dizziness, light-headedness and headaches.  Psychiatric/Behavioral: Negative for agitation and dysphoric mood.       Objective:    Physical Exam  Constitutional: He appears well-developed and well-nourished. No distress.  HENT:  Nose: Nose normal.  Mouth/Throat: Oropharynx is clear and moist.  Neck: Neck supple. No thyromegaly present.  Cardiovascular: Normal rate and regular rhythm.   Pulmonary/Chest: Effort normal and breath sounds normal. No respiratory distress.  Abdominal: Soft. Bowel sounds are normal. There is no tenderness.  Musculoskeletal: He exhibits no edema or tenderness.  Lymphadenopathy:    He has no cervical adenopathy.  Skin: No rash noted. No erythema.  Psychiatric: He has a normal mood and affect. His behavior is normal.    BP 124/62   Pulse 64   Temp 98.5 F (36.9 C)   Resp 12   Wt 165 lb (74.8 kg)   BMI 24.19 kg/m  Wt Readings from Last 3 Encounters:  12/08/15 165 lb (74.8 kg)  06/10/15 174 lb 8 oz (79.2 kg)  07/08/14 177 lb 4 oz (80.4 kg)     Lab Results  Component Value Date   WBC 7.1 06/11/2015   HGB 14.9 06/11/2015   HCT 43.8 06/11/2015   PLT 249.0 06/11/2015   GLUCOSE 97 06/11/2015   CHOL 235 (H) 06/11/2015   TRIG 111.0 06/11/2015   HDL 56.30 06/11/2015   LDLCALC 157 (H) 06/11/2015   ALT 23 06/11/2015   AST 23 06/11/2015   NA 140 06/11/2015   K 4.6 06/11/2015   CL 102 06/11/2015   CREATININE 0.94 06/11/2015   BUN 23 06/11/2015   CO2 31 06/11/2015   TSH 1.11 06/11/2015       Assessment & Plan:   Problem List Items Addressed This Visit    Arthritis    No significant neck pain now.  Has pain base of great toes.  Takes mobic prn.  Discussed supports.  Discussed short course of steady antiinflammatories.  Follow.  Will notify me if persistent problems.         Environmental allergies    Controlled.        Health care maintenance    Discussed with him today.  Discussed need for f/u colonoscopy.  States he did not think was due yet.  Per our notes, was due in 2015.  Will check with GI.       Hypercholesterolemia    Desired not to start crestor.  Has adjusted diet.  Lost weight.  Check lipid panel.        Relevant  Orders   Hepatic function panel   Lipid panel   Basic metabolic panel   Left lower quadrant pain    Not an issue for him now.  Follow.        Other Visit Diagnoses    Colon cancer screening    -  Primary       Einar Pheasant, MD

## 2015-12-08 NOTE — Progress Notes (Signed)
Pre visit review using our clinic review tool, if applicable. No additional management support is needed unless otherwise documented below in the visit note. 

## 2015-12-09 ENCOUNTER — Encounter: Payer: Self-pay | Admitting: *Deleted

## 2015-12-15 ENCOUNTER — Telehealth: Payer: Self-pay | Admitting: *Deleted

## 2015-12-15 DIAGNOSIS — E7800 Pure hypercholesterolemia, unspecified: Secondary | ICD-10-CM

## 2015-12-15 MED ORDER — ROSUVASTATIN CALCIUM 5 MG PO TABS: 5.0000 mg | ORAL_TABLET | Freq: Every day | ORAL | 2 refills | Status: DC

## 2015-12-15 NOTE — Telephone Encounter (Signed)
Lab order placed & Medication sent to [facility]

## 2015-12-30 ENCOUNTER — Telehealth: Payer: Self-pay | Admitting: Internal Medicine

## 2015-12-30 NOTE — Telephone Encounter (Signed)
My chart message sent to pt regarding f/u colonoscopy.  Overdue.

## 2016-01-01 NOTE — Telephone Encounter (Signed)
Mailed my chart message to patient

## 2016-01-04 DIAGNOSIS — X32XXXA Exposure to sunlight, initial encounter: Secondary | ICD-10-CM | POA: Diagnosis not present

## 2016-01-04 DIAGNOSIS — Z85828 Personal history of other malignant neoplasm of skin: Secondary | ICD-10-CM | POA: Diagnosis not present

## 2016-01-04 DIAGNOSIS — D044 Carcinoma in situ of skin of scalp and neck: Secondary | ICD-10-CM | POA: Diagnosis not present

## 2016-01-04 DIAGNOSIS — L570 Actinic keratosis: Secondary | ICD-10-CM | POA: Diagnosis not present

## 2016-01-04 DIAGNOSIS — Z08 Encounter for follow-up examination after completed treatment for malignant neoplasm: Secondary | ICD-10-CM | POA: Diagnosis not present

## 2016-01-04 DIAGNOSIS — D485 Neoplasm of uncertain behavior of skin: Secondary | ICD-10-CM | POA: Diagnosis not present

## 2016-01-04 DIAGNOSIS — D225 Melanocytic nevi of trunk: Secondary | ICD-10-CM | POA: Diagnosis not present

## 2016-01-04 DIAGNOSIS — Z85820 Personal history of malignant melanoma of skin: Secondary | ICD-10-CM | POA: Diagnosis not present

## 2016-01-12 DIAGNOSIS — N401 Enlarged prostate with lower urinary tract symptoms: Secondary | ICD-10-CM | POA: Diagnosis not present

## 2016-01-12 DIAGNOSIS — Z125 Encounter for screening for malignant neoplasm of prostate: Secondary | ICD-10-CM | POA: Diagnosis not present

## 2016-01-12 DIAGNOSIS — R3912 Poor urinary stream: Secondary | ICD-10-CM | POA: Diagnosis not present

## 2016-01-12 DIAGNOSIS — R3915 Urgency of urination: Secondary | ICD-10-CM | POA: Diagnosis not present

## 2016-02-01 ENCOUNTER — Other Ambulatory Visit: Payer: Self-pay | Admitting: Internal Medicine

## 2016-02-02 ENCOUNTER — Ambulatory Visit (INDEPENDENT_AMBULATORY_CARE_PROVIDER_SITE_OTHER): Payer: PPO

## 2016-02-02 ENCOUNTER — Telehealth: Payer: Self-pay | Admitting: Internal Medicine

## 2016-02-02 ENCOUNTER — Other Ambulatory Visit (INDEPENDENT_AMBULATORY_CARE_PROVIDER_SITE_OTHER): Payer: PPO

## 2016-02-02 DIAGNOSIS — E7800 Pure hypercholesterolemia, unspecified: Secondary | ICD-10-CM | POA: Diagnosis not present

## 2016-02-02 DIAGNOSIS — Z23 Encounter for immunization: Secondary | ICD-10-CM

## 2016-02-02 LAB — HEPATIC FUNCTION PANEL
ALK PHOS: 54 U/L (ref 39–117)
ALT: 18 U/L (ref 0–53)
AST: 19 U/L (ref 0–37)
Albumin: 4.6 g/dL (ref 3.5–5.2)
BILIRUBIN DIRECT: 0.1 mg/dL (ref 0.0–0.3)
TOTAL PROTEIN: 6.9 g/dL (ref 6.0–8.3)
Total Bilirubin: 0.6 mg/dL (ref 0.2–1.2)

## 2016-02-02 NOTE — Progress Notes (Signed)
Patient came in for tdap immunization.  Received in Left deltoid.  Tolerated well.

## 2016-02-03 ENCOUNTER — Other Ambulatory Visit: Payer: Self-pay | Admitting: Internal Medicine

## 2016-02-03 DIAGNOSIS — E7800 Pure hypercholesterolemia, unspecified: Secondary | ICD-10-CM

## 2016-02-03 NOTE — Progress Notes (Signed)
Orders placed for f/u labs.  

## 2016-03-23 DIAGNOSIS — L905 Scar conditions and fibrosis of skin: Secondary | ICD-10-CM | POA: Diagnosis not present

## 2016-03-23 DIAGNOSIS — D044 Carcinoma in situ of skin of scalp and neck: Secondary | ICD-10-CM | POA: Diagnosis not present

## 2016-04-05 ENCOUNTER — Other Ambulatory Visit (INDEPENDENT_AMBULATORY_CARE_PROVIDER_SITE_OTHER): Payer: PPO

## 2016-04-05 DIAGNOSIS — E7800 Pure hypercholesterolemia, unspecified: Secondary | ICD-10-CM

## 2016-04-05 LAB — LIPID PANEL
Cholesterol: 171 mg/dL (ref 0–200)
HDL: 52.1 mg/dL (ref >39.00)
LDL CALC: 105 mg/dL — AB (ref 0–99)
NONHDL: 118.82
Total CHOL/HDL Ratio: 3
Triglycerides: 69 mg/dL (ref 0.0–149.0)
VLDL: 13.8 mg/dL (ref 0.0–40.0)

## 2016-04-05 LAB — COMPREHENSIVE METABOLIC PANEL
ALT: 15 U/L (ref 0–53)
AST: 22 U/L (ref 0–37)
Albumin: 4.8 g/dL (ref 3.5–5.2)
Alkaline Phosphatase: 51 U/L (ref 39–117)
BUN: 20 mg/dL (ref 6–23)
CHLORIDE: 104 meq/L (ref 96–112)
CO2: 32 mEq/L (ref 19–32)
CREATININE: 1.04 mg/dL (ref 0.40–1.50)
Calcium: 9.8 mg/dL (ref 8.4–10.5)
GFR: 74.97 mL/min (ref >60.00)
Glucose, Bld: 102 mg/dL — ABNORMAL HIGH (ref 70–99)
POTASSIUM: 4.2 meq/L (ref 3.5–5.1)
SODIUM: 142 meq/L (ref 135–145)
Total Bilirubin: 0.6 mg/dL (ref 0.2–1.2)
Total Protein: 7.3 g/dL (ref 6.0–8.3)

## 2016-04-06 ENCOUNTER — Encounter: Payer: Self-pay | Admitting: Internal Medicine

## 2016-04-19 ENCOUNTER — Other Ambulatory Visit: Payer: Self-pay | Admitting: Internal Medicine

## 2016-06-06 DIAGNOSIS — M2241 Chondromalacia patellae, right knee: Secondary | ICD-10-CM | POA: Diagnosis not present

## 2016-06-06 DIAGNOSIS — M1711 Unilateral primary osteoarthritis, right knee: Secondary | ICD-10-CM | POA: Diagnosis not present

## 2016-06-09 ENCOUNTER — Encounter: Payer: Self-pay | Admitting: Internal Medicine

## 2016-06-09 ENCOUNTER — Ambulatory Visit (INDEPENDENT_AMBULATORY_CARE_PROVIDER_SITE_OTHER): Payer: PPO | Admitting: Internal Medicine

## 2016-06-09 VITALS — BP 120/60 | HR 68 | Temp 98.7°F | Resp 16 | Ht 69.0 in | Wt 175.0 lb

## 2016-06-09 DIAGNOSIS — C4490 Unspecified malignant neoplasm of skin, unspecified: Secondary | ICD-10-CM

## 2016-06-09 DIAGNOSIS — N401 Enlarged prostate with lower urinary tract symptoms: Secondary | ICD-10-CM

## 2016-06-09 DIAGNOSIS — M1990 Unspecified osteoarthritis, unspecified site: Secondary | ICD-10-CM | POA: Diagnosis not present

## 2016-06-09 DIAGNOSIS — M79673 Pain in unspecified foot: Secondary | ICD-10-CM

## 2016-06-09 DIAGNOSIS — Z23 Encounter for immunization: Secondary | ICD-10-CM

## 2016-06-09 DIAGNOSIS — Z0000 Encounter for general adult medical examination without abnormal findings: Secondary | ICD-10-CM | POA: Diagnosis not present

## 2016-06-09 DIAGNOSIS — N138 Other obstructive and reflux uropathy: Secondary | ICD-10-CM | POA: Diagnosis not present

## 2016-06-09 DIAGNOSIS — E7800 Pure hypercholesterolemia, unspecified: Secondary | ICD-10-CM

## 2016-06-09 NOTE — Progress Notes (Signed)
Pre-visit discussion using our clinic review tool. No additional management support is needed unless otherwise documented below in the visit note.  

## 2016-06-09 NOTE — Progress Notes (Signed)
[name], male   DOB: July 02, 1945, 71 y.o.   MRN: [id number]   Subjective:    [name], male    DOB: [birth year], 71 y.o.   MRN: [id number]  HPI  Patient with past history of BPH and hypercholesterolemia.  He comes in today to follow up on these issues as well as for a complete physical exam.  He is doing well.  Trying to stay active.  Not exercising as much now secondary to the weather.  Trying to watch his diet.  No chest pain.  No sob.  No acid reflux.  No abdominal pain or cramping.  Bowels stable.  Some knee pain.  Saw  Dr Mack Guise.  Took meloxicam.     Past Medical History:  Diagnosis Date  . Arthritis   . Diverticulosis, sigmoid   . Environmental allergies   . Frequency of urination   . H/O sleep apnea    S/P  OSA surgery -- resolved  . Hemorrhoids   . History of BPH   . History of melanoma excision    back  . History of nephritis    age 80  . History of squamous cell carcinoma excision    right arm, leg, face  . History of urinary retention    secondary bph with obstruction  . Urethral stricture    Past Surgical History:  Procedure Laterality Date  . CYSTOSCOPY WITH RETROGRADE URETHROGRAM N/A 05/08/2014   Procedure: CYSTOSCOPY WITH RETROGRADE URETHROGRAM;  Surgeon: Alexis Frock, MD;  Location: [facility];  Service: Urology;  Laterality: N/A;  . CYSTOSCOPY WITH URETHRAL DILATATION N/A 05/08/2014   Procedure: CYSTOSCOPY WITH URETHRAL DILATATION;  Surgeon: Alexis Frock, MD;  Location: [facility];  Service: Urology;  Laterality: N/A;  . KNEE ARTHROSCOPY W/ MENISCECTOMY Right x2   last one 2005  . LAPAROSCOPIC INGUINAL HERNIA REPAIR Right 2007 (approx)  . ORIF  RIGHT WRIST FX  2008   retained hardware  . ROBOT ASSISTED LAPAROSCOPIC RADICAL PROSTATECTOMY N/A 03/19/2014   Procedure: ROBOTIC ASSISTED LAPAROSCOPIC SIMPLE PROSTATECTOMY;  Surgeon: Alexis Frock, MD;  Location: [facility];  Service: Urology;   Laterality: N/A;  . TONSILLECTOMY  age 3  . UVULOPALATOPHARYNGOPLASTY  x5  last one 1995   Family History  Problem Relation Age of Onset  . Heart disease Father     CABG x 4  . Arthritis Father    Social History   Social History  . Marital status: Married    Spouse name: N/A  . Number of children: N/A  . Years of education: N/A   Social History Main Topics  . Smoking status: Never Smoker  . Smokeless tobacco: Never Used  . Alcohol use 0.0 oz/week     Comment: rare  . Drug use: No  . Sexual activity: Not Asked   Other Topics Concern  . None   Social History Narrative  . None    Outpatient Encounter Prescriptions as of 06/09/2016  Medication Sig  . fexofenadine (ALLEGRA) 180 MG tablet Take 1 tablet (180 mg total) by mouth daily as needed for allergies or rhinitis. (Patient taking differently: Take 180 mg by mouth every morning. )  . glucosamine-chondroitin 500-400 MG tablet Take 1 tablet by mouth 2 (two) times daily.  . meloxicam (MOBIC) 15 MG tablet Take 15 mg by mouth 2 (two) times daily.   . Multiple Vitamins-Minerals (CENTRUM SILVER ADULT 50+ PO) Take 1 tablet by mouth daily.  Marland Kitchen oxybutynin (  DITROPAN) 5 MG tablet TK 1 T PO Q 8 H PRF URINARY FREQUENCY OR URGENCY  . PROCTOZONE-HC 2.5 % rectal cream PLACE RECTALLY DAILY AS NEEDED FOR HEMORRHOIDS OR ITCHING  . rosuvastatin (CRESTOR) 5 MG tablet TAKE 1 TABLET BY MOUTH DAILY   No facility-administered encounter medications on file as of 06/09/2016.     Review of Systems  Constitutional: Negative for appetite change and unexpected weight change.  HENT: Negative for congestion and sinus pressure.   Eyes: Negative for pain and visual disturbance.  Respiratory: Negative for cough, chest tightness and shortness of breath.   Cardiovascular: Negative for chest pain, palpitations and leg swelling.  Gastrointestinal: Negative for abdominal pain, diarrhea, nausea and vomiting.  Genitourinary: Negative for difficulty urinating and  dysuria.  Musculoskeletal: Negative for back pain.       Knee pain as outlined.    Skin: Negative for color change and rash.  Neurological: Negative for dizziness, light-headedness and headaches.  Hematological: Negative for adenopathy. Does not bruise/bleed easily.  Psychiatric/Behavioral: Negative for agitation and dysphoric mood.       Objective:    Physical Exam  Constitutional: He is oriented to person, place, and time. He appears well-developed and well-nourished. No distress.  HENT:  Head: Normocephalic and atraumatic.  Nose: Nose normal.  Mouth/Throat: Oropharynx is clear and moist. No oropharyngeal exudate.  Eyes: Conjunctivae are normal. Right eye exhibits no discharge. Left eye exhibits no discharge.  Neck: Neck supple. No thyromegaly present.  Cardiovascular: Normal rate and regular rhythm.   Pulmonary/Chest: Breath sounds normal. No respiratory distress. He has no wheezes.  Abdominal: Soft. Bowel sounds are normal. There is no tenderness.  Genitourinary:  Genitourinary Comments: Followed by urology.  S/p simple prostatectomy.    Musculoskeletal: He exhibits no edema or tenderness.  Lymphadenopathy:    He has no cervical adenopathy.  Neurological: He is alert and oriented to person, place, and time.  Skin: Skin is warm and dry. No rash noted. No erythema.  Psychiatric: He has a normal mood and affect. His behavior is normal.    BP 120/60 (BP Location: Left Arm, Patient Position: Sitting, Cuff Size: Large)   Pulse 68   Temp 98.7 F (37.1 C) (Oral)   Resp 16   Ht 5\' 9"  (1.753 m)   Wt 175 lb (79.4 kg)   SpO2 96%   BMI 25.84 kg/m  Wt Readings from Last 3 Encounters:  06/09/16 175 lb (79.4 kg)  12/08/15 165 lb (74.8 kg)  06/10/15 174 lb 8 oz (79.2 kg)     Lab Results  Component Value Date   WBC 7.1 06/11/2015   HGB 14.9 06/11/2015   HCT 43.8 06/11/2015   PLT 249.0 06/11/2015   GLUCOSE 102 (H) 04/05/2016   CHOL 171 04/05/2016   TRIG 69.0 04/05/2016    HDL 52.10 04/05/2016   LDLCALC 105 (H) 04/05/2016   ALT 15 04/05/2016   AST 22 04/05/2016   NA 142 04/05/2016   K 4.2 04/05/2016   CL 104 04/05/2016   CREATININE 1.04 04/05/2016   BUN 20 04/05/2016   CO2 32 04/05/2016   TSH 1.11 06/11/2015       Assessment & Plan:   Problem List Items Addressed This Visit    Arthritis    Knee pain.  Saw Dr Mack Guise.  Was given meloxicam.        Health care maintenance    Physical today 06/09/16.  Urology follows as outlined.  Hypercholesterolemia - Primary    Low cholesterol diet and exercise.  Follow lipid panel.  On crestor.        Relevant Orders   CBC with Differential/Platelet   Comprehensive metabolic panel   Lipid panel   TSH   Prostatic hyperplasia, benign localized, with obstruction    S/p simple prostatectomy.  Followed by urology.  No further psa screening warranted.        Skin cancer    Sees Dr Evorn Gong.  Has a history of squamous cell and melanoma.  Left neck lesion.  Plans to f/u with Dr Evorn Gong.         Other Visit Diagnoses    Need for pneumococcal vaccination       Relevant Orders   Pneumococcal conjugate vaccine 13-valent IM (Completed)   Pain of foot, unspecified laterality       Relevant Orders   Ambulatory referral to Podiatry       Einar Pheasant, MD

## 2016-06-19 ENCOUNTER — Encounter: Payer: Self-pay | Admitting: Internal Medicine

## 2016-06-19 NOTE — Assessment & Plan Note (Signed)
S/p simple prostatectomy.  Followed by urology.  No further psa screening warranted.

## 2016-06-19 NOTE — Assessment & Plan Note (Signed)
Knee pain.  Saw Dr Mack Guise.  Was given meloxicam.

## 2016-06-19 NOTE — Assessment & Plan Note (Signed)
Physical today 06/09/16.  Urology follows as outlined.

## 2016-06-19 NOTE — Assessment & Plan Note (Signed)
Low cholesterol diet and exercise.  Follow lipid panel.  On crestor.   

## 2016-06-19 NOTE — Assessment & Plan Note (Signed)
Sees Dr Evorn Gong.  Has a history of squamous cell and melanoma.  Left neck lesion.  Plans to f/u with Dr Evorn Gong.

## 2016-06-29 ENCOUNTER — Telehealth: Payer: Self-pay | Admitting: Internal Medicine

## 2016-06-29 DIAGNOSIS — J019 Acute sinusitis, unspecified: Secondary | ICD-10-CM | POA: Diagnosis not present

## 2016-06-29 NOTE — Telephone Encounter (Signed)
Left pt message asking to call Allison back directly at [phone] to schedule AWV. Thanks! °

## 2016-07-05 DIAGNOSIS — M25661 Stiffness of right knee, not elsewhere classified: Secondary | ICD-10-CM | POA: Diagnosis not present

## 2016-07-05 DIAGNOSIS — M25561 Pain in right knee: Secondary | ICD-10-CM | POA: Diagnosis not present

## 2016-07-05 DIAGNOSIS — M25562 Pain in left knee: Secondary | ICD-10-CM | POA: Diagnosis not present

## 2016-07-05 DIAGNOSIS — M25662 Stiffness of left knee, not elsewhere classified: Secondary | ICD-10-CM | POA: Diagnosis not present

## 2016-07-07 DIAGNOSIS — M25562 Pain in left knee: Secondary | ICD-10-CM | POA: Diagnosis not present

## 2016-07-07 DIAGNOSIS — M25662 Stiffness of left knee, not elsewhere classified: Secondary | ICD-10-CM | POA: Diagnosis not present

## 2016-07-07 DIAGNOSIS — M25561 Pain in right knee: Secondary | ICD-10-CM | POA: Diagnosis not present

## 2016-07-07 DIAGNOSIS — M25661 Stiffness of right knee, not elsewhere classified: Secondary | ICD-10-CM | POA: Diagnosis not present

## 2016-07-08 ENCOUNTER — Ambulatory Visit: Payer: Self-pay | Admitting: Podiatry

## 2016-07-11 ENCOUNTER — Ambulatory Visit: Payer: Self-pay | Admitting: Podiatry

## 2016-07-20 ENCOUNTER — Ambulatory Visit (INDEPENDENT_AMBULATORY_CARE_PROVIDER_SITE_OTHER): Payer: PPO | Admitting: Podiatry

## 2016-07-20 ENCOUNTER — Encounter: Payer: Self-pay | Admitting: Podiatry

## 2016-07-20 ENCOUNTER — Ambulatory Visit (INDEPENDENT_AMBULATORY_CARE_PROVIDER_SITE_OTHER): Payer: PPO

## 2016-07-20 VITALS — BP 110/61 | HR 59 | Resp 18

## 2016-07-20 DIAGNOSIS — M205X1 Other deformities of toe(s) (acquired), right foot: Secondary | ICD-10-CM | POA: Diagnosis not present

## 2016-07-20 DIAGNOSIS — M205X2 Other deformities of toe(s) (acquired), left foot: Secondary | ICD-10-CM | POA: Diagnosis not present

## 2016-07-20 DIAGNOSIS — M779 Enthesopathy, unspecified: Secondary | ICD-10-CM

## 2016-07-20 MED ORDER — NONFORMULARY OR COMPOUNDED ITEM: 2 refills | Status: DC

## 2016-07-20 NOTE — Progress Notes (Signed)
   Subjective:    Patient ID: DARL KUSS, male    DOB: [birth year], 71 y.o.   MRN: [id number]  HPI    Review of Systems  HENT: Positive for hearing loss, nosebleeds and sinus pressure.   Respiratory: Positive for cough.   Gastrointestinal:       Kidney disease   Musculoskeletal:       Arthritis  Skin:       Skin cancer  All other systems reviewed and are negative.      Objective:   Physical Exam        Assessment & Plan:

## 2016-07-23 NOTE — Progress Notes (Signed)
[name], male   DOB: April 25, 1946, 71 y.o.   MRN: [id number]   Subjective:  71 year old male presents to the office today for pain in the balls of his feet bilaterally. Patient believes she may have arthritis. Pain is been ongoing for several years now. Patient denies trauma.    Objective/Physical Exam General: The patient is alert and oriented x3 in no acute distress.  Dermatology: Skin is warm, dry and supple bilateral lower extremities. Negative for open lesions or macerations.  Vascular: Palpable pedal pulses bilaterally. No edema or erythema noted. Capillary refill within normal limits.  Neurological: Epicritic and protective threshold grossly intact bilaterally.   Musculoskeletal Exam: Pain on palpation with limited range of motion to the bilateral first MPJs.   Radiographic Exam:  Degenerative changes noted within the first MPJ of the bilateral feet with joint space narrowing and bone spur formation.  Assessment: #1 hallux limitus bilateral   Plan of Care:  #1 Patient was evaluated. #2 today we discussed the pathology of hallux limitus. We also discussed conservative versus surgical management of the pathology. #3 prescription for anti-inflammatory pain cream through [facility] #4 return to clinic when necessary   Edrick Kins, DPM [facility]  Dr. Edrick Kins, [address]                                        [place], [place] [address]                Office [phone]  Fax [phone]

## 2016-08-17 NOTE — Telephone Encounter (Signed)
Spoke to pt. Would like me to call back this Friday to get him scheduled for AWV

## 2016-08-18 ENCOUNTER — Other Ambulatory Visit (INDEPENDENT_AMBULATORY_CARE_PROVIDER_SITE_OTHER): Payer: PPO

## 2016-08-18 DIAGNOSIS — E7800 Pure hypercholesterolemia, unspecified: Secondary | ICD-10-CM

## 2016-08-18 LAB — COMPREHENSIVE METABOLIC PANEL
ALBUMIN: 4.4 g/dL (ref 3.5–5.2)
ALK PHOS: 47 U/L (ref 39–117)
ALT: 15 U/L (ref 0–53)
AST: 19 U/L (ref 0–37)
BUN: 27 mg/dL — ABNORMAL HIGH (ref 6–23)
CALCIUM: 9.4 mg/dL (ref 8.4–10.5)
CO2: 32 mEq/L (ref 19–32)
Chloride: 106 mEq/L (ref 96–112)
Creatinine, Ser: 1.07 mg/dL (ref 0.40–1.50)
GFR: 72.48 mL/min (ref >60.00)
Glucose, Bld: 94 mg/dL (ref 70–99)
POTASSIUM: 4.1 meq/L (ref 3.5–5.1)
Sodium: 142 mEq/L (ref 135–145)
TOTAL PROTEIN: 6.7 g/dL (ref 6.0–8.3)
Total Bilirubin: 0.6 mg/dL (ref 0.2–1.2)

## 2016-08-18 LAB — CBC WITH DIFFERENTIAL/PLATELET
BASOS ABS: 0.1 10*3/uL (ref 0.0–0.1)
Basophils Relative: 1.2 % (ref 0.0–3.0)
EOS PCT: 2.2 % (ref 0.0–5.0)
Eosinophils Absolute: 0.1 10*3/uL (ref 0.0–0.7)
HCT: 42.2 % (ref 39.0–52.0)
Hemoglobin: 14.2 g/dL (ref 13.0–17.0)
LYMPHS ABS: 2 10*3/uL (ref 0.7–4.0)
Lymphocytes Relative: 34.1 % (ref 12.0–46.0)
MCHC: 33.7 g/dL (ref 30.0–36.0)
MCV: 91.5 fl (ref 78.0–100.0)
MONO ABS: 0.4 10*3/uL (ref 0.1–1.0)
MONOS PCT: 6.2 % (ref 3.0–12.0)
NEUTROS ABS: 3.2 10*3/uL (ref 1.4–7.7)
NEUTROS PCT: 56.3 % (ref 43.0–77.0)
PLATELETS: 211 10*3/uL (ref 150.0–400.0)
RBC: 4.61 Mil/uL (ref 4.22–5.81)
RDW: 13.7 % (ref 11.5–15.5)
WBC: 5.7 10*3/uL (ref 4.0–10.5)

## 2016-08-18 LAB — LIPID PANEL
CHOLESTEROL: 149 mg/dL (ref 0–200)
HDL: 46.9 mg/dL (ref >39.00)
LDL Cholesterol: 89 mg/dL (ref 0–99)
NonHDL: 101.71
TRIGLYCERIDES: 63 mg/dL (ref 0.0–149.0)
Total CHOL/HDL Ratio: 3
VLDL: 12.6 mg/dL (ref 0.0–40.0)

## 2016-08-18 LAB — TSH: TSH: 1.17 u[IU]/mL (ref 0.35–4.50)

## 2016-08-19 ENCOUNTER — Encounter: Payer: Self-pay | Admitting: Internal Medicine

## 2016-09-21 ENCOUNTER — Other Ambulatory Visit: Payer: Self-pay

## 2016-09-21 ENCOUNTER — Telehealth: Payer: Self-pay | Admitting: Internal Medicine

## 2016-09-21 MED ORDER — HYDROCORTISONE 2.5 % RE CREA: TOPICAL_CREAM | Freq: Every day | RECTAL | 0 refills | Status: DC | PRN

## 2016-09-21 MED ORDER — ROSUVASTATIN CALCIUM 5 MG PO TABS: 5.0000 mg | ORAL_TABLET | Freq: Every day | ORAL | 3 refills | Status: DC

## 2016-09-21 NOTE — Telephone Encounter (Signed)
Refills sent over

## 2016-09-21 NOTE — Telephone Encounter (Signed)
Patient would like the following prescriptions refilled:   1) Crestor   2) Proctocone Hz 2.5%

## 2016-11-21 ENCOUNTER — Other Ambulatory Visit (INDEPENDENT_AMBULATORY_CARE_PROVIDER_SITE_OTHER): Payer: Self-pay | Admitting: Vascular Surgery

## 2016-11-21 DIAGNOSIS — I723 Aneurysm of iliac artery: Secondary | ICD-10-CM

## 2016-11-22 ENCOUNTER — Ambulatory Visit (INDEPENDENT_AMBULATORY_CARE_PROVIDER_SITE_OTHER): Payer: PPO | Admitting: Vascular Surgery

## 2016-11-22 ENCOUNTER — Encounter (INDEPENDENT_AMBULATORY_CARE_PROVIDER_SITE_OTHER): Payer: Self-pay | Admitting: Vascular Surgery

## 2016-11-22 ENCOUNTER — Ambulatory Visit (INDEPENDENT_AMBULATORY_CARE_PROVIDER_SITE_OTHER): Payer: PPO

## 2016-11-22 VITALS — BP 133/85 | HR 64 | Resp 16 | Wt 176.0 lb

## 2016-11-22 DIAGNOSIS — I723 Aneurysm of iliac artery: Secondary | ICD-10-CM

## 2016-11-22 DIAGNOSIS — E7800 Pure hypercholesterolemia, unspecified: Secondary | ICD-10-CM | POA: Diagnosis not present

## 2016-11-22 NOTE — Assessment & Plan Note (Signed)
lipid control important in reducing the progression of atherosclerotic disease. Continue statin therapy  

## 2016-11-22 NOTE — Assessment & Plan Note (Signed)
His aortoiliac duplex today shows mild increase in his distal right common iliac artery aneurysm now measuring 2.0 cm in maximal diameter (previously 1.8). 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 His aorta measures 2.8 cm in maximum diameter. His internal and external iliac arteries are somewhat ectatic but not grossly aneurysmal.  We discussed the pathophysiology and natural history of iliac artery aneurysms. These are currently well below the threshold size for prophylactic repair, but we will need to follow these on an annual basis. Blood pressure control was stressed and he does not smoke. He will contact my office with any problems in the interim.

## 2016-11-22 NOTE — Progress Notes (Signed)
MRN : [id number]  Kyle Meadows is a 71 y.o. (03/30/1946) male who presents with chief complaint of  Chief Complaint  Patient presents with  . ultrasound follow up  .  History of Present Illness: Patient returns today in follow up of iliac artery aneurysms. He is doing well without complaints today. He denies any aneurysm related symptoms.Specifically, the patient denies new back or abdominal pain, or signs of peripheral embolization His aortoiliac duplex today shows mild increase in his distal right common iliac artery aneurysm now measuring 2.0 cm in maximal diameter (previously 1.8). 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 His aorta measures 2.8 cm in maximum diameter. His internal and external iliac arteries are somewhat ectatic but not grossly aneurysmal.   Current Outpatient Prescriptions  Medication Sig Dispense Refill  . fexofenadine (ALLEGRA) 180 MG tablet Take 1 tablet (180 mg total) by mouth daily as needed for allergies or rhinitis. (Patient taking differently: Take 180 mg by mouth every morning. ) 90 tablet 3  . glucosamine-chondroitin 500-400 MG tablet Take 1 tablet by mouth 2 (two) times daily.    . hydrocortisone (PROCTOZONE-HC) 2.5 % rectal cream Place rectally daily as needed for hemorrhoids or itching. 30 g 0  . meloxicam (MOBIC) 15 MG tablet Take 15 mg by mouth 2 (two) times daily.     . Multiple Vitamins-Minerals (CENTRUM SILVER ADULT 50+ PO) Take 1 tablet by mouth daily.    . NONFORMULARY OR COMPOUNDED ITEM [facility]  Achilles Tendonitis Cream- Diclofenac 3%, Baclofen 2%, Bupivacaine 1%, Doxepin 5%, Gabapentin 6%, Ibuprofen 3%, Pentoxifylline 3% Apply 1-2 grams to affected area 3-4 times daily Qty. 120 gm 3 refills 1 each 2  . oxybutynin (DITROPAN) 5 MG tablet TK 1 T PO Q 8 H PRF URINARY FREQUENCY OR URGENCY  2  . rosuvastatin (CRESTOR) 5 MG tablet Take 1 tablet (5 mg total) by mouth daily. 30 tablet 3    No current facility-administered medications for this visit.     Past Medical History:  Diagnosis Date  . Arthritis   . Diverticulosis, sigmoid   . Environmental allergies   . Frequency of urination   . H/O sleep apnea    S/P  OSA surgery -- resolved  . Hemorrhoids   . History of BPH   . History of melanoma excision    back  . History of nephritis    age 63  . History of squamous cell carcinoma excision    right arm, leg, face  . History of urinary retention    secondary bph with obstruction  . Urethral stricture     Past Surgical History:  Procedure Laterality Date  . CYSTOSCOPY WITH RETROGRADE URETHROGRAM N/A 05/08/2014   Procedure: CYSTOSCOPY WITH RETROGRADE URETHROGRAM;  Surgeon: Alexis Frock, MD;  Location[facility];  Service: Urology;  Laterality: N/A;  . CYSTOSCOPY WITH URETHRAL DILATATION N/A 05/08/2014   Procedure: CYSTOSCOPY WITH URETHRAL DILATATION;  Surgeon: Alexis Frock, MD;  Location: [facility];  Service: Urology;  Laterality: N/A;  . KNEE ARTHROSCOPY W/ MENISCECTOMY Right x2   last one 2005  . LAPAROSCOPIC INGUINAL HERNIA REPAIR Right 2007 (approx)  . ORIF  RIGHT WRIST FX  2008   retained hardware  . ROBOT ASSISTED LAPAROSCOPIC RADICAL PROSTATECTOMY N/A 03/19/2014   Procedure: ROBOTIC ASSISTED LAPAROSCOPIC SIMPLE PROSTATECTOMY;  Surgeon: Alexis Frock, MD;  Location: [facility];  Service: Urology;  Laterality: N/A;  . TONSILLECTOMY  age 57  . UVULOPALATOPHARYNGOPLASTY  x5  last one 82    Social History Social History  Substance Use Topics  . Smoking status: Never Smoker  . Smokeless tobacco: Never Used  . Alcohol use 0.0 oz/week     Comment: rare    Family History Family History  Problem Relation Age of Onset  . Heart disease Father        CABG x 4  . Arthritis Father     Allergies  Allergen Reactions  . Hydrocodone Other (See Comments)    hyper  . Percocet [Oxycodone-Acetaminophen] Other (See Comments)     "keeps me wide awake"     REVIEW OF SYSTEMS (Negative unless checked)  Constitutional: [] Weight loss  [] Fever  [] Chills Cardiac: [] Chest pain   [] Chest pressure   [] Palpitations   [] Shortness of breath when laying flat   [] Shortness of breath at rest   [] Shortness of breath with exertion. Vascular:  [] Pain in legs with walking   [] Pain in legs at rest   [] Pain in legs when laying flat   [] Claudication   [] Pain in feet when walking  [] Pain in feet at rest  [] Pain in feet when laying flat   [] History of DVT   [] Phlebitis   [] Swelling in legs   [] Varicose veins   [] Non-healing ulcers Pulmonary:   [] Uses home oxygen   [] Productive cough   [] Hemoptysis   [] Wheeze  [] COPD   [] Asthma Neurologic:  [] Dizziness  [] Blackouts   [] Seizures   [] History of stroke   [] History of TIA  [] Aphasia   [] Temporary blindness   [] Dysphagia   [] Weakness or numbness in arms   [] Weakness or numbness in legs Musculoskeletal:  [x] Arthritis   [] Joint swelling   [] Joint pain   [] Low back pain Hematologic:  [] Easy bruising  [] Easy bleeding   [] Hypercoagulable state   [] Anemic   Gastrointestinal:  [] Blood in stool   [] Vomiting blood  [x] Gastroesophageal reflux/heartburn   [] Abdominal pain Genitourinary:  [] Chronic kidney disease   [x] Difficult urination  [x] Frequent urination  [] Burning with urination   [] Hematuria Skin:  [] Rashes   [] Ulcers   [] Wounds Psychological:  [] History of anxiety   []  History of major depression.  Physical Examination  BP 133/85   Pulse 64   Resp 16   Wt 176 lb (79.8 kg)   BMI 25.99 kg/m  Gen:  WD/WN, NAD. Appears younger than stated age Head: [place]/AT, No temporalis wasting. Ear/Nose/Throat: Hearing grossly intact, nares w/o erythema or drainage, trachea midline Eyes: Conjunctiva clear. Sclera non-icteric Neck: Supple.  No JVD.  Pulmonary:  Good air movement, no use of accessory muscles.  Cardiac: RRR, normal S1, S2 Vascular:  Vessel Right Left  Radial Palpable Palpable                                    Gastrointestinal: soft, non-tender/non-distended. No increased aortic impulse Musculoskeletal: M/S 5/5 throughout.  No deformity or atrophy. no edema. Neurologic: Sensation grossly intact in extremities.  Symmetrical.  Speech is fluent.  Psychiatric: Judgment intact, Mood & affect appropriate for pt's clinical situation. Dermatologic: No rashes or ulcers noted.  No cellulitis or open wounds.      Labs No results found for this or any previous visit (from the past 2160 hour(s)).  Radiology No results found.   Assessment/Plan  Hypercholesterolemia lipid control important in reducing the progression of atherosclerotic disease. Continue statin therapy   Iliac  artery aneurysm [facility]) His aortoiliac duplex today shows mild increase in his distal right common iliac artery aneurysm now measuring 2.0 cm in maximal diameter (previously 1.8). 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 His aorta measures 2.8 cm in maximum diameter. His internal and external iliac arteries are somewhat ectatic but not grossly aneurysmal.  We discussed the pathophysiology and natural history of iliac artery aneurysms. These are currently well below the threshold size for prophylactic repair, but we will need to follow these on an annual basis. Blood pressure control was stressed and he does not smoke. He will contact my office with any problems in the interim.    Leotis Pain, MD  11/22/2016 10:08 AM    This note was created with Dragon medical transcription system.  Any errors from dictation are purely unintentional

## 2016-12-14 ENCOUNTER — Ambulatory Visit: Payer: PPO | Admitting: Internal Medicine

## 2016-12-27 ENCOUNTER — Ambulatory Visit (INDEPENDENT_AMBULATORY_CARE_PROVIDER_SITE_OTHER): Payer: PPO

## 2016-12-27 ENCOUNTER — Encounter: Payer: Self-pay | Admitting: Internal Medicine

## 2016-12-27 ENCOUNTER — Ambulatory Visit (INDEPENDENT_AMBULATORY_CARE_PROVIDER_SITE_OTHER): Payer: PPO | Admitting: Internal Medicine

## 2016-12-27 VITALS — BP 120/60 | HR 63 | Temp 98.3°F | Resp 12 | Ht 69.0 in | Wt 176.0 lb

## 2016-12-27 VITALS — BP 120/62 | HR 63 | Temp 98.3°F | Resp 12 | Ht 69.0 in | Wt 176.0 lb

## 2016-12-27 DIAGNOSIS — Z0000 Encounter for general adult medical examination without abnormal findings: Secondary | ICD-10-CM | POA: Diagnosis not present

## 2016-12-27 DIAGNOSIS — I723 Aneurysm of iliac artery: Secondary | ICD-10-CM | POA: Diagnosis not present

## 2016-12-27 DIAGNOSIS — N138 Other obstructive and reflux uropathy: Secondary | ICD-10-CM | POA: Diagnosis not present

## 2016-12-27 DIAGNOSIS — R109 Unspecified abdominal pain: Secondary | ICD-10-CM

## 2016-12-27 DIAGNOSIS — Z23 Encounter for immunization: Secondary | ICD-10-CM

## 2016-12-27 DIAGNOSIS — Z1211 Encounter for screening for malignant neoplasm of colon: Secondary | ICD-10-CM | POA: Diagnosis not present

## 2016-12-27 DIAGNOSIS — N401 Enlarged prostate with lower urinary tract symptoms: Secondary | ICD-10-CM

## 2016-12-27 DIAGNOSIS — Z1159 Encounter for screening for other viral diseases: Secondary | ICD-10-CM

## 2016-12-27 DIAGNOSIS — E7800 Pure hypercholesterolemia, unspecified: Secondary | ICD-10-CM | POA: Diagnosis not present

## 2016-12-27 NOTE — Progress Notes (Signed)
Subjective:   Kyle Meadows is a 71 y.o. male who presents for an Initial Medicare Annual Wellness Visit.  Review of Systems  No ROS.  Medicare Wellness Visit. Additional risk factors are reflected in the social history.  Cardiac Risk Factors include: male gender;advanced age (>23men, >24 women)    Objective:    Today's Vitals   12/27/16 1259  BP: 120/62  Pulse: 63  Resp: 12  Temp: 98.3 F (36.8 C)  TempSrc: Oral  SpO2: 98%  Weight: 176 lb (79.8 kg)  Height: 5\' 9"  (1.753 m)   Body mass index is 25.99 kg/m.  Current Medications (verified) Outpatient Encounter Prescriptions as of 12/27/2016  Medication Sig  . fexofenadine (ALLEGRA) 180 MG tablet Take 1 tablet (180 mg total) by mouth daily as needed for allergies or rhinitis. (Patient taking differently: Take 180 mg by mouth every morning. )  . glucosamine-chondroitin 500-400 MG tablet Take 1 tablet by mouth 2 (two) times daily.  . hydrocortisone (PROCTOZONE-HC) 2.5 % rectal cream Place rectally daily as needed for hemorrhoids or itching.  . meloxicam (MOBIC) 15 MG tablet Take 15 mg by mouth 2 (two) times daily.   . Multiple Vitamins-Minerals (CENTRUM SILVER ADULT 50+ PO) Take 1 tablet by mouth daily.  Marland Kitchen oxybutynin (DITROPAN) 5 MG tablet TK 1 T PO Q 8 H PRF URINARY FREQUENCY OR URGENCY  . rosuvastatin (CRESTOR) 5 MG tablet Take 1 tablet (5 mg total) by mouth daily.  . [DISCONTINUED] NONFORMULARY OR COMPOUNDED ITEM [facility]  Achilles Tendonitis Cream- Diclofenac 3%, Baclofen 2%, Bupivacaine 1%, Doxepin 5%, Gabapentin 6%, Ibuprofen 3%, Pentoxifylline 3% Apply 1-2 grams to affected area 3-4 times daily Qty. 120 gm 3 refills   No facility-administered encounter medications on file as of 12/27/2016.     Allergies (verified) Hydrocodone and Percocet [oxycodone-acetaminophen]   History: Past Medical History:  Diagnosis Date  . Arthritis   . Diverticulosis, sigmoid   . Environmental allergies   . Frequency of  urination   . H/O sleep apnea    S/P  OSA surgery -- resolved  . Hemorrhoids   . History of BPH   . History of melanoma excision    back  . History of nephritis    age 58  . History of squamous cell carcinoma excision    right arm, leg, face  . History of urinary retention    secondary bph with obstruction  . Urethral stricture    Past Surgical History:  Procedure Laterality Date  . CYSTOSCOPY WITH RETROGRADE URETHROGRAM N/A 05/08/2014   Procedure: CYSTOSCOPY WITH RETROGRADE URETHROGRAM;  Surgeon: Alexis Frock, MD;  Location: [facility];  Service: Urology;  Laterality: N/A;  . CYSTOSCOPY WITH URETHRAL DILATATION N/A 05/08/2014   Procedure: CYSTOSCOPY WITH URETHRAL DILATATION;  Surgeon: Alexis Frock, MD;  Location: [facility];  Service: Urology;  Laterality: N/A;  . KNEE ARTHROSCOPY W/ MENISCECTOMY Right x2   last one 2005  . LAPAROSCOPIC INGUINAL HERNIA REPAIR Right 2007 (approx)  . ORIF  RIGHT WRIST FX  2008   retained hardware  . ROBOT ASSISTED LAPAROSCOPIC RADICAL PROSTATECTOMY N/A 03/19/2014   Procedure: ROBOTIC ASSISTED LAPAROSCOPIC SIMPLE PROSTATECTOMY;  Surgeon: Alexis Frock, MD;  Location: [facility];  Service: Urology;  Laterality: N/A;  . TONSILLECTOMY  age 27  . UVULOPALATOPHARYNGOPLASTY  x5  last one 1995   Family History  Problem Relation Age of Onset  . Heart disease Father        CABG x 4  .  Arthritis Father   . Glaucoma Father   . Hepatitis C Mother   . Thyroid disease Sister    Social History   Occupational History  . Not on file.   Social History Main Topics  . Smoking status: Never Smoker  . Smokeless tobacco: Never Used  . Alcohol use 0.0 oz/week     Comment: rare  . Drug use: No  . Sexual activity: Not on file   Tobacco Counseling Counseling given: Not Answered   Activities of Daily Living In your present state of health, do you have any difficulty performing the following activities: 12/27/2016  Hearing? N    Vision? N  Difficulty concentrating or making decisions? N  Walking or climbing stairs? N  Dressing or bathing? N  Doing errands, shopping? N  Preparing Food and eating ? N  Using the Toilet? N  In the past six months, have you accidently leaked urine? N  Do you have problems with loss of bowel control? N  Managing your Medications? N  Managing your Finances? N  Housekeeping or managing your Housekeeping? N  Some recent data might be hidden    Immunizations and Health Maintenance Immunization History  Administered Date(s) Administered  . Influenza Whole 01/28/2009, 02/09/2010, 01/24/2016  . Influenza, High Dose Seasonal PF 12/27/2016  . Influenza,inj,Quad PF,6+ Mos 01/28/2014  . Pneumococcal Conjugate-13 06/09/2016  . Pneumococcal Polysaccharide-23 04/16/2012  . Tdap 01/01/2004, 02/02/2016   Health Maintenance Due  Topic Date Due  . Hepatitis C Screening  Aug 07, 1945  . COLONOSCOPY  12/12/1995  . INFLUENZA VACCINE  11/23/2016    Patient Care Team: Einar Pheasant, MD as PCP - General (Internal Medicine)  Indicate any recent Medical Services you may have received from other than [facility] providers in the past year (date may be approximate).    Assessment:   This is a routine wellness examination for [name]. The goal of the wellness visit is to assist the patient how to close the gaps in care and create a preventative care plan for the patient.   The roster of all physicians providing medical care to patient is listed in the Snapshot section of the chart.  Taking calcium VIT D as appropriate/Osteoporosis risk reviewed.    Safety issues reviewed; Smoke and carbon monoxide detectors in the home. No firearms in the home.  Wears seatbelts when driving or riding with others. Patient does wear sunscreen or protective clothing when in direct sunlight. No violence in the home.  Depression- PHQ 2 &9 complete.  No signs/symptoms or verbal communication regarding little pleasure in  doing things, feeling down, depressed or hopeless. No changes in sleeping, energy, eating, concentrating.  No thoughts of self harm or harm towards others.  Time spent on this topic is 9 minutes.   Patient is alert, normal appearance, oriented to person/place/and time.  Correctly identified the president of the [place], recall of 3/3 words, and performing simple calculations. Displays appropriate judgement and can read correct time from watch face.   No new identified risk were noted.  No failures at ADL's or IADL's.    BMI- discussed the importance of a healthy diet, water intake and the benefits of aerobic exercise. Educational material provided.   24 hour diet recall: Breakfast: cereal, fruit Lunch: power bar, fruit Dinner: grilled chicken   Daily fluid intake: 0 cups of caffeine, 6-8 cups of water  Dental- every 6 months.    Eye- Visual acuity not assessed per patient preference since they have regular follow up  with the ophthalmologist.  Wears corrective lenses.  Sleep patterns- Sleeps 6-7 hours at night.  Wakes feeling rested.    High dose influenza administered R deltoid, tolerated well. Educational material provided.  Colonoscopy discussed, follow up with PCP.  Hepatitis C Screening discussed, future lab ordered.  Educational information provided.  Patient Concerns: ROI to [facility]; CMA aware.  Continued Lower ABD pain; deferred to PCP for follow up.   Hearing/Vision screen Hearing Screening Comments: Patient is able to hear conversational tones without difficulty.  No issues reported.   Vision Screening Comments: Followed by [facility]) Wears corrective lenses Visual acuity not assessed per patient preference since they have regular follow up with the ophthalmologist  Dietary issues and exercise activities discussed: Current Exercise Habits: Home exercise routine, Type of exercise: walking;calisthenics, Intensity: Mild  Goals    . Increase  physical activity          Stay active with exercises at the gym      Depression Screen PHQ 2/9 Scores 12/27/2016 06/09/2016 10/09/2012  PHQ - 2 Score 0 0 0    Fall Risk Fall Risk  12/27/2016 06/09/2016 10/09/2012  Falls in the past year? No No No    Cognitive Function: MMSE - Mini Mental State Exam 12/27/2016  Orientation to time 5  Orientation to Place 5  Registration 3  Attention/ Calculation 5  Recall 3  Language- name 2 objects 2  Language- repeat 1  Language- follow 3 step command 3  Language- read & follow direction 1  Write a sentence 1  Copy design 1  Total score 30        Screening Tests Health Maintenance  Topic Date Due  . Hepatitis C Screening  [birth year]  . COLONOSCOPY  12/12/1995  . INFLUENZA VACCINE  11/23/2016  . TETANUS/TDAP  02/01/2026  . PNA vac Low Risk Adult  Completed        Plan:    End of life planning; Advance aging; Advanced directives discussed. Copy of current HCPOA/Living Will on file.    I have personally reviewed and noted the following in the patient's chart:   . Medical and social history . Use of alcohol, tobacco or illicit drugs  . Current medications and supplements . Functional ability and status . Nutritional status . Physical activity . Advanced directives . List of other physicians . Hospitalizations, surgeries, and ER visits in previous 12 months . Vitals . Screenings to include cognitive, depression, and falls . Referrals and appointments  In addition, I have reviewed and discussed with patient certain preventive protocols, quality metrics, and best practice recommendations. A written personalized care plan for preventive services as well as general preventive health recommendations were provided to patient.     Varney Biles, LPN   0/11/1446    Reviewed above information.  Agree with assessment and plan.  See my note for f/u regarding abdominal pain.   Dr Nicki Reaper

## 2016-12-27 NOTE — Patient Instructions (Addendum)
  Mr. Mckesson , Thank you for taking time to come for your Medicare Wellness Visit. I appreciate your ongoing commitment to your health goals. Please review the following plan we discussed and let me know if I can assist you in the future.   Follow up with Dr. Nicki Reaper as needed.    Have a great day!  These are the goals we discussed: Goals    . Increase physical activity          Stay active with exercises at the gym       This is a list of the screening recommended for you and due dates:  Health Maintenance  Topic Date Due  .  Hepatitis C: One time screening is recommended by Center for Disease Control  (CDC) for  adults born from [birth year] through 1965.   [birth year]  . Colon Cancer Screening  12/12/1995  . Flu Shot  11/23/2016  . Tetanus Vaccine  02/01/2026  . Pneumonia vaccines  Completed

## 2016-12-27 NOTE — Progress Notes (Signed)
[name], male   DOB: Dec 01, 1945, 71 y.o.   MRN: [id number]   Subjective:    [name], male    DOB: 1946/04/04, 71 y.o.   MRN: [id number]  HPI  Patient here for a scheduled follow up.  Has retired.  Enjoying.  Feels good.  Staying active.  No chest pain.  No sob.  No acid reflux.  Still with some lower abdominal discomfort.  Has been present for years.  Had previous CT.  Unrevealing.  Worse with heavy lifting.  Sees urology.  See previous notes.  Has f/u planned with urology - soon.  Just saw Dr Lucky Cowboy for f/u iliac artery aneurysm. Felt stable.  Recommended f/u in one year.  Has osteoarthritis of knees seeing ortho.     Past Medical History:  Diagnosis Date  . Arthritis   . Diverticulosis, sigmoid   . Environmental allergies   . Frequency of urination   . H/O sleep apnea    S/P  OSA surgery -- resolved  . Hemorrhoids   . History of BPH   . History of melanoma excision    back  . History of nephritis    age 36  . History of squamous cell carcinoma excision    right arm, leg, face  . History of urinary retention    secondary bph with obstruction  . Urethral stricture    Past Surgical History:  Procedure Laterality Date  . CYSTOSCOPY WITH RETROGRADE URETHROGRAM N/A 05/08/2014   Procedure: CYSTOSCOPY WITH RETROGRADE URETHROGRAM;  Surgeon: Alexis Frock, MD;  Location: [facility];  Service: Urology;  Laterality: N/A;  . CYSTOSCOPY WITH URETHRAL DILATATION N/A 05/08/2014   Procedure: CYSTOSCOPY WITH URETHRAL DILATATION;  Surgeon: Alexis Frock, MD;  Location: [facility];  Service: Urology;  Laterality: N/A;  . KNEE ARTHROSCOPY W/ MENISCECTOMY Right x2   last one 2005  . LAPAROSCOPIC INGUINAL HERNIA REPAIR Right 2007 (approx)  . ORIF  RIGHT WRIST FX  2008   retained hardware  . ROBOT ASSISTED LAPAROSCOPIC RADICAL PROSTATECTOMY N/A 03/19/2014   Procedure: ROBOTIC ASSISTED LAPAROSCOPIC SIMPLE PROSTATECTOMY;  Surgeon:  Alexis Frock, MD;  Location: [facility];  Service: Urology;  Laterality: N/A;  . TONSILLECTOMY  age 44  . UVULOPALATOPHARYNGOPLASTY  x5  last one 1995   Family History  Problem Relation Age of Onset  . Heart disease Father        CABG x 4  . Arthritis Father   . Glaucoma Father   . Hepatitis C Mother   . Thyroid disease Sister    Social History   Social History  . Marital status: Married    Spouse name: N/A  . Number of children: N/A  . Years of education: N/A   Social History Main Topics  . Smoking status: Never Smoker  . Smokeless tobacco: Never Used  . Alcohol use 0.0 oz/week     Comment: rare  . Drug use: No  . Sexual activity: Not Asked   Other Topics Concern  . None   Social History Narrative  . None    Outpatient Encounter Prescriptions as of 12/27/2016  Medication Sig  . fexofenadine (ALLEGRA) 180 MG tablet Take 1 tablet (180 mg total) by mouth daily as needed for allergies or rhinitis. (Patient taking differently: Take 180 mg by mouth every morning. )  . glucosamine-chondroitin 500-400 MG tablet Take 1 tablet by mouth 2 (two) times daily.  . hydrocortisone (PROCTOZONE-HC) 2.5 % rectal  cream Place rectally daily as needed for hemorrhoids or itching.  . meloxicam (MOBIC) 15 MG tablet Take 15 mg by mouth 2 (two) times daily.   . Multiple Vitamins-Minerals (CENTRUM SILVER ADULT 50+ PO) Take 1 tablet by mouth daily.  Marland Kitchen oxybutynin (DITROPAN) 5 MG tablet TK 1 T PO Q 8 H PRF URINARY FREQUENCY OR URGENCY  . rosuvastatin (CRESTOR) 5 MG tablet Take 1 tablet (5 mg total) by mouth daily.   No facility-administered encounter medications on file as of 12/27/2016.     Review of Systems  Constitutional: Negative for appetite change and unexpected weight change.  HENT: Negative for congestion and sinus pressure.   Respiratory: Negative for cough, chest tightness and shortness of breath.   Cardiovascular: Negative for chest pain, palpitations and leg swelling.  Gastrointestinal:  Positive for abdominal pain. Negative for diarrhea, nausea and vomiting.  Genitourinary: Negative for difficulty urinating and dysuria.  Musculoskeletal: Negative for joint swelling and myalgias.  Skin: Negative for color change and rash.  Neurological: Negative for dizziness, light-headedness and headaches.  Psychiatric/Behavioral: Negative for agitation and dysphoric mood.       Objective:     Blood pressure rechecked by me:  122/78  Physical Exam  Constitutional: He appears well-developed and well-nourished. No distress.  HENT:  Nose: Nose normal.  Mouth/Throat: Oropharynx is clear and moist.  Neck: Neck supple. No thyromegaly present.  Cardiovascular: Normal rate and regular rhythm.   Pulmonary/Chest: Effort normal and breath sounds normal. No respiratory distress.  Abdominal: Soft. Bowel sounds are normal. There is no tenderness.  Musculoskeletal: He exhibits no edema or tenderness.  Lymphadenopathy:    He has no cervical adenopathy.  Skin: No rash noted. No erythema.  Psychiatric: He has a normal mood and affect. His behavior is normal.    BP 120/60 (BP Location: Left Arm, Patient Position: Sitting, Cuff Size: Normal)   Pulse 63   Temp 98.3 F (36.8 C) (Oral)   Resp 12   Ht 5\' 9"  (1.753 m)   Wt 176 lb (79.8 kg)   SpO2 98%   BMI 25.99 kg/m  Wt Readings from Last 3 Encounters:  12/27/16 176 lb (79.8 kg)  12/27/16 176 lb (79.8 kg)  11/22/16 176 lb (79.8 kg)     Lab Results  Component Value Date   WBC 5.7 08/18/2016   HGB 14.2 08/18/2016   HCT 42.2 08/18/2016   PLT 211.0 08/18/2016   GLUCOSE 94 08/18/2016   CHOL 149 08/18/2016   TRIG 63.0 08/18/2016   HDL 46.90 08/18/2016   LDLCALC 89 08/18/2016   ALT 15 08/18/2016   AST 19 08/18/2016   NA 142 08/18/2016   K 4.1 08/18/2016   CL 106 08/18/2016   CREATININE 1.07 08/18/2016   BUN 27 (H) 08/18/2016   CO2 32 08/18/2016   TSH 1.17 08/18/2016       Assessment & Plan:   Problem List Items Addressed  This Visit    Abdominal pain    Persistent lower abdominal pain as outlined.  Has been present for years.  Had CT previously.  Unrevealing.  Has seen urology.  Has f/u soon with urology.  Discussed f/u scan.  He declines.  Pain is aggravated by heavy lifting.  Will follow.  He plans to discuss with urology.        Hypercholesterolemia    On crestor.  Last cholesterol panel improved.  Low cholesterol diet and exercise.  Follow lipid panel and liver function tests.  Relevant Orders   Lipid panel   Basic metabolic panel   Hepatic function panel   Iliac artery aneurysm ([place])    Followed by Dr Lucky Cowboy.  Just evaluated.  Recommended f/u in one year.        Prostatic hyperplasia, benign localized, with obstruction    S/p simple prostatectomy.  Followed by urology.        Other Visit Diagnoses    Colon cancer screening    -  Primary   Relevant Orders   Ambulatory referral to Gastroenterology   Need for hepatitis C screening test           Einar Pheasant, MD

## 2016-12-28 ENCOUNTER — Encounter: Payer: Self-pay | Admitting: Internal Medicine

## 2016-12-28 LAB — HEPATITIS C ANTIBODY: HCV AB: NONREACTIVE

## 2016-12-30 ENCOUNTER — Encounter: Payer: Self-pay | Admitting: Internal Medicine

## 2016-12-30 DIAGNOSIS — R109 Unspecified abdominal pain: Secondary | ICD-10-CM | POA: Insufficient documentation

## 2016-12-30 NOTE — Assessment & Plan Note (Signed)
S/p simple prostatectomy.  Followed by urology.

## 2016-12-30 NOTE — Telephone Encounter (Signed)
Hard copy mailed  

## 2016-12-30 NOTE — Assessment & Plan Note (Signed)
On crestor.  Last cholesterol panel improved.  Low cholesterol diet and exercise.  Follow lipid panel and liver function tests.

## 2016-12-30 NOTE — Assessment & Plan Note (Signed)
Persistent lower abdominal pain as outlined.  Has been present for years.  Had CT previously.  Unrevealing.  Has seen urology.  Has f/u soon with urology.  Discussed f/u scan.  He declines.  Pain is aggravated by heavy lifting.  Will follow.  He plans to discuss with urology.

## 2016-12-30 NOTE — Assessment & Plan Note (Signed)
Followed by Dr Lucky Cowboy.  Just evaluated.  Recommended f/u in one year.

## 2017-01-20 DIAGNOSIS — R3915 Urgency of urination: Secondary | ICD-10-CM | POA: Diagnosis not present

## 2017-01-20 DIAGNOSIS — N359 Urethral stricture, unspecified: Secondary | ICD-10-CM | POA: Diagnosis not present

## 2017-01-20 DIAGNOSIS — N401 Enlarged prostate with lower urinary tract symptoms: Secondary | ICD-10-CM | POA: Diagnosis not present

## 2017-01-31 ENCOUNTER — Telehealth: Payer: Self-pay | Admitting: Internal Medicine

## 2017-01-31 MED ORDER — ROSUVASTATIN CALCIUM 5 MG PO TABS: 5.0000 mg | ORAL_TABLET | Freq: Every day | ORAL | 3 refills | Status: DC

## 2017-01-31 NOTE — Telephone Encounter (Signed)
Pt would like to have the following medication refilled. He wants more than a 30 day refill so he doesn't have to keep coming to office asking for refills. Pt was very rude about this matter.

## 2017-02-02 ENCOUNTER — Other Ambulatory Visit: Payer: PPO

## 2017-02-07 ENCOUNTER — Telehealth: Payer: Self-pay | Admitting: Internal Medicine

## 2017-02-07 ENCOUNTER — Other Ambulatory Visit (INDEPENDENT_AMBULATORY_CARE_PROVIDER_SITE_OTHER): Payer: PPO

## 2017-02-07 DIAGNOSIS — E7800 Pure hypercholesterolemia, unspecified: Secondary | ICD-10-CM | POA: Diagnosis not present

## 2017-02-07 LAB — LIPID PANEL
CHOL/HDL RATIO: 3
CHOLESTEROL: 163 mg/dL (ref 0–200)
HDL: 49.6 mg/dL (ref >39.00)
LDL CALC: 102 mg/dL — AB (ref 0–99)
NonHDL: 113.82
TRIGLYCERIDES: 61 mg/dL (ref 0.0–149.0)
VLDL: 12.2 mg/dL (ref 0.0–40.0)

## 2017-02-07 LAB — BASIC METABOLIC PANEL
BUN: 26 mg/dL — AB (ref 6–23)
CO2: 28 mEq/L (ref 19–32)
CREATININE: 1.06 mg/dL (ref 0.40–1.50)
Calcium: 9.4 mg/dL (ref 8.4–10.5)
Chloride: 106 mEq/L (ref 96–112)
GFR: 73.17 mL/min (ref >60.00)
GLUCOSE: 93 mg/dL (ref 70–99)
Potassium: 4 mEq/L (ref 3.5–5.1)
Sodium: 142 mEq/L (ref 135–145)

## 2017-02-07 LAB — HEPATIC FUNCTION PANEL
ALBUMIN: 4.4 g/dL (ref 3.5–5.2)
ALT: 15 U/L (ref 0–53)
AST: 19 U/L (ref 0–37)
Alkaline Phosphatase: 56 U/L (ref 39–117)
BILIRUBIN TOTAL: 0.5 mg/dL (ref 0.2–1.2)
Bilirubin, Direct: 0.1 mg/dL (ref 0.0–0.3)
Total Protein: 6.9 g/dL (ref 6.0–8.3)

## 2017-02-07 MED ORDER — HYDROCORTISONE 2.5 % RE CREA: TOPICAL_CREAM | Freq: Every day | RECTAL | 1 refills | Status: DC | PRN

## 2017-02-07 NOTE — Telephone Encounter (Signed)
Need to know if pt is having persistent problems or is this just something he uses prn.  If persistent problems, needs to be evaluated.

## 2017-02-07 NOTE — Telephone Encounter (Signed)
rx sent in.  Aware needs evaluation if persistent problems.

## 2017-02-07 NOTE — Telephone Encounter (Signed)
Called patient he states that he uses everyday and you know that and he does not know why I am even bothering asking him that. Informed him that I would pass message on to you.

## 2017-02-07 NOTE — Telephone Encounter (Signed)
Pt states he needs a refill for medication of hydrocortisone (PROCTOZONE-HC) 2.5 % rectal cream  Pharmacy is [facility], [place]  Call pt @ [phone]. Thank you!

## 2017-02-07 NOTE — Telephone Encounter (Signed)
Last O/V- 12/27/16 Next O/V- 06/28/17 Last refill- 09/21/16

## 2017-03-31 DIAGNOSIS — G8929 Other chronic pain: Secondary | ICD-10-CM | POA: Diagnosis not present

## 2017-03-31 DIAGNOSIS — M7918 Myalgia, other site: Secondary | ICD-10-CM | POA: Diagnosis not present

## 2017-03-31 DIAGNOSIS — Z1211 Encounter for screening for malignant neoplasm of colon: Secondary | ICD-10-CM | POA: Diagnosis not present

## 2017-04-29 DIAGNOSIS — J019 Acute sinusitis, unspecified: Secondary | ICD-10-CM | POA: Diagnosis not present

## 2017-05-02 DIAGNOSIS — D225 Melanocytic nevi of trunk: Secondary | ICD-10-CM | POA: Diagnosis not present

## 2017-05-02 DIAGNOSIS — Z85820 Personal history of malignant melanoma of skin: Secondary | ICD-10-CM | POA: Diagnosis not present

## 2017-05-02 DIAGNOSIS — D2261 Melanocytic nevi of right upper limb, including shoulder: Secondary | ICD-10-CM | POA: Diagnosis not present

## 2017-05-02 DIAGNOSIS — X32XXXA Exposure to sunlight, initial encounter: Secondary | ICD-10-CM | POA: Diagnosis not present

## 2017-05-02 DIAGNOSIS — D485 Neoplasm of uncertain behavior of skin: Secondary | ICD-10-CM | POA: Diagnosis not present

## 2017-05-02 DIAGNOSIS — Z85828 Personal history of other malignant neoplasm of skin: Secondary | ICD-10-CM | POA: Diagnosis not present

## 2017-05-02 DIAGNOSIS — L538 Other specified erythematous conditions: Secondary | ICD-10-CM | POA: Diagnosis not present

## 2017-05-02 DIAGNOSIS — L820 Inflamed seborrheic keratosis: Secondary | ICD-10-CM | POA: Diagnosis not present

## 2017-05-02 DIAGNOSIS — L570 Actinic keratosis: Secondary | ICD-10-CM | POA: Diagnosis not present

## 2017-06-08 DIAGNOSIS — M170 Bilateral primary osteoarthritis of knee: Secondary | ICD-10-CM | POA: Diagnosis not present

## 2017-06-12 ENCOUNTER — Encounter: Payer: Self-pay | Admitting: *Deleted

## 2017-06-13 ENCOUNTER — Ambulatory Visit: Payer: PPO | Admitting: Anesthesiology

## 2017-06-13 ENCOUNTER — Encounter
Admission: RE | Disposition: A | Discharge status: Home / Self Care | Payer: Self-pay | Source: Ambulatory Visit | Attending: Gastroenterology

## 2017-06-13 ENCOUNTER — Ambulatory Visit
Admission: RE | Admit: 2017-06-13 | Discharge: 2017-06-13 | Disposition: A | Discharge status: Home / Self Care | Payer: PPO | Source: Ambulatory Visit | Attending: Gastroenterology | Admitting: Gastroenterology

## 2017-06-13 ENCOUNTER — Encounter: Payer: Self-pay | Admitting: Certified Registered Nurse Anesthetist

## 2017-06-13 DIAGNOSIS — K621 Rectal polyp: Secondary | ICD-10-CM | POA: Insufficient documentation

## 2017-06-13 DIAGNOSIS — D125 Benign neoplasm of sigmoid colon: Secondary | ICD-10-CM | POA: Diagnosis not present

## 2017-06-13 DIAGNOSIS — Z85820 Personal history of malignant melanoma of skin: Secondary | ICD-10-CM | POA: Diagnosis not present

## 2017-06-13 DIAGNOSIS — Z1211 Encounter for screening for malignant neoplasm of colon: Secondary | ICD-10-CM | POA: Diagnosis not present

## 2017-06-13 DIAGNOSIS — K5730 Diverticulosis of large intestine without perforation or abscess without bleeding: Secondary | ICD-10-CM | POA: Diagnosis not present

## 2017-06-13 DIAGNOSIS — E7800 Pure hypercholesterolemia, unspecified: Secondary | ICD-10-CM | POA: Diagnosis not present

## 2017-06-13 DIAGNOSIS — Z885 Allergy status to narcotic agent status: Secondary | ICD-10-CM | POA: Insufficient documentation

## 2017-06-13 DIAGNOSIS — Z79899 Other long term (current) drug therapy: Secondary | ICD-10-CM | POA: Insufficient documentation

## 2017-06-13 DIAGNOSIS — N400 Enlarged prostate without lower urinary tract symptoms: Secondary | ICD-10-CM | POA: Diagnosis not present

## 2017-06-13 DIAGNOSIS — K635 Polyp of colon: Secondary | ICD-10-CM | POA: Diagnosis not present

## 2017-06-13 DIAGNOSIS — I739 Peripheral vascular disease, unspecified: Secondary | ICD-10-CM | POA: Diagnosis not present

## 2017-06-13 DIAGNOSIS — G4730 Sleep apnea, unspecified: Secondary | ICD-10-CM | POA: Insufficient documentation

## 2017-06-13 DIAGNOSIS — M1990 Unspecified osteoarthritis, unspecified site: Secondary | ICD-10-CM | POA: Diagnosis not present

## 2017-06-13 DIAGNOSIS — K648 Other hemorrhoids: Secondary | ICD-10-CM | POA: Diagnosis not present

## 2017-06-13 DIAGNOSIS — K5790 Diverticulosis of intestine, part unspecified, without perforation or abscess without bleeding: Secondary | ICD-10-CM | POA: Diagnosis not present

## 2017-06-13 HISTORY — DX: Malignant melanoma of skin, unspecified: C43.9

## 2017-06-13 HISTORY — PX: COLONOSCOPY WITH PROPOFOL: SHX5780

## 2017-06-13 LAB — HM COLONOSCOPY

## 2017-06-13 SURGERY — COLONOSCOPY WITH PROPOFOL
Anesthesia: General

## 2017-06-13 MED ORDER — PROPOFOL 10 MG/ML IV BOLUS
INTRAVENOUS | Status: AC
  Filled 2017-06-13: qty 20

## 2017-06-13 MED ORDER — SODIUM CHLORIDE 0.9 % IV SOLN
INTRAVENOUS | Status: DC
  Administered 2017-06-13: 1000 mL via INTRAVENOUS
  Administered 2017-06-13: 13:00:00 via INTRAVENOUS

## 2017-06-13 MED ORDER — PROPOFOL 500 MG/50ML IV EMUL
INTRAVENOUS | Status: DC | PRN
  Administered 2017-06-13: 100 ug/kg/min via INTRAVENOUS

## 2017-06-13 MED ORDER — SODIUM CHLORIDE 0.9 % IV SOLN: INTRAVENOUS | Status: DC

## 2017-06-13 MED ORDER — LIDOCAINE HCL (PF) 2 % IJ SOLN
INTRAMUSCULAR | Status: AC
  Filled 2017-06-13: qty 10

## 2017-06-13 MED ORDER — PROPOFOL 10 MG/ML IV BOLUS
INTRAVENOUS | Status: DC | PRN
  Administered 2017-06-13: 70 mg via INTRAVENOUS

## 2017-06-13 MED ORDER — PROPOFOL 10 MG/ML IV BOLUS
INTRAVENOUS | Status: AC
  Filled 2017-06-13: qty 40

## 2017-06-13 NOTE — Anesthesia Preprocedure Evaluation (Signed)
Anesthesia Evaluation  Patient identified by MRN, date of birth, ID band Patient awake    Reviewed: Allergy & Precautions, H&P , NPO status , Patient's Chart, lab work & pertinent test results, reviewed documented beta blocker date and time   Airway Mallampati: II   Neck ROM: full    Dental  (+) Poor Dentition   Pulmonary neg pulmonary ROS,    Pulmonary exam normal        Cardiovascular Exercise Tolerance: Poor + Peripheral Vascular Disease  negative cardio ROS Normal cardiovascular exam Rhythm:regular Rate:Normal     Neuro/Psych  Neuromuscular disease negative neurological ROS  negative psych ROS   GI/Hepatic negative GI ROS, Neg liver ROS,   Endo/Other  negative endocrine ROS  Renal/GU negative Renal ROS  negative genitourinary   Musculoskeletal   Abdominal   Peds  Hematology negative hematology ROS (+)   Anesthesia Other Findings Past Medical History: No date: Arthritis No date: Diverticulosis, sigmoid No date: Environmental allergies No date: Frequency of urination No date: H/O sleep apnea     Comment:  S/P  OSA surgery -- resolved No date: Hemorrhoids No date: History of BPH No date: History of melanoma excision     Comment:  back No date: History of nephritis     Comment:  age 72 No date: History of squamous cell carcinoma excision     Comment:  right arm, leg, face No date: History of urinary retention     Comment:  secondary bph with obstruction No date: Melanoma (H[facility]) No date: Urethral stricture Past Surgical History: 05/08/2014: CYSTOSCOPY WITH RETROGRADE URETHROGRAM; N/A     Comment:  Procedure: CYSTOSCOPY WITH RETROGRADE URETHROGRAM;                Surgeon: Alexis Frock, MD;  Location: [facility];  Service: Urology;  Laterality: N/A; 05/08/2014: CYSTOSCOPY WITH URETHRAL DILATATION; N/A     Comment:  Procedure: CYSTOSCOPY WITH URETHRAL DILATATION;      Surgeon: Alexis Frock, MD;  Location: [facility];  Service: Urology;  Laterality: N/A; x2   last one 2005: KNEE ARTHROSCOPY W/ MENISCECTOMY; Right 2007 (approx): LAPAROSCOPIC INGUINAL HERNIA REPAIR; Right 2008: ORIF  RIGHT WRIST FX     Comment:  retained hardware 03/19/2014: ROBOT ASSISTED LAPAROSCOPIC RADICAL PROSTATECTOMY; N/A     Comment:  Procedure: ROBOTIC ASSISTED LAPAROSCOPIC SIMPLE               PROSTATECTOMY;  Surgeon: Alexis Frock, MD;  Location:               [facility];  Service: Urology;  Laterality: N/A; age 72: TONSILLECTOMY x5  last one 1995: UVULOPALATOPHARYNGOPLASTY BMI    Body Mass Index:  25.55 kg/m     Reproductive/Obstetrics negative OB ROS                             Anesthesia Physical Anesthesia Plan  ASA: III  Anesthesia Plan: General   Post-op Pain Management:    Induction:   PONV Risk Score and Plan:   Airway Management Planned:   Additional Equipment:   Intra-op Plan:   Post-operative Plan:   Informed Consent: I have reviewed the patients History and Physical, chart, labs and discussed the procedure including the risks, benefits and alternatives  for the proposed anesthesia with the patient or authorized representative who has indicated his/her understanding and acceptance.   Dental Advisory Given  Plan Discussed with: CRNA  Anesthesia Plan Comments:         Anesthesia Quick Evaluation

## 2017-06-13 NOTE — H&P (Signed)
Outpatient short stay form Pre-procedure 06/13/2017 11:10 AM Kyle Sails MD  Primary Physician: Dr. Einar Meadows  Reason for visit: Colonoscopy  History of present illness: Patient is a 72 year old male presenting today as above.  Last colonoscopy was 03/16/2004 at that time for rectal bleeding with a finding of some internal hemorrhoids.  2 small polypoid lesions were removed however these were consistent with a lymphoid nodule and a small hyperplastic polyp.  He tolerated his prep well.  He takes no aspirin or blood thinning agent.    Current Facility-Administered Medications:  .  0.9 %  sodium chloride infusion, , Intravenous, Continuous, Kyle Sails, MD, Last Rate: 20 mL/hr at 06/13/17 1017, 1,000 mL at 06/13/17 1017 .  0.9 %  sodium chloride infusion, , Intravenous, Continuous, Kyle Sails, MD .  0.9 %  sodium chloride infusion, , Intravenous, Continuous, Kyle Sails, MD  Medications Prior to Admission  Medication Sig Dispense Refill Last Dose  . fexofenadine (ALLEGRA) 180 MG tablet Take 1 tablet (180 mg total) by mouth daily as needed for allergies or rhinitis. (Patient taking differently: Take 180 mg by mouth every morning. ) 90 tablet 3 Past Week at Unknown time  . glucosamine-chondroitin 500-400 MG tablet Take 1 tablet by mouth 2 (two) times daily.   06/12/2017 at Unknown time  . hydrocortisone (PROCTOZONE-HC) 2.5 % rectal cream Place rectally daily as needed. 30 g 1 Past Week at Unknown time  . meloxicam (MOBIC) 15 MG tablet Take 15 mg by mouth 2 (two) times daily.    Past Week at Unknown time  . Multiple Vitamins-Minerals (CENTRUM SILVER ADULT 50+ PO) Take 1 tablet by mouth daily.   Past Week at Unknown time  . oxybutynin (DITROPAN) 5 MG tablet TK 1 T PO Q 8 H PRF URINARY FREQUENCY OR URGENCY  2 Past Week at Unknown time  . rosuvastatin (CRESTOR) 5 MG tablet Take 1 tablet (5 mg total) by mouth daily. 30 tablet 3 Past Week at Unknown time  .  senna-docusate (SENOKOT-S) 8.6-50 MG tablet Take 1 tablet by mouth daily.   Past Week at Unknown time     Allergies  Allergen Reactions  . Hydrocodone Other (See Comments)    hyper  . Percocet [Oxycodone-Acetaminophen] Other (See Comments)    "keeps me wide awake"     Past Medical History:  Diagnosis Date  . Arthritis   . Diverticulosis, sigmoid   . Environmental allergies   . Frequency of urination   . H/O sleep apnea    S/P  OSA surgery -- resolved  . Hemorrhoids   . History of BPH   . History of melanoma excision    back  . History of nephritis    age 69  . History of squamous cell carcinoma excision    right arm, leg, face  . History of urinary retention    secondary bph with obstruction  . Melanoma ([place])   . Urethral stricture     Review of systems:      Physical Exam    Heart and lungs: Regular rate and rhythm without rub or gallop, lungs are bilaterally clear.    HEENT: Normocephalic atraumatic eyes are anicteric    Other:    Pertinant exam for procedure: Soft nontender nondistended bowel sounds positive normoactive    Planned proceedures: Colonoscopy and indicated procedures. I have discussed the risks benefits and complications of procedures to include not limited to bleeding, infection, perforation and the risk of sedation  and the patient wishes to proceed.   Kyle Sails, MD Gastroenterology 06/13/2017  11:10 AM

## 2017-06-13 NOTE — Transfer of Care (Signed)
Immediate Anesthesia Transfer of Care Note  Patient: Kyle Meadows  Procedure(s) Performed: COLONOSCOPY WITH PROPOFOL (N/A )  Patient Location: PACU  Anesthesia Type:MAC  Level of Consciousness: sedated  Airway & Oxygen Therapy: Patient Spontanous Breathing  Post-op Assessment: Report given to RN  Post vital signs: stable  Last Vitals:  Vitals:   06/13/17 1003  BP: (!) 148/73  Pulse: 70  Resp: 20  Temp: (!) 36.1 C  SpO2: 100%    Last Pain:  Vitals:   06/13/17 1003  TempSrc: Tympanic  PainSc: 0-No pain         Complications: No apparent anesthesia complications

## 2017-06-13 NOTE — Op Note (Signed)
[facility] Gastroenterology Patient Name: Kyle Meadows Procedure Date: 06/13/2017 11:23 AM MRN: [id number] Account #: [id number] Date of Birth: Dec 21, 1945 Admit Type: Outpatient Age: 72 Room: [facility] ENDO ROOM 3 Gender: Male Note Status: Finalized Procedure:            Colonoscopy Indications:          Screening for colorectal malignant neoplasm Providers:            Lollie Sails, MD Referring MD:         Einar Pheasant, MD (Referring MD) Medicines:            Monitored Anesthesia Care Complications:        No immediate complications. Procedure:            Pre-Anesthesia Assessment:                       - ASA Grade Assessment: III - A patient with severe                        systemic disease.                       After obtaining informed consent, the colonoscope was                        passed under direct vision. Throughout the procedure,                        the patient's blood pressure, pulse, and oxygen                        saturations were monitored continuously. The Olympus                        PCF-H180AL colonoscope ( S#: [id number] ) was introduced                        through the anus and advanced to the the cecum,                        identified by appendiceal orifice and ileocecal valve.                        The colonoscopy was extremely difficult due to                        significant looping and a tortuous colon. Successful                        completion of the procedure was aided by changing the                        patient to a supine position, using manual pressure and                        withdrawing and reinserting the scope. The patient                        tolerated the procedure well. The quality of the bowel  preparation was good. Findings:      The sigmoid colon, descending colon, transverse colon and ascending       colon were significantly redundant.      A few small-mouthed  diverticula were found in the sigmoid colon and       descending colon.      A 2 mm polyp was found in the rectum. The polyp was sessile. The polyp       was removed with a cold biopsy forceps. Resection and retrieval were       complete.      A 4 mm polyp was found in the distal sigmoid colon. The polyp was       sessile. The polyp was removed with a cold biopsy forceps. The polyp was       removed with a piecemeal technique using a cold biopsy forceps.       Resection and retrieval were complete. Impression:           - Redundant colon.                       - Diverticulosis in the sigmoid colon and in the                        descending colon.                       - One 2 mm polyp in the rectum, removed with a cold                        biopsy forceps. Resected and retrieved.                       - One 4 mm polyp in the distal sigmoid colon, removed                        with a cold biopsy forceps and removed piecemeal using                        a cold biopsy forceps. Resected and retrieved. Recommendation:       - Discharge patient to home.                       - Soft diet today, then advance as tolerated to advance                        diet as tolerated.                       - Await pathology results. Procedure Code(s):    --- Professional ---                       [phone], Colonoscopy, flexible; with biopsy, single or                        multiple Diagnosis Code(s):    --- Professional ---                       Z12.11, Encounter for screening for malignant neoplasm  of colon                       K62.1, Rectal polyp                       D12.5, Benign neoplasm of sigmoid colon                       K57.30, Diverticulosis of large intestine without                        perforation or abscess without bleeding                       Q43.8, Other specified congenital malformations of                        intestine CPT copyright 2016 American  Medical Association. All rights reserved. The codes documented in this report are preliminary and upon coder review may  be revised to meet current compliance requirements. Lollie Sails, MD 06/13/2017 12:37:46 PM This report has been signed electronically. Number of Addenda: 0 Note Initiated On: 06/13/2017 11:23 AM Scope Withdrawal Time: 0 hours 5 minutes 16 seconds  Total Procedure Duration: 1 hour 3 minutes 43 seconds       [facility]

## 2017-06-13 NOTE — Anesthesia Postprocedure Evaluation (Signed)
Anesthesia Post Note  Patient: Kyle Meadows  Procedure(s) Performed: COLONOSCOPY WITH PROPOFOL (N/A )  Patient location during evaluation: PACU Anesthesia Type: General Level of consciousness: awake and alert Pain management: pain level controlled Vital Signs Assessment: post-procedure vital signs reviewed and stable Respiratory status: spontaneous breathing, nonlabored ventilation, respiratory function stable and patient connected to nasal cannula oxygen Cardiovascular status: blood pressure returned to baseline and stable Postop Assessment: no apparent nausea or vomiting Anesthetic complications: no     Last Vitals:  Vitals:   06/13/17 1003 06/13/17 1242  BP: (!) 148/73 99/69  Pulse: 70 (!) 56  Resp: 20 16  Temp: (!) 36.1 C (!) 36.3 C  SpO2: 100%     Last Pain:  Vitals:   06/13/17 1242  TempSrc: Tympanic  PainSc:                  Molli Barrows

## 2017-06-13 NOTE — Anesthesia Postprocedure Evaluation (Signed)
Anesthesia Post Note  Patient: Kyle Meadows  Procedure(s) Performed: COLONOSCOPY WITH PROPOFOL (N/A )  Patient location during evaluation: PACU Anesthesia Type: MAC Pain management: pain level controlled Vital Signs Assessment: post-procedure vital signs reviewed and stable Respiratory status: spontaneous breathing Cardiovascular status: blood pressure returned to baseline Anesthetic complications: no     Last Vitals:  Vitals:   06/13/17 1003 06/13/17 1200  BP: (!) 148/73 99/69  Pulse: 70 (!) 56  Resp: 20 16  Temp: (!) 36.1 C (!) 36.3 C  SpO2: 100% 100%    Last Pain:  Vitals:   06/13/17 1200  TempSrc: Tympanic  PainSc:                  Carron Curie

## 2017-06-13 NOTE — Anesthesia Post-op Follow-up Note (Signed)
Anesthesia QCDR form completed.        

## 2017-06-14 ENCOUNTER — Telehealth: Payer: Self-pay | Admitting: Internal Medicine

## 2017-06-14 ENCOUNTER — Other Ambulatory Visit: Payer: Self-pay

## 2017-06-14 MED ORDER — ROSUVASTATIN CALCIUM 5 MG PO TABS: 5.0000 mg | ORAL_TABLET | Freq: Every day | ORAL | 0 refills | Status: DC

## 2017-06-14 MED ORDER — ROSUVASTATIN CALCIUM 5 MG PO TABS: 5.0000 mg | ORAL_TABLET | Freq: Every day | ORAL | 3 refills | Status: DC

## 2017-06-14 NOTE — Telephone Encounter (Signed)
Patient aware that prescription was sent to pharmacy

## 2017-06-14 NOTE — Telephone Encounter (Signed)
Pt needs a refill on rosuvastatin (CRESTOR) 5 MG tablet sent [facility]  Pt is completely out

## 2017-06-14 NOTE — Telephone Encounter (Signed)
rx sent in. Left message for patient to call back.

## 2017-06-15 LAB — SURGICAL PATHOLOGY

## 2017-06-28 ENCOUNTER — Encounter: Payer: Self-pay | Admitting: Internal Medicine

## 2017-06-28 ENCOUNTER — Ambulatory Visit (INDEPENDENT_AMBULATORY_CARE_PROVIDER_SITE_OTHER): Payer: PPO | Admitting: Internal Medicine

## 2017-06-28 VITALS — BP 118/70 | HR 63 | Temp 98.3°F | Resp 18 | Ht 69.0 in | Wt 179.6 lb

## 2017-06-28 DIAGNOSIS — E7800 Pure hypercholesterolemia, unspecified: Secondary | ICD-10-CM | POA: Diagnosis not present

## 2017-06-28 DIAGNOSIS — I723 Aneurysm of iliac artery: Secondary | ICD-10-CM

## 2017-06-28 DIAGNOSIS — Z0000 Encounter for general adult medical examination without abnormal findings: Secondary | ICD-10-CM | POA: Diagnosis not present

## 2017-06-28 MED ORDER — HYDROCORTISONE 2.5 % RE CREA: TOPICAL_CREAM | Freq: Every day | RECTAL | 1 refills | Status: DC | PRN

## 2017-06-28 NOTE — Assessment & Plan Note (Signed)
Physical today 06/28/17.  Urology follows psa.  Colonoscopy 06/13/17 as outlined in overview.

## 2017-06-28 NOTE — Progress Notes (Signed)
[name], male   DOB: April 24, 1946, 72 y.o.   MRN: [id number]   Subjective:    [name], male    DOB: Sep 15, 1945, 72 y.o.   MRN: [id number]  HPI  Patient with past history of BPH.  He comes in today for follow up and for his physical exam.  He reports he is doing well.  Feels good.  Tries to stay active.  No chest pain.  No sob.  No acid reflux.  No abdominal pain.  Bowels moving.  No urine changes.  Sees urology.  Seeing ortho for his knees.     Past Medical History:  Diagnosis Date  . Arthritis   . Diverticulosis, sigmoid   . Environmental allergies   . Frequency of urination   . H/O sleep apnea    S/P  OSA surgery -- resolved  . Hemorrhoids   . History of BPH   . History of melanoma excision    back  . History of nephritis    age 42  . History of squamous cell carcinoma excision    right arm, leg, face  . History of urinary retention    secondary bph with obstruction  . Melanoma ([place])   . Urethral stricture    Past Surgical History:  Procedure Laterality Date  . COLONOSCOPY WITH PROPOFOL N/A 06/13/2017   Procedure: COLONOSCOPY WITH PROPOFOL;  Surgeon: Lollie Sails, MD;  Location: [facility] ENDOSCOPY;  Service: Endoscopy;  Laterality: N/A;  . CYSTOSCOPY WITH RETROGRADE URETHROGRAM N/A 05/08/2014   Procedure: CYSTOSCOPY WITH RETROGRADE URETHROGRAM;  Surgeon: Alexis Frock, MD;  Location: [facility];  Service: Urology;  Laterality: N/A;  . CYSTOSCOPY WITH URETHRAL DILATATION N/A 05/08/2014   Procedure: CYSTOSCOPY WITH URETHRAL DILATATION;  Surgeon: Alexis Frock, MD;  Location: [facility];  Service: Urology;  Laterality: N/A;  . KNEE ARTHROSCOPY W/ MENISCECTOMY Right x2   last one 2005  . LAPAROSCOPIC INGUINAL HERNIA REPAIR Right 2007 (approx)  . ORIF  RIGHT WRIST FX  2008   retained hardware  . ROBOT ASSISTED LAPAROSCOPIC RADICAL PROSTATECTOMY N/A 03/19/2014   Procedure: ROBOTIC ASSISTED LAPAROSCOPIC SIMPLE  PROSTATECTOMY;  Surgeon: Alexis Frock, MD;  Location: [facility];  Service: Urology;  Laterality: N/A;  . TONSILLECTOMY  age 35  . UVULOPALATOPHARYNGOPLASTY  x5  last one 1995   Family History  Problem Relation Age of Onset  . Heart disease Father        CABG x 4  . Arthritis Father   . Glaucoma Father   . Hepatitis C Mother   . Thyroid disease Sister    Social History   Socioeconomic History  . Marital status: Married    Spouse name: None  . Number of children: None  . Years of education: None  . Highest education level: None  Social Needs  . Financial resource strain: None  . Food insecurity - worry: None  . Food insecurity - inability: None  . Transportation needs - medical: None  . Transportation needs - non-medical: None  Occupational History  . None  Tobacco Use  . Smoking status: Never Smoker  . Smokeless tobacco: Never Used  Substance and Sexual Activity  . Alcohol use: Yes    Alcohol/week: 0.0 oz    Comment: rare  . Drug use: No  . Sexual activity: None  Other Topics Concern  . None  Social History Narrative  . None    Outpatient Encounter Medications as of 06/28/2017  Medication Sig  . fexofenadine (ALLEGRA) 180 MG tablet Take 1 tablet (180 mg total) by mouth daily as needed for allergies or rhinitis. (Patient taking differently: Take 180 mg by mouth every morning. )  . glucosamine-chondroitin 500-400 MG tablet Take 1 tablet by mouth 2 (two) times daily.  . hydrocortisone (PROCTOZONE-HC) 2.5 % rectal cream Place rectally daily as needed.  . meloxicam (MOBIC) 15 MG tablet Take 15 mg by mouth 2 (two) times daily.   . Multiple Vitamins-Minerals (CENTRUM SILVER ADULT 50+ PO) Take 1 tablet by mouth daily.  Marland Kitchen oxybutynin (DITROPAN) 5 MG tablet TK 1 T PO Q 8 H PRF URINARY FREQUENCY OR URGENCY  . rosuvastatin (CRESTOR) 5 MG tablet Take 1 tablet (5 mg total) by mouth daily.  Marland Kitchen senna-docusate (SENOKOT-S) 8.6-50 MG tablet Take 1 tablet by mouth daily.  . [DISCONTINUED]  hydrocortisone (PROCTOZONE-HC) 2.5 % rectal cream Place rectally daily as needed.   No facility-administered encounter medications on file as of 06/28/2017.     Review of Systems  Constitutional: Negative for appetite change and unexpected weight change.  HENT: Negative for congestion and sinus pressure.   Eyes: Negative for pain and visual disturbance.  Respiratory: Negative for cough, chest tightness and shortness of breath.   Cardiovascular: Negative for chest pain, palpitations and leg swelling.  Gastrointestinal: Negative for abdominal pain, diarrhea, nausea and vomiting.  Genitourinary: Negative for difficulty urinating and dysuria.  Musculoskeletal: Negative for gait problem and myalgias.  Skin: Negative for color change and rash.  Neurological: Negative for dizziness, light-headedness and headaches.  Hematological: Negative for adenopathy. Does not bruise/bleed easily.  Psychiatric/Behavioral: Negative for agitation and dysphoric mood.       Objective:     Blood pressure rechecked by me:  122/68  Physical Exam  Constitutional: He is oriented to person, place, and time. He appears well-developed and well-nourished. No distress.  HENT:  Head: Normocephalic and atraumatic.  Nose: Nose normal.  Mouth/Throat: Oropharynx is clear and moist. No oropharyngeal exudate.  Eyes: Conjunctivae are normal. Right eye exhibits no discharge. Left eye exhibits no discharge.  Neck: Neck supple. No thyromegaly present.  Cardiovascular: Normal rate and regular rhythm.  Pulmonary/Chest: Breath sounds normal. No respiratory distress. He has no wheezes.  Abdominal: Soft. Bowel sounds are normal. There is no tenderness.  Genitourinary:  Genitourinary Comments: Followed by urology..  Musculoskeletal: He exhibits no edema or tenderness.  Lymphadenopathy:    He has no cervical adenopathy.  Neurological: He is alert and oriented to person, place, and time.  Skin: Skin is warm and dry. No rash  noted.  Psychiatric: He has a normal mood and affect. His behavior is normal.    BP 118/70 (BP Location: Left Arm, Patient Position: Sitting, Cuff Size: Normal)   Pulse 63   Temp 98.3 F (36.8 C) (Oral)   Resp 18   Ht 5\' 9"  (1.753 m)   Wt 179 lb 9.6 oz (81.5 kg)   SpO2 97%   BMI 26.52 kg/m  Wt Readings from Last 3 Encounters:  06/28/17 179 lb 9.6 oz (81.5 kg)  06/13/17 173 lb (78.5 kg)  12/27/16 176 lb (79.8 kg)     Lab Results  Component Value Date   WBC 5.7 08/18/2016   HGB 14.2 08/18/2016   HCT 42.2 08/18/2016   PLT 211.0 08/18/2016   GLUCOSE 93 02/07/2017   CHOL 163 02/07/2017   TRIG 61.0 02/07/2017   HDL 49.60 02/07/2017   LDLCALC 102 (H) 02/07/2017   ALT 15  02/07/2017   AST 19 02/07/2017   NA 142 02/07/2017   K 4.0 02/07/2017   CL 106 02/07/2017   CREATININE 1.06 02/07/2017   BUN 26 (H) 02/07/2017   CO2 28 02/07/2017   TSH 1.17 08/18/2016       Assessment & Plan:   Problem List Items Addressed This Visit    Health care maintenance    Physical today 06/28/17.  Urology follows psa.  Colonoscopy 06/13/17 as outlined in overview.        Hypercholesterolemia    On crestor.  Low cholesterol diet and exercise.  Follow lipid panel and liver function tests.        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  Recommended annual follow up.        Other Visit Diagnoses    Routine general medical examination at a health care facility    -  Primary       Einar Pheasant, MD

## 2017-07-01 ENCOUNTER — Encounter: Payer: Self-pay | Admitting: Internal Medicine

## 2017-07-01 NOTE — Assessment & Plan Note (Signed)
On crestor.  Low cholesterol diet and exercise.  Follow lipid panel and liver function tests.   

## 2017-07-01 NOTE — Assessment & Plan Note (Signed)
Followed by Dr Lucky Cowboy 11/22/16.  Recommended annual follow up.

## 2017-07-03 ENCOUNTER — Emergency Department: Payer: PPO

## 2017-07-03 ENCOUNTER — Other Ambulatory Visit: Payer: Self-pay

## 2017-07-03 ENCOUNTER — Emergency Department
Admission: EM | Admit: 2017-07-03 | Discharge: 2017-07-04 | Disposition: A | Discharge status: Home / Self Care | Payer: PPO | Attending: Emergency Medicine | Admitting: Emergency Medicine

## 2017-07-03 ENCOUNTER — Encounter: Payer: Self-pay | Admitting: Emergency Medicine

## 2017-07-03 DIAGNOSIS — Z79899 Other long term (current) drug therapy: Secondary | ICD-10-CM | POA: Diagnosis not present

## 2017-07-03 DIAGNOSIS — K641 Second degree hemorrhoids: Secondary | ICD-10-CM

## 2017-07-03 DIAGNOSIS — R1030 Lower abdominal pain, unspecified: Secondary | ICD-10-CM | POA: Diagnosis present

## 2017-07-03 DIAGNOSIS — K5901 Slow transit constipation: Secondary | ICD-10-CM | POA: Diagnosis not present

## 2017-07-03 DIAGNOSIS — K5900 Constipation, unspecified: Secondary | ICD-10-CM | POA: Diagnosis not present

## 2017-07-03 MED ORDER — LIDOCAINE HCL 2 % EX GEL
1.0000 "application " | Freq: Once | CUTANEOUS | Status: AC
  Administered 2017-07-03: 1 via TOPICAL
  Filled 2017-07-03: qty 10

## 2017-07-03 MED ORDER — MAGNESIUM CITRATE PO SOLN
1.0000 | Freq: Once | ORAL | Status: AC
  Administered 2017-07-03: 1 via ORAL
  Filled 2017-07-03: qty 296

## 2017-07-03 MED ORDER — GLYCERIN (LAXATIVE) 2.1 G RE SUPP
1.0000 | Freq: Once | RECTAL | Status: AC
  Administered 2017-07-03: 1 via RECTAL
  Filled 2017-07-03: qty 1

## 2017-07-03 MED ORDER — LIDOCAINE HCL 2 % EX GEL
1.0000 "application " | Freq: Once | CUTANEOUS | Status: AC
  Administered 2017-07-04: 1 via TOPICAL
  Filled 2017-07-03: qty 10

## 2017-07-03 MED ORDER — SORBITOL 70 % SOLN
960.0000 mL | TOPICAL_OIL | Freq: Once | ORAL | Status: AC
  Administered 2017-07-04: 960 mL via RECTAL
  Filled 2017-07-03: qty 473

## 2017-07-03 NOTE — ED Provider Notes (Signed)
[facility] Emergency Department Provider Note   ____________________________________________   First MD Initiated Contact with Patient 07/03/17 2019     (approximate)  I have reviewed the triage vital signs and the nursing notes.   HISTORY  Chief Complaint Constipation    HPI CURREN Kyle Meadows is a 72 y.o. male patient complain lower abdominal pain and constipation for 3 days.  Patient had no relief with MiraLAX and a saline enema.  Patient rates his pain as 8/10.  Patient described the pain is "aching".  Patient also has rectal hemorrhoids.  Past Medical History:  Diagnosis Date  . Arthritis   . Diverticulosis, sigmoid   . Environmental allergies   . Frequency of urination   . H/O sleep apnea    S/P  OSA surgery -- resolved  . Hemorrhoids   . History of BPH   . History of melanoma excision    back  . History of nephritis    age 71  . History of squamous cell carcinoma excision    right arm, leg, face  . History of urinary retention    secondary bph with obstruction  . Melanoma ([place])   . Urethral stricture     Patient Active Problem List   Diagnosis Date Noted  . Abdominal pain 12/30/2016  . 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 Hypercholesterolemia 12/08/2015  . Health care maintenance 07/13/2014  . Prostatic hyperplasia, benign localized, with obstruction 03/19/2014  . Arthritis 10/09/2012  . Enlarged prostate 10/09/2012  . Environmental allergies 10/09/2012  . Skin cancer 10/09/2012  . Hemorrhoids 10/09/2012    Past Surgical History:  Procedure Laterality Date  . COLONOSCOPY WITH PROPOFOL N/A 06/13/2017   Procedure: COLONOSCOPY WITH PROPOFOL;  Surgeon: Lollie Sails, MD;  Location: [facility] ENDOSCOPY;  Service: Endoscopy;  Laterality: N/A;  . CYSTOSCOPY WITH RETROGRADE URETHROGRAM N/A 05/08/2014   Procedure: CYSTOSCOPY WITH RETROGRADE URETHROGRAM;  Surgeon: Alexis Frock, MD;  Location: [facility];  Service:  Urology;  Laterality: N/A;  . CYSTOSCOPY WITH URETHRAL DILATATION N/A 05/08/2014   Procedure: CYSTOSCOPY WITH URETHRAL DILATATION;  Surgeon: Alexis Frock, MD;  Location: [facility];  Service: Urology;  Laterality: N/A;  . KNEE ARTHROSCOPY W/ MENISCECTOMY Right x2   last one 2005  . LAPAROSCOPIC INGUINAL HERNIA REPAIR Right 2007 (approx)  . ORIF  RIGHT WRIST FX  2008   retained hardware  . ROBOT ASSISTED LAPAROSCOPIC RADICAL PROSTATECTOMY N/A 03/19/2014   Procedure: ROBOTIC ASSISTED LAPAROSCOPIC SIMPLE PROSTATECTOMY;  Surgeon: Alexis Frock, MD;  Location: [facility];  Service: Urology;  Laterality: N/A;  . TONSILLECTOMY  age 79  . UVULOPALATOPHARYNGOPLASTY  x5  last one 1995    Prior to Admission medications   Medication Sig Start Date End Date Taking? Authorizing Provider  docusate sodium (COLACE) 100 MG capsule Take 1 capsule (100 mg total) by mouth 2 (two) times daily. 07/04/17 07/04/18  Sable Feil, PA-C  fexofenadine (ALLEGRA) 180 MG tablet Take 1 tablet (180 mg total) by mouth daily as needed for allergies or rhinitis. Patient taking differently: Take 180 mg by mouth every morning.  05/22/13   Einar Pheasant, MD  glucosamine-chondroitin 500-400 MG tablet Take 1 tablet by mouth 2 (two) times daily.    [provider]  hydrocortisone (ANUSOL-HC) 25 MG suppository Place 1 suppository (25 mg total) rectally every 12 (twelve) hours. 07/04/17 07/04/18  Sable Feil, PA-C  hydrocortisone (PROCTOZONE-HC) 2.5 % rectal cream Place rectally daily as needed. 06/28/17  Einar Pheasant, MD  Lidocaine 2 % GEL Apply 1 application topically 2 (two) times daily. 07/04/17   Sable Feil, PA-C  meloxicam (MOBIC) 15 MG tablet Take 15 mg by mouth 2 (two) times daily.     [provider]  Multiple Vitamins-Minerals (CENTRUM SILVER ADULT 50+ PO) Take 1 tablet by mouth daily.    [provider]  oxybutynin (DITROPAN) 5 MG tablet TK 1 T PO Q 8 H PRF URINARY FREQUENCY  OR URGENCY 03/31/15   [provider]  rosuvastatin (CRESTOR) 5 MG tablet Take 1 tablet (5 mg total) by mouth daily. 06/14/17   Einar Pheasant, MD  senna-docusate (SENOKOT-S) 8.6-50 MG tablet Take 1 tablet by mouth daily.    [provider]    Allergies Hydrocodone and Percocet [oxycodone-acetaminophen]  Family History  Problem Relation Age of Onset  . Heart disease Father        CABG x 4  . Arthritis Father   . Glaucoma Father   . Hepatitis C Mother   . Thyroid disease Sister     Social History Social History   Tobacco Use  . Smoking status: Never Smoker  . Smokeless tobacco: Never Used  Substance Use Topics  . Alcohol use: Yes    Alcohol/week: 0.0 oz    Comment: rare  . Drug use: No    Review of Systems Constitutional: No fever/chills Eyes: No visual changes. ENT: No sore throat. Cardiovascular: Denies chest pain. Respiratory: Denies shortness of breath. Gastrointestinal: No abdominal pain.  No nausea, no vomiting.  No diarrhea.  No constipation.  Hemorrhoids. Genitourinary: Negative for dysuria.  BPH Musculoskeletal: Negative for back pain. Skin: Negative for rash. Neurological: Negative for headaches, focal weakness or numbness. Endocrine:Hypertension and hyperlipidemia. Allergic/Immunilogical: Hydrocodone ____________________________________________   PHYSICAL EXAM:  VITAL SIGNS: ED Triage Vitals  Enc Vitals Group     BP 07/03/17 1915 (!) 191/106     Pulse Rate 07/03/17 1915 81     Resp 07/03/17 1915 16     Temp 07/03/17 1915 97.6 F (36.4 C)     Temp Source 07/03/17 1915 Oral     SpO2 07/03/17 1915 96 %     Weight 07/03/17 1916 176 lb (79.8 kg)     Height 07/03/17 1916 5\' 9"  (1.753 m)     Head Circumference --      Peak Flow --      Pain Score 07/03/17 1916 8     Pain Loc --      Pain Edu? --      Excl. in [place]? --     Constitutional: Alert and oriented. Well appearing and in no acute distress. Eyes: Conjunctivae are normal.  PERRL. EOMI. Head: Atraumatic. Nose: No congestion/rhinnorhea. Mouth/Throat: Mucous membranes are moist.  Oropharynx non-erythematous. Neck: No stridor.   Hematological/Lymphatic/Immunilogical Cardiovascular: Normal rate, regular rhythm. Grossly normal heart sounds.  Good peripheral circulation.  Elevated blood pressure Respiratory: Normal respiratory effort.  No retractions. Lungs CTAB. Gastrointestinal: Soft and nontender. No distention. No abdominal bruits. No CVA tenderness.  Protruding rectal hemorrhoid.  ____________________________________________   LABS (all labs ordered are listed, but only abnormal results are displayed)  Labs Reviewed - No data to display ____________________________________________  EKG   ____________________________________________  RADIOLOGY  ED MD interpretation: KUB findings consistent with constipation follow obstruction.  Official radiology report(s): Dg Abdomen 1 View  Result Date: 07/03/2017 CLINICAL DATA:  Constipation for 3 days. EXAM: ABDOMEN - 1 VIEW COMPARISON:  None. FINDINGS: Increased stool burden  from splenic flexure through rectum consistent constipation with a few scattered mildly distended segments of large bowel in the right hemiabdomen. No free air is identified. There is lower lumbar degenerative facet arthropathy with L5-S1 assimilation joints. Surgical tacks project over the right inguinal region. No radio-opaque calculi or other significant radiographic abnormality are seen. IMPRESSION: Increased colonic stool burden compatible with constipation from approximately splenic flexure through rectum. Electronically Signed   By: Ashley Royalty M.D.   On: 07/03/2017 20:41    ____________________________________________   PROCEDURES  Procedure(s) performed: None  Procedures  Critical Care performed: No  ____________________________________________   INITIAL IMPRESSION / ASSESSMENT AND PLAN / ED COURSE  As part of my medical  decision making, I reviewed the following data within the electronic MEDICAL RECORD NUMBER    KUB findings consistent with moderate stool burden secondary to slow transit.  No obstruction.  Patient was refractory to glycerin suppository and 2 bottles of citrate of magnesium.  Patient obtained moderate relief with smog enema.  Patient given discharge care instruction,  advised take medication as directed.  Patient advised to follow-up with treating gastroenterologist for hemorrhoids.     ____________________________________________   FINAL CLINICAL IMPRESSION(S) / ED DIAGNOSES  Final diagnoses:  Slow transit constipation  Grade II hemorrhoids     ED Discharge Orders        Ordered    docusate sodium (COLACE) 100 MG capsule  2 times daily     07/04/17 0030    hydrocortisone (ANUSOL-HC) 25 MG suppository  Every 12 hours     07/04/17 0030    Lidocaine 2 % GEL  2 times daily     07/04/17 0030       Note:  This document was prepared using Dragon voice recognition software and may include unintentional dictation errors.    Sable Feil, PA-C 07/04/17 0145    Arta Silence, MD 07/04/17 1504

## 2017-07-03 NOTE — ED Triage Notes (Signed)
Pt arrived to the ED accompanied by his wife for complaints of constipation for 3 days unrelieved by taking Miralax and a saline enema. Pt is AOx4 in no apparent distress.

## 2017-07-03 NOTE — ED Notes (Signed)
See triage and provider note

## 2017-07-04 MED ORDER — LIDOCAINE 2 % EX GEL: 1.0000 "application " | Freq: Two times a day (BID) | CUTANEOUS | 1 refills | Status: DC

## 2017-07-04 MED ORDER — DOCUSATE SODIUM 100 MG PO CAPS: 100.0000 mg | ORAL_CAPSULE | Freq: Two times a day (BID) | ORAL | 2 refills | Status: AC

## 2017-07-04 MED ORDER — HYDROCORTISONE ACETATE 25 MG RE SUPP: 25.0000 mg | Freq: Two times a day (BID) | RECTAL | 1 refills | Status: DC

## 2017-07-05 DIAGNOSIS — K649 Unspecified hemorrhoids: Secondary | ICD-10-CM | POA: Diagnosis not present

## 2017-07-05 DIAGNOSIS — K5909 Other constipation: Secondary | ICD-10-CM | POA: Diagnosis not present

## 2017-07-06 ENCOUNTER — Other Ambulatory Visit: Payer: Self-pay

## 2017-07-13 DIAGNOSIS — K644 Residual hemorrhoidal skin tags: Secondary | ICD-10-CM | POA: Diagnosis not present

## 2017-07-13 DIAGNOSIS — K648 Other hemorrhoids: Secondary | ICD-10-CM | POA: Diagnosis not present

## 2017-07-21 DIAGNOSIS — M25562 Pain in left knee: Secondary | ICD-10-CM | POA: Diagnosis not present

## 2017-07-21 DIAGNOSIS — M25561 Pain in right knee: Secondary | ICD-10-CM | POA: Diagnosis not present

## 2017-07-26 DIAGNOSIS — K649 Unspecified hemorrhoids: Secondary | ICD-10-CM | POA: Diagnosis not present

## 2017-07-27 DIAGNOSIS — M1710 Unilateral primary osteoarthritis, unspecified knee: Secondary | ICD-10-CM | POA: Insufficient documentation

## 2017-07-27 DIAGNOSIS — M179 Osteoarthritis of knee, unspecified: Secondary | ICD-10-CM | POA: Insufficient documentation

## 2017-07-27 DIAGNOSIS — M170 Bilateral primary osteoarthritis of knee: Secondary | ICD-10-CM | POA: Diagnosis not present

## 2017-08-02 ENCOUNTER — Encounter: Payer: Self-pay | Admitting: Internal Medicine

## 2017-08-04 DIAGNOSIS — M170 Bilateral primary osteoarthritis of knee: Secondary | ICD-10-CM | POA: Diagnosis not present

## 2017-08-07 DIAGNOSIS — K644 Residual hemorrhoidal skin tags: Secondary | ICD-10-CM | POA: Diagnosis not present

## 2017-08-07 DIAGNOSIS — Z885 Allergy status to narcotic agent status: Secondary | ICD-10-CM | POA: Diagnosis not present

## 2017-08-07 DIAGNOSIS — K649 Unspecified hemorrhoids: Secondary | ICD-10-CM | POA: Diagnosis not present

## 2017-08-07 DIAGNOSIS — K648 Other hemorrhoids: Secondary | ICD-10-CM | POA: Diagnosis not present

## 2017-08-07 DIAGNOSIS — Z79899 Other long term (current) drug therapy: Secondary | ICD-10-CM | POA: Diagnosis not present

## 2017-08-07 DIAGNOSIS — M1990 Unspecified osteoarthritis, unspecified site: Secondary | ICD-10-CM | POA: Diagnosis not present

## 2017-08-08 DIAGNOSIS — R3912 Poor urinary stream: Secondary | ICD-10-CM | POA: Diagnosis not present

## 2017-08-08 DIAGNOSIS — N401 Enlarged prostate with lower urinary tract symptoms: Secondary | ICD-10-CM | POA: Diagnosis not present

## 2017-08-15 ENCOUNTER — Other Ambulatory Visit: Payer: PPO

## 2017-08-23 ENCOUNTER — Other Ambulatory Visit (INDEPENDENT_AMBULATORY_CARE_PROVIDER_SITE_OTHER): Payer: PPO

## 2017-08-23 DIAGNOSIS — E7800 Pure hypercholesterolemia, unspecified: Secondary | ICD-10-CM | POA: Diagnosis not present

## 2017-08-23 LAB — CBC WITH DIFFERENTIAL/PLATELET
BASOS ABS: 0.1 10*3/uL (ref 0.0–0.1)
Basophils Relative: 1 % (ref 0.0–3.0)
Eosinophils Absolute: 0.1 10*3/uL (ref 0.0–0.7)
Eosinophils Relative: 1.5 % (ref 0.0–5.0)
HCT: 41.8 % (ref 39.0–52.0)
Hemoglobin: 14.3 g/dL (ref 13.0–17.0)
LYMPHS ABS: 1.3 10*3/uL (ref 0.7–4.0)
Lymphocytes Relative: 16.2 % (ref 12.0–46.0)
MCHC: 34.1 g/dL (ref 30.0–36.0)
MCV: 91.5 fl (ref 78.0–100.0)
MONO ABS: 0.5 10*3/uL (ref 0.1–1.0)
Monocytes Relative: 6.2 % (ref 3.0–12.0)
Neutro Abs: 6.1 10*3/uL (ref 1.4–7.7)
Neutrophils Relative %: 75.1 % (ref 43.0–77.0)
PLATELETS: 291 10*3/uL (ref 150.0–400.0)
RBC: 4.57 Mil/uL (ref 4.22–5.81)
RDW: 13.4 % (ref 11.5–15.5)
WBC: 8.1 10*3/uL (ref 4.0–10.5)

## 2017-08-23 LAB — BASIC METABOLIC PANEL
BUN: 20 mg/dL (ref 6–23)
CO2: 30 mEq/L (ref 19–32)
Calcium: 9.3 mg/dL (ref 8.4–10.5)
Chloride: 104 mEq/L (ref 96–112)
Creatinine, Ser: 1 mg/dL (ref 0.40–1.50)
GFR: 78.13 mL/min (ref >60.00)
GLUCOSE: 90 mg/dL (ref 70–99)
POTASSIUM: 4.1 meq/L (ref 3.5–5.1)
SODIUM: 141 meq/L (ref 135–145)

## 2017-08-23 LAB — HEPATIC FUNCTION PANEL
ALBUMIN: 4.3 g/dL (ref 3.5–5.2)
ALT: 14 U/L (ref 0–53)
AST: 17 U/L (ref 0–37)
Alkaline Phosphatase: 49 U/L (ref 39–117)
Bilirubin, Direct: 0.1 mg/dL (ref 0.0–0.3)
TOTAL PROTEIN: 6.9 g/dL (ref 6.0–8.3)
Total Bilirubin: 0.6 mg/dL (ref 0.2–1.2)

## 2017-08-23 LAB — LIPID PANEL
CHOLESTEROL: 159 mg/dL (ref 0–200)
HDL: 42.2 mg/dL (ref >39.00)
LDL Cholesterol: 101 mg/dL — ABNORMAL HIGH (ref 0–99)
NonHDL: 116.42
TRIGLYCERIDES: 77 mg/dL (ref 0.0–149.0)
Total CHOL/HDL Ratio: 4
VLDL: 15.4 mg/dL (ref 0.0–40.0)

## 2017-08-23 LAB — TSH: TSH: 1.11 u[IU]/mL (ref 0.35–4.50)

## 2017-08-24 ENCOUNTER — Other Ambulatory Visit: Payer: Self-pay

## 2017-08-24 MED ORDER — ROSUVASTATIN CALCIUM 10 MG PO TABS: 10.0000 mg | ORAL_TABLET | Freq: Every day | ORAL | 3 refills | Status: DC

## 2017-08-29 DIAGNOSIS — Z09 Encounter for follow-up examination after completed treatment for conditions other than malignant neoplasm: Secondary | ICD-10-CM | POA: Diagnosis not present

## 2017-09-12 DIAGNOSIS — B372 Candidiasis of skin and nail: Secondary | ICD-10-CM | POA: Diagnosis not present

## 2017-10-21 ENCOUNTER — Other Ambulatory Visit: Payer: Self-pay

## 2017-10-21 ENCOUNTER — Encounter: Payer: Self-pay | Admitting: *Deleted

## 2017-10-21 ENCOUNTER — Emergency Department
Admission: EM | Admit: 2017-10-21 | Discharge: 2017-10-21 | Disposition: A | Discharge status: Home / Self Care | Payer: PPO | Attending: Emergency Medicine | Admitting: Emergency Medicine

## 2017-10-21 DIAGNOSIS — Z79899 Other long term (current) drug therapy: Secondary | ICD-10-CM | POA: Diagnosis not present

## 2017-10-21 DIAGNOSIS — E7800 Pure hypercholesterolemia, unspecified: Secondary | ICD-10-CM | POA: Diagnosis not present

## 2017-10-21 DIAGNOSIS — R319 Hematuria, unspecified: Secondary | ICD-10-CM | POA: Diagnosis not present

## 2017-10-21 LAB — CBC
HCT: 43.6 % (ref 40.0–52.0)
Hemoglobin: 15 g/dL (ref 13.0–18.0)
MCH: 31.7 pg (ref 26.0–34.0)
MCHC: 34.4 g/dL (ref 32.0–36.0)
MCV: 92.2 fL (ref 80.0–100.0)
Platelets: 223 10*3/uL (ref 150–440)
RBC: 4.73 MIL/uL (ref 4.40–5.90)
RDW: 13.7 % (ref 11.5–14.5)
WBC: 6.9 10*3/uL (ref 3.8–10.6)

## 2017-10-21 LAB — URINALYSIS, COMPLETE (UACMP) WITH MICROSCOPIC
SQUAMOUS EPITHELIAL / LPF: NONE SEEN (ref 0–5)
Specific Gravity, Urine: 1.017 (ref 1.005–1.030)
WBC, UA: >50 WBC/hpf — ABNORMAL HIGH (ref 0–5)

## 2017-10-21 LAB — BASIC METABOLIC PANEL
ANION GAP: 6 (ref 5–15)
BUN: 25 mg/dL — AB (ref 8–23)
CALCIUM: 9.7 mg/dL (ref 8.9–10.3)
CO2: 28 mmol/L (ref 22–32)
CREATININE: 0.97 mg/dL (ref 0.61–1.24)
Chloride: 106 mmol/L (ref 98–111)
GFR calc Af Amer: >60 mL/min (ref >60)
GLUCOSE: 97 mg/dL (ref 70–99)
Potassium: 4.1 mmol/L (ref 3.5–5.1)
Sodium: 140 mmol/L (ref 135–145)

## 2017-10-21 MED ORDER — CEPHALEXIN 500 MG PO CAPS: 500.0000 mg | ORAL_CAPSULE | Freq: Three times a day (TID) | ORAL | 0 refills | Status: AC

## 2017-10-21 MED ORDER — SODIUM CHLORIDE 0.9 % IV BOLUS
1000.0000 mL | Freq: Once | INTRAVENOUS | Status: AC
  Administered 2017-10-21: 1000 mL via INTRAVENOUS

## 2017-10-21 MED ORDER — CEPHALEXIN 500 MG PO CAPS
500.0000 mg | ORAL_CAPSULE | Freq: Once | ORAL | Status: AC
  Administered 2017-10-21: 500 mg via ORAL
  Filled 2017-10-21: qty 1

## 2017-10-21 NOTE — ED Provider Notes (Addendum)
[facility] Emergency Department Provider Note  ____________________________________________   I have reviewed the triage vital signs and the nursing notes. Where available I have reviewed prior notes and, if possible and indicated, outside hospital notes.    HISTORY  Chief Complaint Hematuria    HPI Kyle Meadows is a 72 y.o. male he had a history of prostatectomy 3 years ago, who is not on any blood thinners, is in his normal state of health and continues to be except for the fact that he is passing blood in his urine since this afternoon.  He denies any flank pain no dysuria no urinary frequency no fever no nausea no vomiting no easy bleeding or bruising, is under present normal except for this blood in his urine.  He does feel that he is completely emptying his bladder.  He denies any trauma.  He is followed by Dr. Tresa Moore of urology.   Past Medical History:  Diagnosis Date  . Arthritis   . Diverticulosis, sigmoid   . Environmental allergies   . Frequency of urination   . H/O sleep apnea    S/P  OSA surgery -- resolved  . Hemorrhoids   . History of BPH   . History of melanoma excision    back  . History of nephritis    age 81  . History of squamous cell carcinoma excision    right arm, leg, face  . History of urinary retention    secondary bph with obstruction  . Melanoma ([place])   . Urethral stricture     Patient Active Problem List   Diagnosis Date Noted  . Abdominal pain 12/30/2016  . Iliac artery aneurysm ([place]) 11/22/2016  . Hypercholesterolemia 12/08/2015  . Health care maintenance 07/13/2014  . Prostatic hyperplasia, benign localized, with obstruction 03/19/2014  . Arthritis 10/09/2012  . Enlarged prostate 10/09/2012  . Environmental allergies 10/09/2012  . Skin cancer 10/09/2012  . Hemorrhoids 10/09/2012    Past Surgical History:  Procedure Laterality Date  . COLONOSCOPY WITH PROPOFOL N/A 06/13/2017   Procedure: COLONOSCOPY WITH  PROPOFOL;  Surgeon: Lollie Sails, MD;  Location: [facility] ENDOSCOPY;  Service: Endoscopy;  Laterality: N/A;  . CYSTOSCOPY WITH RETROGRADE URETHROGRAM N/A 05/08/2014   Procedure: CYSTOSCOPY WITH RETROGRADE URETHROGRAM;  Surgeon: Alexis Frock, MD;  Location: [facility];  Service: Urology;  Laterality: N/A;  . CYSTOSCOPY WITH URETHRAL DILATATION N/A 05/08/2014   Procedure: CYSTOSCOPY WITH URETHRAL DILATATION;  Surgeon: Alexis Frock, MD;  Location: [facility];  Service: Urology;  Laterality: N/A;  . KNEE ARTHROSCOPY W/ MENISCECTOMY Right x2   last one 2005  . LAPAROSCOPIC INGUINAL HERNIA REPAIR Right 2007 (approx)  . ORIF  RIGHT WRIST FX  2008   retained hardware  . ROBOT ASSISTED LAPAROSCOPIC RADICAL PROSTATECTOMY N/A 03/19/2014   Procedure: ROBOTIC ASSISTED LAPAROSCOPIC SIMPLE PROSTATECTOMY;  Surgeon: Alexis Frock, MD;  Location: [facility] ORS;  Service: Urology;  Laterality: N/A;  . TONSILLECTOMY  age 50  . UVULOPALATOPHARYNGOPLASTY  x5  last one 1995    Prior to Admission medications   Medication Sig Start Date End Date Taking? Authorizing Provider  docusate sodium (COLACE) 100 MG capsule Take 1 capsule (100 mg total) by mouth 2 (two) times daily. 07/04/17 07/04/18  Sable Feil, PA-C  fexofenadine (ALLEGRA) 180 MG tablet Take 1 tablet (180 mg total) by mouth daily as needed for allergies or rhinitis. Patient taking differently: Take 180 mg by mouth every morning.  05/22/13   Nicki Reaper,  Charlene, MD  glucosamine-chondroitin 500-400 MG tablet Take 1 tablet by mouth 2 (two) times daily.    [provider]  hydrocortisone (ANUSOL-HC) 25 MG suppository Place 1 suppository (25 mg total) rectally every 12 (twelve) hours. 07/04/17 07/04/18  Sable Feil, PA-C  hydrocortisone (PROCTOZONE-HC) 2.5 % rectal cream Place rectally daily as needed. 06/28/17   Einar Pheasant, MD  Lidocaine 2 % GEL Apply 1 application topically 2 (two) times daily. 07/04/17   Sable Feil,  PA-C  meloxicam (MOBIC) 15 MG tablet Take 15 mg by mouth 2 (two) times daily.     [provider]  Multiple Vitamins-Minerals (CENTRUM SILVER ADULT 50+ PO) Take 1 tablet by mouth daily.    [provider]  oxybutynin (DITROPAN) 5 MG tablet TK 1 T PO Q 8 H PRF URINARY FREQUENCY OR URGENCY 03/31/15   [provider]  rosuvastatin (CRESTOR) 10 MG tablet Take 1 tablet (10 mg total) by mouth daily. 08/24/17   Einar Pheasant, MD  senna-docusate (SENOKOT-S) 8.6-50 MG tablet Take 1 tablet by mouth daily.    [provider]    Allergies Hydrocodone and Percocet [oxycodone-acetaminophen]  Family History  Problem Relation Age of Onset  . Heart disease Father        CABG x 4  . Arthritis Father   . Glaucoma Father   . Hepatitis C Mother   . Thyroid disease Sister     Social History Social History   Tobacco Use  . Smoking status: Never Smoker  . Smokeless tobacco: Never Used  Substance Use Topics  . Alcohol use: Yes    Alcohol/week: 0.0 oz    Comment: rare  . Drug use: No    Review of Systems Constitutional: No fever/chills Eyes: No visual changes. ENT: No sore throat. No stiff neck no neck pain Cardiovascular: Denies chest pain. Respiratory: Denies shortness of breath. Gastrointestinal:   no vomiting.  No diarrhea.  No constipation. Genitourinary: Negative for dysuria. Musculoskeletal: Negative lower extremity swelling Skin: Negative for rash. Neurological: Negative for severe headaches, focal weakness or numbness.   ____________________________________________   PHYSICAL EXAM:  VITAL SIGNS: ED Triage Vitals  Enc Vitals Group     BP 10/21/17 1740 (!) 164/84     Pulse Rate 10/21/17 1740 66     Resp 10/21/17 1740 16     Temp 10/21/17 1740 98.2 F (36.8 C)     Temp Source 10/21/17 1740 Oral     SpO2 10/21/17 1740 98 %     Weight 10/21/17 1740 170 lb (77.1 kg)     Height 10/21/17 1740 5\' 9"  (1.753 m)     Head Circumference --       Peak Flow --      Pain Score 10/21/17 1747 0     Pain Loc --      Pain Edu? --      Excl. in [place]? --     Constitutional: Alert and oriented. Well appearing and in no acute distress. Eyes: Conjunctivae are normal Head: Atraumatic HEENT: No congestion/rhinnorhea. Mucous membranes are moist.  Oropharynx non-erythematous Neck:   Nontender with no meningismus, no masses, no stridor Cardiovascular: Normal rate, regular rhythm. Grossly normal heart sounds.  Good peripheral circulation. Respiratory: Normal respiratory effort.  No retractions. Lungs CTAB. Abdominal: Soft and nontender. No distention. No guarding no rebound Back:  There is no focal tenderness or step off.  there is no midline tenderness there are no lesions noted. there is no CVA  tenderness External genitalia no pain no bleeding no lesions noted Musculoskeletal: No lower extremity tenderness, no upper extremity tenderness. No joint effusions, no DVT signs strong distal pulses no edema Neurologic:  Normal speech and language. No gross focal neurologic deficits are appreciated.  Skin:  Skin is warm, dry and intact. No rash noted. Psychiatric: Mood and affect are normal. Speech and behavior are normal.  ____________________________________________   LABS (all labs ordered are listed, but only abnormal results are displayed)  Labs Reviewed  URINALYSIS, COMPLETE (UACMP) WITH MICROSCOPIC - Abnormal; Notable for the following components:      Result Value   Color, Urine RED (*)    APPearance CLOUDY (*)    Glucose, UA   (*)    Value: TEST NOT REPORTED DUE TO COLOR INTERFERENCE OF URINE PIGMENT   Hgb urine dipstick   (*)    Value: TEST NOT REPORTED DUE TO COLOR INTERFERENCE OF URINE PIGMENT   Bilirubin Urine   (*)    Value: TEST NOT REPORTED DUE TO COLOR INTERFERENCE OF URINE PIGMENT   Ketones, ur   (*)    Value: TEST NOT REPORTED DUE TO COLOR INTERFERENCE OF URINE PIGMENT   Protein, ur   (*)    Value: TEST NOT REPORTED DUE TO  COLOR INTERFERENCE OF URINE PIGMENT   Nitrite   (*)    Value: TEST NOT REPORTED DUE TO COLOR INTERFERENCE OF URINE PIGMENT   Leukocytes, UA   (*)    Value: TEST NOT REPORTED DUE TO COLOR INTERFERENCE OF URINE PIGMENT   RBC / HPF >50 (*)    WBC, UA >50 (*)    Bacteria, UA FEW (*)    All other components within normal limits  BASIC METABOLIC PANEL - Abnormal; Notable for the following components:   BUN 25 (*)    All other components within normal limits  CBC    Pertinent labs  results that were available during my care of the patient were reviewed by me and considered in my medical decision making (see chart for details). ____________________________________________  EKG   ____________________________________________  RADIOLOGY  Pertinent labs & imaging results that were available during my care of the patient were reviewed by me and considered in my medical decision making (see chart for details). If possible, patient and/or family made aware of any abnormal findings.  No results found. ____________________________________________    PROCEDURES  Procedure(s) performed: None  Procedures  Critical Care performed: None  ____________________________________________   INITIAL IMPRESSION / ASSESSMENT AND PLAN / ED COURSE  Pertinent labs & imaging results that were available during my care of the patient were reviewed by me and considered in my medical decision making (see chart for details).  Postvoid residual volume is 80, about an hour after he voided.  Not retaining.  I did discuss with Dr. Gloriann Loan, we discussed patient's lab findings BUN/creatinine etc. hemoglobin and urinalysis.  He does agree with discharge and close outpatient follow-up does not wish any imaging.  They will see him in the office on Monday.  In addition, he does agree with empiric antibiotics which we will start here.  Patient in no acute distress.  We will give IV fluids here prior to discharge as well as  BUN/creatinine ratio somewhat elevated.  This also help and flushes letter.  Return precautions given and understood.   ----------------------------------------- 10:07 PM on 10/21/2017 -----------------------------------------  he has concerned that he will not be able to pass urine.  He is able to  pass all of the urine in his bladder at this time.  He is passing some clots.  He is not any blood thinners or aspirin.  I did discuss again with Dr. Gloriann Loan.  Patient does have a history of urethral strictures.  It is very difficult to get a Foley catheter and last time they can only get a tiny catheter in him.  I discussed with Dr. Gloriann Loan again.  Dr. Gloriann Loan does not wish me to place a catheter in this patient.  He feels that the risk of urethral irritation and subsequent obstruction outweighs the benefits of getting a tiny Foley and that it may clot off anyway.  This is been explained to the patient.  Do understand that there is a chance the patient may have urinary retention later and require Foley but at this time as he is able to void we will hold off on doing it given the possible complications of performing a urinary catheterization.  Patient comfortable with this plan.  He was able to void 280 cc an empty his bladder prior to discharge.  He is not on any blood thinners at coagulation profile is not indicated at this time.    ____________________________________________   FINAL CLINICAL IMPRESSION(S) / ED DIAGNOSES  Final diagnoses:  None      This chart was dictated using voice recognition software.  Despite best efforts to proofread,  errors can occur which can change meaning.      Schuyler Amor, MD 10/21/17 1946    Schuyler Amor, MD 10/21/17 1947    Schuyler Amor, MD 10/21/17 2208    Schuyler Amor, MD 10/21/17 2237

## 2017-10-21 NOTE — ED Notes (Addendum)
Patient c/o hematuria with clots present in urine beginning at 1715 today. Patient denies pain. Patient reports hx of prostate removal. Patient denies hx of kidney stones. Patient reports discomfort with palpation of lower abdomen/pelvis, denies pain.

## 2017-10-21 NOTE — Discharge Instructions (Signed)
If you have pain or feel that you cannot void urine, if you have a fever or feel lightheaded or you feel worse in any way please return to the emergency room.  Please follow closely with primary care doctor and urology.  They will expect a call on Monday.  Please take the antibiotics until they are gone.

## 2017-10-21 NOTE — ED Triage Notes (Signed)
Pt to ED with new onset of hematuria today. No trauma to flank area. Urine is dark red and toilet is filled with dark red blood. Pt denies pain and denies feeling urgency or frequency. No lightheadedness or dizziness.  Pt has had prostate removed.

## 2017-10-21 NOTE — ED Notes (Signed)
Registration at bedside.

## 2017-10-21 NOTE — ED Notes (Signed)
Patient reports continual passing of blood clots in urine. MD informed. Patient reassured that this was expected. Patient verbalized understanding of information discussed

## 2017-10-21 NOTE — ED Notes (Signed)
Reviewed discharge instructions, follow-up care, and prescriptions with patient. Patient verbalized understanding of all information reviewed. Patient stable, with no distress noted at this time.    Patient provided with urinal for home use per patient request.

## 2017-10-21 NOTE — ED Notes (Signed)
ED Provider at bedside. 

## 2017-10-23 LAB — URINE CULTURE: Culture: <10000 — AB

## 2017-10-24 DIAGNOSIS — R310 Gross hematuria: Secondary | ICD-10-CM | POA: Diagnosis not present

## 2017-10-31 DIAGNOSIS — R310 Gross hematuria: Secondary | ICD-10-CM | POA: Diagnosis not present

## 2017-11-06 DIAGNOSIS — L03032 Cellulitis of left toe: Secondary | ICD-10-CM | POA: Diagnosis not present

## 2017-11-23 DIAGNOSIS — R310 Gross hematuria: Secondary | ICD-10-CM | POA: Diagnosis not present

## 2017-11-23 DIAGNOSIS — N401 Enlarged prostate with lower urinary tract symptoms: Secondary | ICD-10-CM | POA: Diagnosis not present

## 2017-11-23 DIAGNOSIS — R3915 Urgency of urination: Secondary | ICD-10-CM | POA: Diagnosis not present

## 2017-11-24 ENCOUNTER — Ambulatory Visit (INDEPENDENT_AMBULATORY_CARE_PROVIDER_SITE_OTHER): Payer: PPO

## 2017-11-24 ENCOUNTER — Ambulatory Visit (INDEPENDENT_AMBULATORY_CARE_PROVIDER_SITE_OTHER): Payer: PPO | Admitting: Vascular Surgery

## 2017-11-24 ENCOUNTER — Encounter (INDEPENDENT_AMBULATORY_CARE_PROVIDER_SITE_OTHER): Payer: Self-pay | Admitting: Vascular Surgery

## 2017-11-24 VITALS — BP 136/76 | HR 48 | Resp 13 | Ht 69.0 in | Wt 174.0 lb

## 2017-11-24 DIAGNOSIS — I723 Aneurysm of iliac artery: Secondary | ICD-10-CM

## 2017-11-24 DIAGNOSIS — E7800 Pure hypercholesterolemia, unspecified: Secondary | ICD-10-CM

## 2017-11-24 NOTE — Progress Notes (Signed)
Subjective:    [name], male    DOB: Mar 27, 1946, 72 y.o.   MRN: [id number] Chief Complaint  Patient presents with  . Follow-up    1 year Aortic Iliac f/u   Patient presents for a yearly bilateral iliac artery aneurysm follow-up.  The patient presents today with a complaint.  The patient denies any claudication-like symptoms, rest pain or ulcer formation to the bilateral lower extremity.  Patient denies any buttocks or thigh pain.  Patient underwent an aortoiliac duplex which was notable for stable internal iliac artery aneurysms measuring 2.0 cm in the right and 1.9 cm on the left.  Biphasic blood flow throughout the aortoiliac system. This is stable when compared to the previous exam on 11/22/2016.  Patient denies any fever, nausea vomiting.  Review of Systems  Constitutional: Negative.   HENT: Negative.   Eyes: Negative.   Respiratory: Negative.   Cardiovascular: Negative.   Gastrointestinal: Negative.   Endocrine: Negative.   Genitourinary: Negative.   Musculoskeletal: Negative.   Skin: Negative.   Allergic/Immunologic: Negative.   Neurological: Negative.   Hematological: Negative.   Psychiatric/Behavioral: Negative.       Objective:   Physical Exam  Constitutional: He is oriented to person, place, and time. He appears well-developed and well-nourished. No distress.  HENT:  Head: Normocephalic and atraumatic.  Right Ear: External ear normal.  Left Ear: External ear normal.  Eyes: Pupils are equal, round, and reactive to light. Conjunctivae and EOM are normal.  Neck: Normal range of motion.  Cardiovascular: Normal rate, regular rhythm, normal heart sounds and intact distal pulses.  Pulses:      Radial pulses are 2+ on the right side, and 2+ on the left side.       Dorsalis pedis pulses are 2+ on the right side, and 2+ on the left side.       Posterior tibial pulses are 2+ on the right side, and 2+ on the left side.  Pulmonary/Chest: Effort normal and  breath sounds normal.  Musculoskeletal: Normal range of motion. He exhibits no edema.  Neurological: He is alert and oriented to person, place, and time.  Skin: Skin is warm and dry. He is not diaphoretic.  Psychiatric: He has a normal mood and affect. His behavior is normal. Judgment and thought content normal.  Vitals reviewed.  BP 136/76 (BP Location: Right Arm, Patient Position: Sitting)   Pulse (!) 48   Resp 13   Ht 5\' 9"  (1.753 m)   Wt 174 lb (78.9 kg)   BMI 25.70 kg/m   Past Medical History:  Diagnosis Date  . Arthritis   . Diverticulosis, sigmoid   . Environmental allergies   . Frequency of urination   . H/O sleep apnea    S/P  OSA surgery -- resolved  . Hemorrhoids   . History of BPH   . History of melanoma excision    back  . History of nephritis    age 49  . History of squamous cell carcinoma excision    right arm, leg, face  . History of urinary retention    secondary bph with obstruction  . Melanoma ([place])   . Urethral stricture    Social History   Socioeconomic History  . Marital status: Married    Spouse name: Not on file  . Number of children: Not on file  . Years of education: Not on file  . Highest education level: Not on file  Occupational History  .  Not on file  Social Needs  . Financial resource strain: Not on file  . Food insecurity:    Worry: Not on file    Inability: Not on file  . Transportation needs:    Medical: Not on file    Non-medical: Not on file  Tobacco Use  . Smoking status: Never Smoker  . Smokeless tobacco: Never Used  Substance and Sexual Activity  . Alcohol use: Yes    Alcohol/week: 0.0 oz    Comment: rare  . Drug use: No  . Sexual activity: Not on file  Lifestyle  . Physical activity:    Days per week: Not on file    Minutes per session: Not on file  . Stress: Not on file  Relationships  . Social connections:    Talks on phone: Not on file    Gets together: Not on file    Attends religious service: Not on  file    Active member of club or organization: Not on file    Attends meetings of clubs or organizations: Not on file    Relationship status: Not on file  . Intimate partner violence:    Fear of current or ex partner: Not on file    Emotionally abused: Not on file    Physically abused: Not on file    Forced sexual activity: Not on file  Other Topics Concern  . Not on file  Social History Narrative  . Not on file   Past Surgical History:  Procedure Laterality Date  . COLONOSCOPY WITH PROPOFOL N/A 06/13/2017   Procedure: COLONOSCOPY WITH PROPOFOL;  Surgeon: Lollie Sails, MD;  Location: [facility] ENDOSCOPY;  Service: Endoscopy;  Laterality: N/A;  . CYSTOSCOPY WITH RETROGRADE URETHROGRAM N/A 05/08/2014   Procedure: CYSTOSCOPY WITH RETROGRADE URETHROGRAM;  Surgeon: Alexis Frock, MD;  Location: [facility];  Service: Urology;  Laterality: N/A;  . CYSTOSCOPY WITH URETHRAL DILATATION N/A 05/08/2014   Procedure: CYSTOSCOPY WITH URETHRAL DILATATION;  Surgeon: Alexis Frock, MD;  Location: [facility];  Service: Urology;  Laterality: N/A;  . KNEE ARTHROSCOPY W/ MENISCECTOMY Right x2   last one 2005  . LAPAROSCOPIC INGUINAL HERNIA REPAIR Right 2007 (approx)  . ORIF  RIGHT WRIST FX  2008   retained hardware  . ROBOT ASSISTED LAPAROSCOPIC RADICAL PROSTATECTOMY N/A 03/19/2014   Procedure: ROBOTIC ASSISTED LAPAROSCOPIC SIMPLE PROSTATECTOMY;  Surgeon: Alexis Frock, MD;  Location: [facility]S;  Service: Urology;  Laterality: N/A;  . TONSILLECTOMY  age 65  . UVULOPALATOPHARYNGOPLASTY  x5  last one 1995   Family History  Problem Relation Age of Onset  . Heart disease Father        CABG x 4  . Arthritis Father   . Glaucoma Father   . Hepatitis C Mother   . Thyroid disease Sister    Allergies  Allergen Reactions  . Hydrocodone Other (See Comments)    hyper  . Percocet [Oxycodone-Acetaminophen] Other (See Comments)    "keeps me wide awake"      Assessment & Plan:    Patient presents for a yearly bilateral iliac artery aneurysm follow-up.  The patient presents today with a complaint.  The patient denies any claudication-like symptoms, rest pain or ulcer formation to the bilateral lower extremity.  Patient denies any buttocks or thigh pain.  Patient underwent an aortoiliac duplex which was notable for stable internal iliac artery aneurysms measuring 2.0 cm in the right and 1.9 cm on the left.  Biphasic blood flow throughout  the aortoiliac system. This is stable when compared to the previous exam on 11/22/2016.  Patient denies any fever, nausea vomiting.  1. Iliac artery aneurysm (HCC) - Stable Patient presents for a yearly bilateral iliac artery aneurysm follow-up Iliac aneurysms are stable measuring 2.0 on the right and 1.9 on the left. He presents today without complaint.  He denies any claudication-like symptoms, rest pain or ulcers to the bilateral lower extremity. Should to follow-up in 1 year with an aortoiliac I have discussed with the patient at length the risk factors for and pathogenesis of atherosclerotic disease and encouraged a healthy diet, regular exercise regimen and blood pressure / glucose control.  The patient was encouraged to call the office in the interim if he experiences any claudication like symptoms, rest pain or ulcers to his feet / toes.  - VAS US AORTA/IVC/ILIACS; Future  2. Hypercholesterolemia - Stable Encouraged good control as its slows the progression of atherosclerotic disease  Current Outpatient Medications on File Prior to Visit  Medication Sig Dispense Refill  . Dermatological Products, Misc. ([place]) EMUL Apply topically.    . docusate sodium (COLACE) 100 MG capsule Take 1 capsule (100 mg total) by mouth 2 (two) times daily. 10 capsule 2  . fexofenadine (ALLEGRA) 180 MG tablet Take 1 tablet (180 mg total) by mouth daily as needed for allergies or rhinitis. (Patient taking differently: Take 180 mg by mouth every morning.  ) 90 tablet 3  . glucosamine-chondroitin 500-400 MG tablet Take 1 tablet by mouth 2 (two) times daily.    . meloxicam (MOBIC) 15 MG tablet Take 15 mg by mouth 2 (two) times daily.     . Multiple Vitamins-Minerals (CENTRUM SILVER ADULT 50+ PO) Take 1 tablet by mouth daily.    Marland Kitchen oxybutynin (DITROPAN) 5 MG tablet TK 1 T PO Q 8 H PRF URINARY FREQUENCY OR URGENCY  2  . rosuvastatin (CRESTOR) 10 MG tablet Take 1 tablet (10 mg total) by mouth daily. 90 tablet 3  . hydrocortisone (ANUSOL-HC) 25 MG suppository Place 1 suppository (25 mg total) rectally every 12 (twelve) hours. (Patient not taking: Reported on 11/24/2017) 12 suppository 1  . hydrocortisone (PROCTOZONE-HC) 2.5 % rectal cream Place rectally daily as needed. (Patient not taking: Reported on 11/24/2017) 30 g 1  . Lidocaine 2 % GEL Apply 1 application topically 2 (two) times daily. (Patient not taking: Reported on 11/24/2017) 1 Tube 1  . senna-docusate (SENOKOT-S) 8.6-50 MG tablet Take 1 tablet by mouth daily.    Marland Kitchen triamcinolone cream (KENALOG) 0.1 % triamcinolone acetonide 0.1 % topical cream     No current facility-administered medications on file prior to visit.    There are no Patient Instructions on file for this visit. No follow-ups on file.  Jennings Corado A Timtohy Broski, PA-C

## 2017-12-28 ENCOUNTER — Ambulatory Visit: Payer: PPO

## 2018-01-01 ENCOUNTER — Other Ambulatory Visit: Payer: Self-pay | Admitting: Internal Medicine

## 2018-01-01 ENCOUNTER — Ambulatory Visit (INDEPENDENT_AMBULATORY_CARE_PROVIDER_SITE_OTHER): Payer: PPO

## 2018-01-01 VITALS — BP 126/72 | HR 60 | Temp 98.2°F | Resp 15 | Ht 69.0 in | Wt 176.8 lb

## 2018-01-01 DIAGNOSIS — E7800 Pure hypercholesterolemia, unspecified: Secondary | ICD-10-CM

## 2018-01-01 DIAGNOSIS — Z23 Encounter for immunization: Secondary | ICD-10-CM | POA: Diagnosis not present

## 2018-01-01 DIAGNOSIS — Z0000 Encounter for general adult medical examination without abnormal findings: Secondary | ICD-10-CM | POA: Diagnosis not present

## 2018-01-01 NOTE — Progress Notes (Signed)
Subjective:   Kyle Meadows is a 72 y.o. male who presents for Medicare Annual/Subsequent preventive examination.  Review of Systems:  No ROS.  Medicare Wellness Visit. Additional risk factors are reflected in the social history.  Cardiac Risk Factors include: advanced age (>52men, >77 women);male gender     Objective:    Vitals: BP 126/72 (BP Location: Left Arm, Patient Position: Sitting, Cuff Size: Normal)   Pulse 60   Temp 98.2 F (36.8 C) (Oral)   Resp 15   Ht 5\' 9"  (1.753 m)   Wt 176 lb 12.8 oz (80.2 kg)   SpO2 94%   BMI 26.11 kg/m   Body mass index is 26.11 kg/m.  Advanced Directives 01/01/2018 07/03/2017 06/13/2017 12/27/2016 11/22/2016 05/06/2014 03/19/2014  Does Patient Have a Medical Advance Directive? Yes Yes Yes Yes Yes Yes Yes  Type of Paramedic of [place];Living will Living will Living will Living will;Healthcare Power of [place];Living will - [place]  Does patient want to make changes to medical advance directive? No - Patient declined - - No - Patient declined - - No - Patient declined  Copy of [place] in Chart? Yes - - Yes - Yes No - copy requested    Tobacco Social History   Tobacco Use  Smoking Status Never Smoker  Smokeless Tobacco Never Used     Counseling given: Not Answered   Clinical Intake:  Pre-visit preparation completed: Yes  Pain : No/denies pain     Nutritional Status: BMI 25 -29 Overweight Diabetes: No  How often do you need to have someone help you when you read instructions, pamphlets, or other written materials from your doctor or pharmacy?: 1 - Never  Interpreter Needed?: No     Past Medical History:  Diagnosis Date  . Arthritis   . Diverticulosis, sigmoid   . Environmental allergies   . Frequency of urination   . H/O sleep apnea    S/P  OSA surgery -- resolved  . Hemorrhoids   . History of BPH   . History of melanoma  excision    back  . History of nephritis    age 46  . History of squamous cell carcinoma excision    right arm, leg, face  . History of urinary retention    secondary bph with obstruction  . Melanoma ([place])   . Urethral stricture    Past Surgical History:  Procedure Laterality Date  . COLONOSCOPY WITH PROPOFOL N/A 06/13/2017   Procedure: COLONOSCOPY WITH PROPOFOL;  Surgeon: Lollie Sails, MD;  Location: [facility] ENDOSCOPY;  Service: Endoscopy;  Laterality: N/A;  . CYSTOSCOPY WITH RETROGRADE URETHROGRAM N/A 05/08/2014   Procedure: CYSTOSCOPY WITH RETROGRADE URETHROGRAM;  Surgeon: Alexis Frock, MD;  Location: [facility];  Service: Urology;  Laterality: N/A;  . CYSTOSCOPY WITH URETHRAL DILATATION N/A 05/08/2014   Procedure: CYSTOSCOPY WITH URETHRAL DILATATION;  Surgeon: Alexis Frock, MD;  [facility];  Service: Urology;  Laterality: N/A;  . KNEE ARTHROSCOPY W/ MENISCECTOMY Right x2   last one 2005  . LAPAROSCOPIC INGUINAL HERNIA REPAIR Right 2007 (approx)  . ORIF  RIGHT WRIST FX  2008   retained hardware  . ROBOT ASSISTED LAPAROSCOPIC RADICAL PROSTATECTOMY N/A 03/19/2014   Procedure: ROBOTIC ASSISTED LAPAROSCOPIC SIMPLE PROSTATECTOMY;  Surgeon: Alexis Frock, MD;  Location: [facility];  Service: Urology;  Laterality: N/A;  . TONSILLECTOMY  age 89  . UVULOPALATOPHARYNGOPLASTY  x5  last one 1995   Family History  Problem Relation Age of Onset  . Heart disease Father        CABG x 4  . Arthritis Father   . Glaucoma Father   . Hepatitis C Mother   . Thyroid disease Sister    Social History   Socioeconomic History  . Marital status: Married    Spouse name: Not on file  . Number of children: Not on file  . Years of education: Not on file  . Highest education level: Not on file  Occupational History  . Not on file  Social Needs  . Financial resource strain: Not hard at all  . Food insecurity:    Worry: Never true    Inability: Never true    . Transportation needs:    Medical: No    Non-medical: No  Tobacco Use  . Smoking status: Never Smoker  . Smokeless tobacco: Never Used  Substance and Sexual Activity  . Alcohol use: Yes    Alcohol/week: 0.0 standard drinks    Comment: rare  . Drug use: No  . Sexual activity: Not on file  Lifestyle  . Physical activity:    Days per week: Not on file    Minutes per session: Not on file  . Stress: Not on file  Relationships  . Social connections:    Talks on phone: Not on file    Gets together: Not on file    Attends religious service: Not on file    Active member of club or organization: Not on file    Attends meetings of clubs or organizations: Not on file    Relationship status: Not on file  Other Topics Concern  . Not on file  Social History Narrative  . Not on file    Outpatient Encounter Medications as of 01/01/2018  Medication Sig  . docusate sodium (COLACE) 100 MG capsule Take 1 capsule (100 mg total) by mouth 2 (two) times daily.  . fexofenadine (ALLEGRA) 180 MG tablet Take 1 tablet (180 mg total) by mouth daily as needed for allergies or rhinitis. (Patient taking differently: Take 180 mg by mouth every morning. )  . finasteride (PROSCAR) 5 MG tablet Take 5 mg by mouth daily.  Marland Kitchen glucosamine-chondroitin 500-400 MG tablet Take 1 tablet by mouth 2 (two) times daily.  . hydrocortisone (PROCTOZONE-HC) 2.5 % rectal cream Place rectally daily as needed.  . meloxicam (MOBIC) 15 MG tablet Take 15 mg by mouth 2 (two) times daily.   . Multiple Vitamins-Minerals (CENTRUM SILVER ADULT 50+ PO) Take 1 tablet by mouth daily.  Marland Kitchen oxybutynin (DITROPAN) 5 MG tablet TK 1 T PO Q 8 H PRF URINARY FREQUENCY OR URGENCY  . rosuvastatin (CRESTOR) 10 MG tablet Take 1 tablet (10 mg total) by mouth daily.  Marland Kitchen senna-docusate (SENOKOT-S) 8.6-50 MG tablet Take 1 tablet by mouth daily.  Marland Kitchen triamcinolone cream (KENALOG) 0.1 % triamcinolone acetonide 0.1 % topical cream  . [DISCONTINUED] Dermatological  Products, Misc. (SUVICORT) EMUL Apply topically.  . [DISCONTINUED] hydrocortisone (ANUSOL-HC) 25 MG suppository Place 1 suppository (25 mg total) rectally every 12 (twelve) hours.  . [DISCONTINUED] Lidocaine 2 % GEL Apply 1 application topically 2 (two) times daily.   No facility-administered encounter medications on file as of 01/01/2018.     Activities of Daily Living In your present state of health, do you have any difficulty performing the following activities: 01/01/2018  Hearing? N  Vision? N  Difficulty concentrating or making decisions? N  Walking or climbing stairs? N  Dressing or bathing? N  Doing errands, shopping? N  Preparing Food and eating ? N  Using the Toilet? N  In the past six months, have you accidently leaked urine? Y  Comment Followed by [facility],  Dr. Tammi Klippel. Managed with a brief at night only.   Do you have problems with loss of bowel control? N  Managing your Medications? N  Managing your Finances? N  Housekeeping or managing your Housekeeping? N  Some recent data might be hidden    Patient Care Team: Einar Pheasant, MD as PCP - General (Internal Medicine)   Assessment:   This is a routine wellness examination for [place].  The goal of the wellness visit is to assist the patient how to close the gaps in care and create a preventative care plan for the patient.   The roster of all physicians providing medical care to patient is listed in the Snapshot section of the chart.  Osteoporosis risk reviewed.    Safety issues reviewed; Smoke and carbon monoxide detectors in the home. No firearms in the home. Wears seatbelts when driving or riding with others. No violence in the home.  They do not have excessive sun exposure.  Discussed the need for sun protection: hats, long sleeves and the use of sunscreen if there is significant sun exposure.  Patient is alert, normal appearance, oriented to person/place/and time.  Correctly identified the president of  the [place] and recalls of 3/3 words. Performs simple calculations and can read correct time from watch face.  Displays appropriate judgement.  No new identified risk were noted.  No failures at ADL's or IADL's.    BMI- discussed the importance of a healthy diet, water intake and the benefits of aerobic exercise. He does have a healthy diet, adequate water intake.  Walks, golfs, bikes for exercise.   Dental- every 6 months.  Eye- Visual acuity not assessed per patient preference since they have regular follow up with the ophthalmologist.  Wears corrective lenses.  Sleep patterns- Sleeps well through the night. Nocturia. 1-2x.  Wears brief at night.   High dose influenza vaccine administered L deltoid, tolerated well. Educational material provided.  Health maintenance gaps- closed.  Patient Concerns: Confirm medical records sent to [facility] per his previous request.  Note sent to [place] to verify prior to his visit with pcp this week.   Exercise Activities and Dietary recommendations Current Exercise Habits: Home exercise routine, Type of exercise: walking;calisthenics, Time (Minutes): 60, Frequency (Times/Week): 2, Weekly Exercise (Minutes/Week): 120, Intensity: Moderate  Goals    . Increase physical activity     Strength training exercises       Fall Risk Fall Risk  01/01/2018 12/27/2016 06/09/2016 10/09/2012  Falls in the past year? No No No No   Depression Screen PHQ 2/9 Scores 01/01/2018 12/27/2016 06/09/2016 10/09/2012  PHQ - 2 Score 0 0 0 0    Cognitive Function MMSE - Mini Mental State Exam 01/01/2018 12/27/2016  Orientation to time 5 5  Orientation to Place 5 5  Registration 3 3  Attention/ Calculation 5 5  Recall 3 3  Language- name 2 objects 2 2  Language- repeat 1 1  Language- follow 3 step command 3 3  Language- read & follow direction 1 1  Write a sentence 1 1  Copy design 1 1  Total score 30 30        Immunization History  Administered Date(s) Administered  .  Influenza  Whole 01/28/2009, 02/09/2010, 01/24/2016  . Influenza, High Dose Seasonal PF 12/27/2016, 01/01/2018  . Influenza,inj,Quad PF,6+ Mos 01/28/2014  . Pneumococcal Conjugate-13 06/09/2016  . Pneumococcal Polysaccharide-23 04/16/2012  . Tdap 01/01/2004, 02/02/2016   Screening Tests Health Maintenance  Topic Date Due  . TETANUS/TDAP  02/01/2026  . COLONOSCOPY  06/14/2027  . INFLUENZA VACCINE  Completed  . Hepatitis C Screening  Completed  . PNA vac Low Risk Adult  Completed      Plan:    End of life planning; Advance aging; Advanced directives discussed. Copy of current HCPOA/Living Will on file.    I have personally reviewed and noted the following in the patient's chart:   . Medical and social history . Use of alcohol, tobacco or illicit drugs  . Current medications and supplements . Functional ability and status . Nutritional status . Physical activity . Advanced directives . List of other physicians . Hospitalizations, surgeries, and ER visits in previous 12 months . Vitals . Screenings to include cognitive, depression, and falls . Referrals and appointments  In addition, I have reviewed and discussed with patient certain preventive protocols, quality metrics, and best practice recommendations. A written personalized care plan for preventive services as well as general preventive health recommendations were provided to patient.     Varney Biles, LPN  06/24/1214   Reviewed above information.  Agree with assessment and plan.    Dr Nicki Reaper

## 2018-01-01 NOTE — Patient Instructions (Addendum)
  Kyle Meadows , Thank you for taking time to come for your Medicare Wellness Visit. I appreciate your ongoing commitment to your health goals. Please review the following plan we discussed and let me know if I can assist you in the future.   Keep all routine maintenance appointments.   These are the goals we discussed: Goals    . Increase physical activity     Strength training exercises       This is a list of the screening recommended for you and due dates:  Health Maintenance  Topic Date Due  . Tetanus Vaccine  02/01/2026  . Colon Cancer Screening  06/14/2027  . Flu Shot  Completed  .  Hepatitis C: One time screening is recommended by Center for Disease Control  (CDC) for  adults born from [birth year] through 1965.   Completed  . Pneumonia vaccines  Completed

## 2018-01-01 NOTE — Progress Notes (Signed)
Order placed for f/u labs.  

## 2018-01-02 ENCOUNTER — Telehealth: Payer: Self-pay

## 2018-01-02 NOTE — Telephone Encounter (Signed)
Called left message on voice mail, HIPAA compliant.  Fasting lab appointment made at 1030 on 01/03/18.  Drink plenty of water, take all scheduled medications, black coffee ok. No food, no coffee with cream or sugar. Minimum of 4 hours fasting time. If unable to make this appointment please call [phone].  OK for [facility] to read this message to patient.

## 2018-01-03 ENCOUNTER — Other Ambulatory Visit (INDEPENDENT_AMBULATORY_CARE_PROVIDER_SITE_OTHER): Payer: PPO

## 2018-01-03 DIAGNOSIS — E7800 Pure hypercholesterolemia, unspecified: Secondary | ICD-10-CM | POA: Diagnosis not present

## 2018-01-03 LAB — BASIC METABOLIC PANEL
BUN: 20 mg/dL (ref 6–23)
CHLORIDE: 105 meq/L (ref 96–112)
CO2: 33 meq/L — AB (ref 19–32)
Calcium: 9.1 mg/dL (ref 8.4–10.5)
Creatinine, Ser: 1.11 mg/dL (ref 0.40–1.50)
GFR: 69.2 mL/min (ref >60.00)
Glucose, Bld: 91 mg/dL (ref 70–99)
Potassium: 4.3 mEq/L (ref 3.5–5.1)
SODIUM: 141 meq/L (ref 135–145)

## 2018-01-03 LAB — LIPID PANEL
CHOL/HDL RATIO: 3
CHOLESTEROL: 139 mg/dL (ref 0–200)
HDL: 47.1 mg/dL (ref >39.00)
LDL CALC: 82 mg/dL (ref 0–99)
NonHDL: 91.81
TRIGLYCERIDES: 50 mg/dL (ref 0.0–149.0)
VLDL: 10 mg/dL (ref 0.0–40.0)

## 2018-01-03 LAB — HEPATIC FUNCTION PANEL
ALBUMIN: 4.2 g/dL (ref 3.5–5.2)
ALT: 14 U/L (ref 0–53)
AST: 17 U/L (ref 0–37)
Alkaline Phosphatase: 54 U/L (ref 39–117)
BILIRUBIN DIRECT: 0.1 mg/dL (ref 0.0–0.3)
TOTAL PROTEIN: 6.7 g/dL (ref 6.0–8.3)
Total Bilirubin: 0.4 mg/dL (ref 0.2–1.2)

## 2018-01-04 ENCOUNTER — Encounter: Payer: Self-pay | Admitting: Internal Medicine

## 2018-01-04 ENCOUNTER — Ambulatory Visit (INDEPENDENT_AMBULATORY_CARE_PROVIDER_SITE_OTHER): Payer: PPO | Admitting: Internal Medicine

## 2018-01-04 DIAGNOSIS — E7800 Pure hypercholesterolemia, unspecified: Secondary | ICD-10-CM

## 2018-01-04 DIAGNOSIS — I723 Aneurysm of iliac artery: Secondary | ICD-10-CM | POA: Diagnosis not present

## 2018-01-04 DIAGNOSIS — N400 Enlarged prostate without lower urinary tract symptoms: Secondary | ICD-10-CM | POA: Diagnosis not present

## 2018-01-04 DIAGNOSIS — Z9109 Other allergy status, other than to drugs and biological substances: Secondary | ICD-10-CM | POA: Diagnosis not present

## 2018-01-04 NOTE — Progress Notes (Signed)
[name], male   DOB: 01/12/1946, 72 y.o.   MRN: [id number]   Subjective:    [name], male    DOB: 04/19/1946, 72 y.o.   MRN: [id number]  HPI  Patient here for a scheduled follow up.  He reports he is doing well.  Has been seeing urology.  Last evaluated 11/23/17.  Worked up for hematuria.  Had Ct 10/2017 - unremarkable.  Cysto - 11/2017 - some partial recurrence prostate fossa tissue. Started on finasteride.  Stays active.  No chest pain.  No sob.  No acid reflux.  No abdominal pain.  Bowels moving.  Some nasal congestion.  Discussed use of saline nasal spray.     Past Medical History:  Diagnosis Date  . Arthritis   . Diverticulosis, sigmoid   . Environmental allergies   . Frequency of urination   . H/O sleep apnea    S/P  OSA surgery -- resolved  . Hemorrhoids   . History of BPH   . History of melanoma excision    back  . History of nephritis    age 52  . History of squamous cell carcinoma excision    right arm, leg, face  . History of urinary retention    secondary bph with obstruction  . Melanoma ([place])   . Urethral stricture    Past Surgical History:  Procedure Laterality Date  . COLONOSCOPY WITH PROPOFOL N/A 06/13/2017   Procedure: COLONOSCOPY WITH PROPOFOL;  Surgeon: Lollie Sails, MD;  [facility]: [facility] END[facility]COPY;  Service: Endoscopy;  Laterality: N/A;  . CYSTOSCOPY WITH RETROGRADE URETHROGRAM N/A 05/08/2014   Procedure: CYSTOSCOPY WITH RETROGRADE URETHROGRAM;  Surgeon: Alexis Frock, MD;  Location: [facility];  Service: Urology;  Laterality: N/A;  . CYSTOSCOPY WITH URETHRAL DILATATION N/A 05/08/2014   Procedure: CYSTOSCOPY WITH URETHRAL DILATATION;  Surgeon: Alexis Frock, MD;  Location[facility];  Service: Urology;  Laterality: N/A;  . KNEE ARTHROSCOPY W/ MENISCECTOMY Right x2   last one 2005  . LAPAROSCOPIC INGUINAL HERNIA REPAIR Right 2007 (approx)  . ORIF  RIGHT WRIST FX  2008   retained hardware    . ROBOT ASSISTED LAPAROSCOPIC RADICAL PROSTATECTOMY N/A 03/19/2014   Procedure: ROBOTIC ASSISTED LAPAROSCOPIC SIMPLE PROSTATECTOMY;  Surgeon: Alexis Frock, MD;  Location: [facility];  Service: Urology;  Laterality: N/A;  . TONSILLECTOMY  age 20  . UVULOPALATOPHARYNGOPLASTY  x5  last one 1995   Family History  Problem Relation Age of Onset  . Heart disease Father        CABG x 4  . Arthritis Father   . Glaucoma Father   . Hepatitis C Mother   . Thyroid disease Sister    Social History   Socioeconomic History  . Marital status: Married    Spouse name: Not on file  . Number of children: Not on file  . Years of education: Not on file  . Highest education level: Not on file  Occupational History  . Not on file  Social Needs  . Financial resource strain: Not hard at all  . Food insecurity:    Worry: Never true    Inability: Never true  . Transportation needs:    Medical: No    Non-medical: No  Tobacco Use  . Smoking status: Never Smoker  . Smokeless tobacco: Never Used  Substance and Sexual Activity  . Alcohol use: Yes    Alcohol/week: 0.0 standard drinks    Comment: rare  .  Drug use: No  . Sexual activity: Not on file  Lifestyle  . Physical activity:    Days per week: Not on file    Minutes per session: Not on file  . Stress: Not on file  Relationships  . Social connections:    Talks on phone: Not on file    Gets together: Not on file    Attends religious service: Not on file    Active member of club or organization: Not on file    Attends meetings of clubs or organizations: Not on file    Relationship status: Not on file  Other Topics Concern  . Not on file  Social History Narrative  . Not on file    Outpatient Encounter Medications as of 01/04/2018  Medication Sig  . docusate sodium (COLACE) 100 MG capsule Take 1 capsule (100 mg total) by mouth 2 (two) times daily.  . fexofenadine (ALLEGRA) 180 MG tablet Take 1 tablet (180 mg total) by mouth daily as needed  for allergies or rhinitis. (Patient taking differently: Take 180 mg by mouth every morning. )  . finasteride (PROSCAR) 5 MG tablet Take 5 mg by mouth daily.  Marland Kitchen glucosamine-chondroitin 500-400 MG tablet Take 1 tablet by mouth 2 (two) times daily.  . meloxicam (MOBIC) 15 MG tablet Take 15 mg by mouth 2 (two) times daily.   . Multiple Vitamins-Minerals (CENTRUM SILVER ADULT 50+ PO) Take 1 tablet by mouth daily.  Marland Kitchen oxybutynin (DITROPAN) 5 MG tablet TK 1 T PO Q 8 H PRF URINARY FREQUENCY OR URGENCY  . rosuvastatin (CRESTOR) 10 MG tablet Take 1 tablet (10 mg total) by mouth daily.  Marland Kitchen triamcinolone cream (KENALOG) 0.1 % triamcinolone acetonide 0.1 % topical cream  . [DISCONTINUED] hydrocortisone (PROCTOZONE-HC) 2.5 % rectal cream Place rectally daily as needed.  . [DISCONTINUED] senna-docusate (SENOKOT-S) 8.6-50 MG tablet Take 1 tablet by mouth daily.   No facility-administered encounter medications on file as of 01/04/2018.     Review of Systems  Constitutional: Negative for appetite change and unexpected weight change.  HENT: Negative for congestion and sinus pressure.   Respiratory: Negative for cough, chest tightness and shortness of breath.   Cardiovascular: Negative for chest pain, palpitations and leg swelling.  Gastrointestinal: Negative for abdominal pain, diarrhea, nausea and vomiting.  Genitourinary: Negative for difficulty urinating and dysuria.  Musculoskeletal: Negative for joint swelling and myalgias.       Some pain in his feet.  Support shoes help.    Skin: Negative for color change and rash.  Neurological: Negative for dizziness, light-headedness and headaches.  Psychiatric/Behavioral: Negative for agitation and dysphoric mood.       Objective:     Blood pressure rechecked by me:  122/72  Physical Exam  Constitutional: He appears well-developed and well-nourished. No distress.  HENT:  Nose: Nose normal.  Mouth/Throat: Oropharynx is clear and moist.  Neck: Neck supple.  No thyromegaly present.  Cardiovascular: Normal rate and regular rhythm.  Pulmonary/Chest: Effort normal and breath sounds normal. No respiratory distress.  Abdominal: Soft. Bowel sounds are normal. There is no tenderness.  Musculoskeletal: He exhibits no edema or tenderness.  Lymphadenopathy:    He has no cervical adenopathy.  Skin: No rash noted. No erythema.  Psychiatric: He has a normal mood and affect. His behavior is normal.    BP 118/60 (BP Location: Left Arm, Patient Position: Sitting, Cuff Size: Normal)   Pulse 63   Temp 97.6 F (36.4 C) (Oral)   Resp 18  Wt 177 lb 6.4 oz (80.5 kg)   SpO2 98%   BMI 26.20 kg/m  Wt Readings from Last 3 Encounters:  01/04/18 177 lb 6.4 oz (80.5 kg)  01/01/18 176 lb 12.8 oz (80.2 kg)  11/24/17 174 lb (78.9 kg)     Lab Results  Component Value Date   WBC 6.9 10/21/2017   HGB 15.0 10/21/2017   HCT 43.6 10/21/2017   PLT 223 10/21/2017   GLUCOSE 91 01/03/2018   CHOL 139 01/03/2018   TRIG 50.0 01/03/2018   HDL 47.10 01/03/2018   LDLCALC 82 01/03/2018   ALT 14 01/03/2018   AST 17 01/03/2018   NA 141 01/03/2018   K 4.3 01/03/2018   CL 105 01/03/2018   CREATININE 1.11 01/03/2018   BUN 20 01/03/2018   CO2 33 (H) 01/03/2018   TSH 1.11 08/23/2017       Assessment & Plan:   Problem List Items Addressed This Visit    Enlarged prostate    S/p robotic simple prostatectomy.  Just evaluated for hematuria. CT unremarkable.  Cystoscopy - partial recurrence prostate fossa tissue.  On finasteride.        Environmental allergies    Some nasal congestion.  Saline nasal spray as directed.       Hypercholesterolemia    On crestor.  Low cholesterol diet and exercise.  Follow lipid panel and liver function tests.        Iliac artery aneurysm ([facility])    Bilateral iliac artery aneurysm.  Stable.  F/u recommended in one year.            Einar Pheasant, MD

## 2018-01-07 ENCOUNTER — Encounter: Payer: Self-pay | Admitting: Internal Medicine

## 2018-01-07 NOTE — Assessment & Plan Note (Signed)
Some nasal congestion.  Saline nasal spray as directed.

## 2018-01-07 NOTE — Assessment & Plan Note (Signed)
On crestor.  Low cholesterol diet and exercise.  Follow lipid panel and liver function tests.   

## 2018-01-07 NOTE — Assessment & Plan Note (Signed)
Bilateral iliac artery aneurysm.  Stable.  F/u recommended in one year.

## 2018-01-07 NOTE — Assessment & Plan Note (Signed)
S/p robotic simple prostatectomy.  Just evaluated for hematuria. CT unremarkable.  Cystoscopy - partial recurrence prostate fossa tissue.  On finasteride.

## 2018-05-02 DIAGNOSIS — C44619 Basal cell carcinoma of skin of left upper limb, including shoulder: Secondary | ICD-10-CM | POA: Diagnosis not present

## 2018-05-02 DIAGNOSIS — L570 Actinic keratosis: Secondary | ICD-10-CM | POA: Diagnosis not present

## 2018-05-02 DIAGNOSIS — Z85828 Personal history of other malignant neoplasm of skin: Secondary | ICD-10-CM | POA: Diagnosis not present

## 2018-05-02 DIAGNOSIS — Z85820 Personal history of malignant melanoma of skin: Secondary | ICD-10-CM | POA: Diagnosis not present

## 2018-05-02 DIAGNOSIS — D2272 Melanocytic nevi of left lower limb, including hip: Secondary | ICD-10-CM | POA: Diagnosis not present

## 2018-05-02 DIAGNOSIS — L111 Transient acantholytic dermatosis [Grover]: Secondary | ICD-10-CM | POA: Diagnosis not present

## 2018-05-02 DIAGNOSIS — D2261 Melanocytic nevi of right upper limb, including shoulder: Secondary | ICD-10-CM | POA: Diagnosis not present

## 2018-05-02 DIAGNOSIS — X32XXXA Exposure to sunlight, initial encounter: Secondary | ICD-10-CM | POA: Diagnosis not present

## 2018-05-02 DIAGNOSIS — D485 Neoplasm of uncertain behavior of skin: Secondary | ICD-10-CM | POA: Diagnosis not present

## 2018-05-31 ENCOUNTER — Other Ambulatory Visit: Payer: Self-pay | Admitting: Internal Medicine

## 2018-06-21 DIAGNOSIS — C44619 Basal cell carcinoma of skin of left upper limb, including shoulder: Secondary | ICD-10-CM | POA: Diagnosis not present

## 2018-07-03 ENCOUNTER — Other Ambulatory Visit: Payer: PPO

## 2018-07-05 ENCOUNTER — Encounter: Payer: PPO | Admitting: Internal Medicine

## 2018-10-01 ENCOUNTER — Encounter: Payer: Self-pay | Admitting: Podiatry

## 2018-10-01 ENCOUNTER — Other Ambulatory Visit: Payer: Self-pay

## 2018-10-01 ENCOUNTER — Ambulatory Visit: Payer: PPO | Admitting: Podiatry

## 2018-10-01 VITALS — Temp 96.7°F

## 2018-10-01 DIAGNOSIS — M5412 Radiculopathy, cervical region: Secondary | ICD-10-CM | POA: Insufficient documentation

## 2018-10-01 DIAGNOSIS — L603 Nail dystrophy: Secondary | ICD-10-CM | POA: Diagnosis not present

## 2018-10-01 DIAGNOSIS — M7650 Patellar tendinitis, unspecified knee: Secondary | ICD-10-CM | POA: Insufficient documentation

## 2018-10-01 DIAGNOSIS — B351 Tinea unguium: Secondary | ICD-10-CM | POA: Diagnosis not present

## 2018-10-01 NOTE — Addendum Note (Signed)
Addended by: Clovis Riley E on: 10/01/2018 01:06 PM   Modules accepted: Orders, Level of Service

## 2018-10-01 NOTE — Progress Notes (Signed)
He presents today concerned about discoloration medial border hallux left.  States is been discolored and thick for the past several months medicine seems to be working worsening over the past couple of months he has done nothing to it and there is been no treatment.  Objective: Vital signs are stable he is alert oriented x3.  Pulses are palpable.  Physical exam does demonstrate a thickening discolored flaky nail along the medial border which was easily debrided on today for samples.  No purulence no malodor no signs of infection.  Assessment: Probable onychomycosis cannot rule out nail dystrophy.  Plan: Samples of the skin and nail were taken today to be sent for pathologic evaluation.

## 2018-11-02 ENCOUNTER — Telehealth: Payer: Self-pay | Admitting: *Deleted

## 2018-11-02 DIAGNOSIS — E7800 Pure hypercholesterolemia, unspecified: Secondary | ICD-10-CM

## 2018-11-02 NOTE — Telephone Encounter (Signed)
Please place future orders for lab appt.  

## 2018-11-03 NOTE — Telephone Encounter (Signed)
Orders placed for f/u labs.  

## 2018-11-07 ENCOUNTER — Other Ambulatory Visit: Payer: Self-pay

## 2018-11-07 ENCOUNTER — Encounter: Payer: Self-pay | Admitting: Podiatry

## 2018-11-07 ENCOUNTER — Ambulatory Visit: Payer: PPO | Admitting: Podiatry

## 2018-11-07 VITALS — Temp 97.2°F

## 2018-11-07 DIAGNOSIS — L603 Nail dystrophy: Secondary | ICD-10-CM | POA: Diagnosis not present

## 2018-11-07 MED ORDER — TERBINAFINE HCL 250 MG PO TABS: 250.0000 mg | ORAL_TABLET | Freq: Every day | ORAL | 0 refills | Status: DC

## 2018-11-07 NOTE — Progress Notes (Signed)
He presents today for follow-up of his nail fungus.  He states no change.  Objective: Vital signs are stable he is alert and oriented x3.  Pulses are palpable.  Pathology report does demonstrate onychomycosis.  Saprophytic fungus.  Assessment: Onychomycosis.  Plan: After thorough discussion of pros and cons of oral medication as well as alternative therapy such as topicals and laser therapy he has chosen oral medication.  We discussed the pros and cons of this in great detail the possible side effects and contraindications.  At this point he will be having blood work done next week from his primary care provider I will make a note to check his blood work next week make sure his liver profile is good.  In the meantime I will go ahead and write a prescription for his Lamisil 250 mg tablets 1 p.o. daily.  I will follow-up with him in 1 month to 6 weeks.  At which time another liver profile will be performed as well as another prescription.

## 2018-11-08 ENCOUNTER — Other Ambulatory Visit: Payer: Self-pay

## 2018-11-08 ENCOUNTER — Other Ambulatory Visit (INDEPENDENT_AMBULATORY_CARE_PROVIDER_SITE_OTHER): Payer: PPO

## 2018-11-08 DIAGNOSIS — E7800 Pure hypercholesterolemia, unspecified: Secondary | ICD-10-CM

## 2018-11-08 LAB — BASIC METABOLIC PANEL
BUN: 27 mg/dL — ABNORMAL HIGH (ref 6–23)
CO2: 31 mEq/L (ref 19–32)
Calcium: 9.2 mg/dL (ref 8.4–10.5)
Chloride: 104 mEq/L (ref 96–112)
Creatinine, Ser: 0.97 mg/dL (ref 0.40–1.50)
GFR: 75.89 mL/min (ref >60.00)
Glucose, Bld: 90 mg/dL (ref 70–99)
Potassium: 3.9 mEq/L (ref 3.5–5.1)
Sodium: 141 mEq/L (ref 135–145)

## 2018-11-08 LAB — CBC WITH DIFFERENTIAL/PLATELET
Basophils Absolute: 0.1 10*3/uL (ref 0.0–0.1)
Basophils Relative: 1 % (ref 0.0–3.0)
Eosinophils Absolute: 0.2 10*3/uL (ref 0.0–0.7)
Eosinophils Relative: 3.3 % (ref 0.0–5.0)
HCT: 43.5 % (ref 39.0–52.0)
Hemoglobin: 14.7 g/dL (ref 13.0–17.0)
Lymphocytes Relative: 27.1 % (ref 12.0–46.0)
Lymphs Abs: 1.9 10*3/uL (ref 0.7–4.0)
MCHC: 33.8 g/dL (ref 30.0–36.0)
MCV: 92.1 fl (ref 78.0–100.0)
Monocytes Absolute: 0.4 10*3/uL (ref 0.1–1.0)
Monocytes Relative: 5.8 % (ref 3.0–12.0)
Neutro Abs: 4.5 10*3/uL (ref 1.4–7.7)
Neutrophils Relative %: 62.8 % (ref 43.0–77.0)
Platelets: 229 10*3/uL (ref 150.0–400.0)
RBC: 4.73 Mil/uL (ref 4.22–5.81)
RDW: 13.3 % (ref 11.5–15.5)
WBC: 7.1 10*3/uL (ref 4.0–10.5)

## 2018-11-08 LAB — HEPATIC FUNCTION PANEL
ALT: 16 U/L (ref 0–53)
AST: 17 U/L (ref 0–37)
Albumin: 4.6 g/dL (ref 3.5–5.2)
Alkaline Phosphatase: 56 U/L (ref 39–117)
Bilirubin, Direct: 0.1 mg/dL (ref 0.0–0.3)
Total Bilirubin: 0.4 mg/dL (ref 0.2–1.2)
Total Protein: 6.7 g/dL (ref 6.0–8.3)

## 2018-11-08 LAB — LIPID PANEL
Cholesterol: 161 mg/dL (ref 0–200)
HDL: 41.4 mg/dL (ref >39.00)
LDL Cholesterol: 100 mg/dL — ABNORMAL HIGH (ref 0–99)
NonHDL: 119.69
Total CHOL/HDL Ratio: 4
Triglycerides: 97 mg/dL (ref 0.0–149.0)
VLDL: 19.4 mg/dL (ref 0.0–40.0)

## 2018-11-08 LAB — TSH: TSH: 1.74 u[IU]/mL (ref 0.35–4.50)

## 2018-11-13 ENCOUNTER — Ambulatory Visit (INDEPENDENT_AMBULATORY_CARE_PROVIDER_SITE_OTHER): Payer: PPO | Admitting: Internal Medicine

## 2018-11-13 ENCOUNTER — Encounter: Payer: Self-pay | Admitting: Internal Medicine

## 2018-11-13 ENCOUNTER — Other Ambulatory Visit: Payer: Self-pay

## 2018-11-13 VITALS — BP 122/70 | HR 65 | Temp 98.5°F | Resp 16 | Ht 69.0 in | Wt 181.0 lb

## 2018-11-13 DIAGNOSIS — E7800 Pure hypercholesterolemia, unspecified: Secondary | ICD-10-CM | POA: Diagnosis not present

## 2018-11-13 DIAGNOSIS — I723 Aneurysm of iliac artery: Secondary | ICD-10-CM

## 2018-11-13 DIAGNOSIS — Z0000 Encounter for general adult medical examination without abnormal findings: Secondary | ICD-10-CM | POA: Diagnosis not present

## 2018-11-13 DIAGNOSIS — N400 Enlarged prostate without lower urinary tract symptoms: Secondary | ICD-10-CM | POA: Diagnosis not present

## 2018-11-13 DIAGNOSIS — R42 Dizziness and giddiness: Secondary | ICD-10-CM | POA: Diagnosis not present

## 2018-11-13 NOTE — Progress Notes (Signed)
[name], male   DOB: 01/06/1946, 73 y.o.   MRN: [id number]   Subjective:    [name], male    DOB: 1945-12-28, 73 y.o.   MRN: [id number]  HPI  Patient here for his physical exam.  States he is doing relatively well.  Trying to stay active.  Sees urology.  On finasteride.  Does report some occasional light headedness if he gets up too fast.  No headache.  No dizziness.  No chest pain. No sob. No acid reflux.  No abdominal pain.  Bowels moving.  On crestor.  Discussed lab results.     Past Medical History:  Diagnosis Date  . Arthritis   . Diverticulosis, sigmoid   . Environmental allergies   . Frequency of urination   . H/O sleep apnea    S/P  OSA surgery -- resolved  . Hemorrhoids   . History of BPH   . History of melanoma excision    back  . History of nephritis    age 74  . History of squamous cell carcinoma excision    right arm, leg, face  . History of urinary retention    secondary bph with obstruction  . Melanoma ([place])   . Urethral stricture    Past Surgical History:  Procedure Laterality Date  . COLONOSCOPY WITH PROPOFOL N/A 06/13/2017   Procedure: COLONOSCOPY WITH PROPOFOL;  Surgeon: Lollie Sails, MD;  Location: [facility]COPY;  Service: Endoscopy;  Laterality: N/A;  . CYSTOSCOPY WITH RETROGRADE URETHROGRAM N/A 05/08/2014   Procedure: CYSTOSCOPY WITH RETROGRADE URETHROGRAM;  Surgeon: Alexis Frock, MD;  Location[facility];  Service: Urology;  Laterality: N/A;  . CYSTOSCOPY WITH URETHRAL DILATATION N/A 05/08/2014   Procedure: CYSTOSCOPY WITH URETHRAL DILATATION;  Surgeon: Alexis Frock, MD;  Location: [facility];  Service: Urology;  Laterality: N/A;  . KNEE ARTHROSCOPY W/ MENISCECTOMY Right x2   last one 2005  . LAPAROSCOPIC INGUINAL HERNIA REPAIR Right 2007 (approx)  . ORIF  RIGHT WRIST FX  2008   retained hardware  . ROBOT ASSISTED LAPAROSCOPIC RADICAL PROSTATECTOMY N/A 03/19/2014   Procedure:  ROBOTIC ASSISTED LAPAROSCOPIC SIMPLE PROSTATECTOMY;  Surgeon: Alexis Frock, MD;  Location: [facility];  Service: Urology;  Laterality: N/A;  . TONSILLECTOMY  age 53  . UVULOPALATOPHARYNGOPLASTY  x5  last one 1995   Family History  Problem Relation Age of Onset  . Heart disease Father        CABG x 4  . Arthritis Father   . Glaucoma Father   . Hepatitis C Mother   . Thyroid disease Sister    Social History   Socioeconomic History  . Marital status: Married    Spouse name: Not on file  . Number of children: Not on file  . Years of education: Not on file  . Highest education level: Not on file  Occupational History  . Not on file  Social Needs  . Financial resource strain: Not hard at all  . Food insecurity    Worry: Never true    Inability: Never true  . Transportation needs    Medical: No    Non-medical: No  Tobacco Use  . Smoking status: Never Smoker  . Smokeless tobacco: Never Used  Substance and Sexual Activity  . Alcohol use: Yes    Alcohol/week: 0.0 standard drinks    Comment: rare  . Drug use: No  . Sexual activity: Not on file  Lifestyle  .  Physical activity    Days per week: Not on file    Minutes per session: Not on file  . Stress: Not on file  Relationships  . Social Herbalist on phone: Not on file    Gets together: Not on file    Attends religious service: Not on file    Active member of club or organization: Not on file    Attends meetings of clubs or organizations: Not on file    Relationship status: Not on file  Other Topics Concern  . Not on file  Social History Narrative  . Not on file    Outpatient Encounter Medications as of 11/13/2018  Medication Sig  . Boswellia-Glucosamine-Vit D (OSTEO BI-FLEX ONE PER DAY PO) Take by mouth.  . Coenzyme Q10 (COQ10 PO) Take by mouth.  . fexofenadine (ALLEGRA) 180 MG tablet Take 1 tablet (180 mg total) by mouth daily as needed for allergies or rhinitis. (Patient taking differently: Take 180 mg by  mouth every morning. )  . finasteride (PROSCAR) 5 MG tablet Take 5 mg by mouth daily.  . meloxicam (MOBIC) 15 MG tablet Take 15 mg by mouth 2 (two) times daily.   . meloxicam (MOBIC) 7.5 MG tablet   . Multiple Vitamins-Minerals (CENTRUM SILVER ADULT 50+ PO) Take 1 tablet by mouth daily.  . Omega-3 Fatty Acids (FISH OIL PO) Take by mouth.  . oxybutynin (DITROPAN) 5 MG tablet TK 1 T PO Q 8 H PRF URINARY FREQUENCY OR URGENCY  . rosuvastatin (CRESTOR) 10 MG tablet TAKE ONE TABLET EVERY DAY  . terbinafine (LAMISIL) 250 MG tablet Take 1 tablet (250 mg total) by mouth daily.   No facility-administered encounter medications on file as of 11/13/2018.     Review of Systems  Constitutional: Negative for appetite change and unexpected weight change.  HENT: Negative for congestion and sinus pressure.   Eyes: Negative for pain and visual disturbance.  Respiratory: Negative for cough, chest tightness and shortness of breath.   Cardiovascular: Negative for chest pain, palpitations and leg swelling.  Gastrointestinal: Negative for abdominal pain, diarrhea, nausea and vomiting.  Genitourinary: Negative for difficulty urinating and dysuria.  Musculoskeletal: Negative for joint swelling and myalgias.  Skin: Negative for color change and rash.  Neurological: Positive for light-headedness. Negative for dizziness and headaches.  Hematological: Negative for adenopathy. Does not bruise/bleed easily.  Psychiatric/Behavioral: Negative for agitation and dysphoric mood.       Objective:    Physical Exam Constitutional:      General: He is not in acute distress.    Appearance: Normal appearance. He is well-developed.  HENT:     Head: Normocephalic and atraumatic.     Right Ear: External ear normal.     Left Ear: External ear normal.  Eyes:     General:        Right eye: No discharge.        Left eye: No discharge.     Conjunctiva/sclera: Conjunctivae normal.  Neck:     Musculoskeletal: Neck supple. No  muscular tenderness.     Thyroid: No thyromegaly.  Cardiovascular:     Rate and Rhythm: Normal rate and regular rhythm.  Pulmonary:     Effort: No respiratory distress.     Breath sounds: Normal breath sounds. No wheezing.  Abdominal:     General: Bowel sounds are normal.     Palpations: Abdomen is soft.     Tenderness: There is no abdominal tenderness.  Genitourinary:  Comments: Not performed.  Musculoskeletal:        General: No swelling or tenderness.  Lymphadenopathy:     Cervical: No cervical adenopathy.  Skin:    Findings: No erythema or rash.  Neurological:     Mental Status: He is alert and oriented to person, place, and time.  Psychiatric:        Mood and Affect: Mood normal.        Behavior: Behavior normal.     BP 122/70   Pulse 65   Temp 98.5 F (36.9 C) (Oral)   Resp 16   Ht 5\' 9"  (1.753 m)   Wt 181 lb (82.1 kg)   SpO2 97%   BMI 26.73 kg/m  Wt Readings from Last 3 Encounters:  11/13/18 181 lb (82.1 kg)  01/04/18 177 lb 6.4 oz (80.5 kg)  01/01/18 176 lb 12.8 oz (80.2 kg)     Lab Results  Component Value Date   WBC 7.1 11/08/2018   HGB 14.7 11/08/2018   HCT 43.5 11/08/2018   PLT 229.0 11/08/2018   GLUCOSE 90 11/08/2018   CHOL 161 11/08/2018   TRIG 97.0 11/08/2018   HDL 41.40 11/08/2018   LDLCALC 100 (H) 11/08/2018   ALT 16 11/08/2018   AST 17 11/08/2018   NA 141 11/08/2018   K 3.9 11/08/2018   CL 104 11/08/2018   CREATININE 0.97 11/08/2018   BUN 27 (H) 11/08/2018   CO2 31 11/08/2018   TSH 1.74 11/08/2018       Assessment & Plan:   Problem List Items Addressed This Visit    Enlarged prostate    S/p robotic simple prostatectomy.  Evaluated for hematuria.  CT unremarkable.  Cystoscopy - partial recurrence prostate fossa tissue. On finasteride.  Continue follow up with urology.        Health care maintenance    Physical today 11/13/18.  Urology follows psa.  Colonoscopy 06/13/17.  Recommended f/u in 5 years.         Hypercholesterolemia    On crestor.  Low cholesterol diet and exercise.  Follow lipid panel and liver function tests.        Relevant Orders   Hepatic function panel   Lipid panel   Basic metabolic panel   Iliac artery aneurysm (HCC)    Bilateral iliac artery aneurysm.  Stable.  F/u in one year - evaluated - [facility].  Last evaluated 12/12/17.        Light headedness    Not orthostatic on exam.  Discussed slow position changes and movements.  Discussed further evaluation.  Wants to follow.            Einar Pheasant, MD

## 2018-11-13 NOTE — Assessment & Plan Note (Signed)
Physical today 11/13/18.  Urology follows psa.  Colonoscopy 06/13/17.  Recommended f/u in 5 years.

## 2018-11-17 ENCOUNTER — Encounter: Payer: Self-pay | Admitting: Internal Medicine

## 2018-11-17 DIAGNOSIS — R42 Dizziness and giddiness: Secondary | ICD-10-CM | POA: Insufficient documentation

## 2018-11-17 NOTE — Assessment & Plan Note (Signed)
Not orthostatic on exam.  Discussed slow position changes and movements.  Discussed further evaluation.  Wants to follow.

## 2018-11-17 NOTE — Assessment & Plan Note (Signed)
On crestor.  Low cholesterol diet and exercise.  Follow lipid panel and liver function tests.   

## 2018-11-17 NOTE — Assessment & Plan Note (Signed)
S/p robotic simple prostatectomy.  Evaluated for hematuria.  CT unremarkable.  Cystoscopy - partial recurrence prostate fossa tissue. On finasteride.  Continue follow up with urology.

## 2018-11-17 NOTE — Assessment & Plan Note (Signed)
Bilateral iliac artery aneurysm.  Stable.  F/u in one year - evaluated - [facility].  Last evaluated 12/12/17.

## 2018-11-27 ENCOUNTER — Ambulatory Visit (INDEPENDENT_AMBULATORY_CARE_PROVIDER_SITE_OTHER): Payer: PPO | Admitting: Vascular Surgery

## 2018-11-27 ENCOUNTER — Encounter (INDEPENDENT_AMBULATORY_CARE_PROVIDER_SITE_OTHER): Payer: PPO

## 2018-12-07 ENCOUNTER — Telehealth: Payer: Self-pay

## 2018-12-07 NOTE — Telephone Encounter (Signed)
Patient called asking if he needs a refill on Lamisil. He will run out of medication today and his next appointment in not until 12/26/2018. I spoke to patient and advised that Dr. March Rummage advised against a refill, that the patient will need liver function tests; but that I would forward this message to Dr. Milinda Pointer.

## 2018-12-12 NOTE — Telephone Encounter (Signed)
No I will see him next month first.

## 2018-12-19 ENCOUNTER — Ambulatory Visit: Payer: PPO | Admitting: Podiatry

## 2018-12-26 ENCOUNTER — Other Ambulatory Visit: Payer: Self-pay

## 2018-12-26 ENCOUNTER — Encounter: Payer: Self-pay | Admitting: Podiatry

## 2018-12-26 ENCOUNTER — Ambulatory Visit: Payer: PPO | Admitting: Podiatry

## 2018-12-26 DIAGNOSIS — Z79899 Other long term (current) drug therapy: Secondary | ICD-10-CM | POA: Diagnosis not present

## 2018-12-26 DIAGNOSIS — L603 Nail dystrophy: Secondary | ICD-10-CM | POA: Diagnosis not present

## 2018-12-26 MED ORDER — TERBINAFINE HCL 250 MG PO TABS: 250.0000 mg | ORAL_TABLET | Freq: Every day | ORAL | 0 refills | Status: DC

## 2018-12-26 NOTE — Progress Notes (Signed)
He presents today for follow-up of his pathology.  He has taken 30 days of Lamisil states that there is no change yet.  He denies fever chills nausea vomiting muscle aches and pains that he can relate to the use of the medication.  Objective: No change in physical exam.  Assessment: Treatment with Lamisil for onychomycosis.  Plan: Request another liver profile should this come back abnormal we will notify him immediately otherwise I will go ahead and prescribe another 90 days worth of medication.  Follow-up with him in 4 months

## 2018-12-27 DIAGNOSIS — Z79899 Other long term (current) drug therapy: Secondary | ICD-10-CM | POA: Diagnosis not present

## 2018-12-28 LAB — HEPATIC FUNCTION PANEL
ALT: 16 IU/L (ref 0–44)
AST: 21 IU/L (ref 0–40)
Albumin: 4.9 g/dL — ABNORMAL HIGH (ref 3.7–4.7)
Alkaline Phosphatase: 57 IU/L (ref 39–117)
Bilirubin Total: 0.4 mg/dL (ref 0.0–1.2)
Bilirubin, Direct: 0.12 mg/dL (ref 0.00–0.40)
Total Protein: 6.9 g/dL (ref 6.0–8.5)

## 2019-01-03 ENCOUNTER — Telehealth: Payer: Self-pay

## 2019-01-03 NOTE — Telephone Encounter (Signed)
-----   Message from Garrel Ridgel, [place] sent at 01/02/2019  7:06 AM EDT ----- Blood work looks perfect and may continue with medication.

## 2019-01-03 NOTE — Telephone Encounter (Signed)
Patient has been notified of lab results and instructed to continue medication as directed per Dr. Milinda Pointer.

## 2019-01-08 ENCOUNTER — Ambulatory Visit (INDEPENDENT_AMBULATORY_CARE_PROVIDER_SITE_OTHER): Payer: PPO

## 2019-01-08 ENCOUNTER — Other Ambulatory Visit: Payer: Self-pay

## 2019-01-08 ENCOUNTER — Ambulatory Visit: Payer: PPO | Admitting: Internal Medicine

## 2019-01-08 DIAGNOSIS — Z0000 Encounter for general adult medical examination without abnormal findings: Secondary | ICD-10-CM | POA: Diagnosis not present

## 2019-01-08 DIAGNOSIS — R3915 Urgency of urination: Secondary | ICD-10-CM | POA: Diagnosis not present

## 2019-01-08 DIAGNOSIS — R310 Gross hematuria: Secondary | ICD-10-CM | POA: Diagnosis not present

## 2019-01-08 DIAGNOSIS — R3912 Poor urinary stream: Secondary | ICD-10-CM | POA: Diagnosis not present

## 2019-01-08 NOTE — Progress Notes (Addendum)
Subjective:   Kyle Meadows is a 73 y.o. male who presents for Medicare Annual/Subsequent preventive examination.  Review of Systems:  No ROS.  Medicare Wellness Virtual Visit.  Visual/audio telehealth visit, UTA vital signs.   See social history for additional risk factors.   Cardiac Risk Factors include: advanced age (>89men, >59 women);male gender     Objective:    Vitals: There were no vitals taken for this visit.  There is no height or weight on file to calculate BMI.  Advanced Directives 01/08/2019 01/01/2018 07/03/2017 06/13/2017 12/27/2016 11/22/2016 05/06/2014  Does Patient Have a Medical Advance Directive? Yes Yes Yes Yes Yes Yes Yes  Type of Paramedic of [place];Living will [place];Living will Living will Living will Living will;Healthcare Power of [place];Living will -  Does patient want to make changes to medical advance directive? No - Patient declined No - Patient declined - - No - Patient declined - -  Copy of [place] in Chart? Yes - validated most recent copy scanned in chart (See row information) Yes - - Yes - Yes    Tobacco Social History   Tobacco Use  Smoking Status Never Smoker  Smokeless Tobacco Never Used     Counseling given: Not Answered   Clinical Intake:  Pre-visit preparation completed: Yes        Diabetes: No  How often do you need to have someone help you when you read instructions, pamphlets, or other written materials from your doctor or pharmacy?: 1 - Never  Interpreter Needed?: No     Past Medical History:  Diagnosis Date  . Arthritis   . Diverticulosis, sigmoid   . Environmental allergies   . Frequency of urination   . H/O sleep apnea    S/P  OSA surgery -- resolved  . Hemorrhoids   . History of BPH   . History of melanoma excision    back  . History of nephritis    age 36  . History of squamous cell carcinoma excision    right arm, leg, face  . History of urinary retention    secondary bph with obstruction  . Melanoma ([place])   . Urethral stricture    Past Surgical History:  Procedure Laterality Date  . COLONOSCOPY WITH PROPOFOL N/A 06/13/2017   Procedure: COLONOSCOPY WITH PROPOFOL;  Surgeon: Lollie Sails, MD;  Location: [facility] ENDOSCOPY;  Service: Endoscopy;  Laterality: N/A;  . CYSTOSCOPY WITH RETROGRADE URETHROGRAM N/A 05/08/2014   Procedure: CYSTOSCOPY WITH RETROGRADE URETHROGRAM;  Surgeon: Alexis Frock, MD;  Location: [facility];  Service: Urology;  Laterality: N/A;  . CYSTOSCOPY WITH URETHRAL DILATATION N/A 05/08/2014   Procedure: CYSTOSCOPY WITH URETHRAL DILATATION;  Surgeon: Alexis Frock, MD;  Location: [facility];  Service: Urology;  Laterality: N/A;  . KNEE ARTHROSCOPY W/ MENISCECTOMY Right x2   last one 2005  . LAPAROSCOPIC INGUINAL HERNIA REPAIR Right 2007 (approx)  . ORIF  RIGHT WRIST FX  2008   retained hardware  . ROBOT ASSISTED LAPAROSCOPIC RADICAL PROSTATECTOMY N/A 03/19/2014   Procedure: ROBOTIC ASSISTED LAPAROSCOPIC SIMPLE PROSTATECTOMY;  Surgeon: Alexis Frock, MD;  Location: [facility];  Service: Urology;  Laterality: N/A;  . TONSILLECTOMY  age 59  . UVULOPALATOPHARYNGOPLASTY  x5  last one 1995   Family History  Problem Relation Age of Onset  . Heart disease Father        CABG x 4  . Arthritis Father   .  Glaucoma Father   . Hepatitis C Mother   . Thyroid disease Sister    Social History   Socioeconomic History  . Marital status: Married    Spouse name: Not on file  . Number of children: Not on file  . Years of education: Not on file  . Highest education level: Not on file  Occupational History  . Not on file  Social Needs  . Financial resource strain: Not hard at all  . Food insecurity    Worry: Never true    Inability: Never true  . Transportation needs    Medical: No    Non-medical: No  Tobacco Use  . Smoking status: Never Smoker   . Smokeless tobacco: Never Used  Substance and Sexual Activity  . Alcohol use: Yes    Alcohol/week: 0.0 standard drinks    Comment: rare  . Drug use: No  . Sexual activity: Not on file  Lifestyle  . Physical activity    Days per week: Not on file    Minutes per session: Not on file  . Stress: Not on file  Relationships  . Social Herbalist on phone: Not on file    Gets together: Not on file    Attends religious service: Not on file    Active member of club or organization: Not on file    Attends meetings of clubs or organizations: Not on file    Relationship status: Not on file  Other Topics Concern  . Not on file  Social History Narrative  . Not on file    Outpatient Encounter Medications as of 01/08/2019  Medication Sig  . Boswellia-Glucosamine-Vit D (OSTEO BI-FLEX ONE PER DAY PO) Take by mouth.  . Coenzyme Q10 (COQ10 PO) Take by mouth.  . fexofenadine (ALLEGRA) 180 MG tablet Take 1 tablet (180 mg total) by mouth daily as needed for allergies or rhinitis. (Patient taking differently: Take 180 mg by mouth every morning. )  . finasteride (PROSCAR) 5 MG tablet Take 5 mg by mouth daily.  . meloxicam (MOBIC) 15 MG tablet Take 15 mg by mouth 2 (two) times daily.   . meloxicam (MOBIC) 7.5 MG tablet   . Multiple Vitamins-Minerals (CENTRUM SILVER ADULT 50+ PO) Take 1 tablet by mouth daily.  . Omega-3 Fatty Acids (FISH OIL PO) Take by mouth.  . oxybutynin (DITROPAN) 5 MG tablet TK 1 T PO Q 8 H PRF URINARY FREQUENCY OR URGENCY  . rosuvastatin (CRESTOR) 10 MG tablet TAKE ONE TABLET EVERY DAY  . terbinafine (LAMISIL) 250 MG tablet Take 1 tablet (250 mg total) by mouth daily.   No facility-administered encounter medications on file as of 01/08/2019.     Activities of Daily Living In your present state of health, do you have any difficulty performing the following activities: 01/08/2019  Hearing? N  Vision? N  Difficulty concentrating or making decisions? N  Walking or  climbing stairs? N  Dressing or bathing? N  Doing errands, shopping? N  Preparing Food and eating ? N  Using the Toilet? N  In the past six months, have you accidently leaked urine? N  Do you have problems with loss of bowel control? N  Managing your Medications? N  Managing your Finances? N  Housekeeping or managing your Housekeeping? N  Some recent data might be hidden    Patient Care Team: Einar Pheasant, MD as PCP - General (Internal Medicine)   Assessment:   This is a routine wellness examination  for Kyle Meadows.  I connected with patient 01/08/19 at  8:30 AM EDT by an audio enabled telemedicine application and verified that I am speaking with the correct person using two identifiers. Patient stated full name and DOB. Patient gave permission to continue with virtual visit. Patient's location was at home and Nurse's location was at [place] office.   Health Maintenance Due: Influenza vaccine 2020- discussed; to be completed in season with doctor or local pharmacy.   Update all pending maintenance due as appropriate.   See completed HM at the end of note.   Eye: Visual acuity not assessed. Virtual visit. Wears corrective lenses. Followed by their ophthalmologist every 12 months.   Dental: Visits every 6 months.    Hearing: Demonstrates normal hearing during visit.  Safety:  Patient feels safe at home- yes Patient does have smoke detectors at home- yes Patient does wear sunscreen or protective clothing when in direct sunlight - yes Patient does wear seat belt when in a moving vehicle - yes Patient drives- yes Adequate lighting in walkways free from debris- yes Grab bars and handrails used as appropriate- yes Ambulates with no assistive device Cell phone or lifeline/life alert/medic alert on person when ambulating outside of the home- yes  Social: Alcohol intake - yes      Smoking history- never  Smokers in home? none Illicit drug use? none  Depression: PHQ 2 &9  complete. See screening below. Denies irritability, anhedonia, sadness/tearfullness.  Stable.   Falls: See screening below.    Medication: Taking as directed and without issues.   Covid-19: Precautions and sickness symptoms discussed. Wears mask, social distancing, hand hygiene as appropriate.   Activities of Daily Living Patient denies needing assistance with: household chores, feeding themselves, getting from bed to chair, getting to the toilet, bathing/showering, dressing, managing money, or preparing meals.   Memory: Patient is alert. Patient denies difficulty focusing or concentrating. Correctly identified the president of the [place], season and recall. Patient likes to read and complete puzzles for brain stimulation.  BMI- discussed the importance of a healthy diet, water intake and the benefits of aerobic exercise.  Educational material provided.  Physical activity- walking, golf, weights 3-4 times weekly, 30-50 minutes.  Diet: Regular Water: good intake Caffeine: very seldom Protein supplement bars: yes  Advanced Directive: End of life planning; Advance aging; Advanced directives discussed.  Copy of current HCPOA/Living Will on file.    Other Providers Patient Care Team: Einar Pheasant, MD as PCP - General (Internal Medicine)  Exercise Activities and Dietary recommendations Current Exercise Habits: Home exercise routine, Type of exercise: walking;calisthenics(golf), Time (Minutes): 40, Frequency (Times/Week): 4, Weekly Exercise (Minutes/Week): 160, Intensity: Moderate  Goals      Patient Stated   . Weight (lb) < 170 lb (77.1 kg) (pt-stated)       Fall Risk Fall Risk  01/08/2019 11/13/2018 01/01/2018 12/27/2016 06/09/2016  Falls in the past year? 0 0 No No No   Timed Get Up and Go Performed: no, virtual visit  Depression Screen PHQ 2/9 Scores 01/08/2019 11/13/2018 01/01/2018 12/27/2016  PHQ - 2 Score 0 0 0 0  PHQ- 9 Score 0 0 - -    Cognitive Function MMSE - Mini  Mental State Exam 01/01/2018 12/27/2016  Orientation to time 5 5  Orientation to Place 5 5  Registration 3 3  Attention/ Calculation 5 5  Recall 3 3  Language- name 2 objects 2 2  Language- repeat 1 1  Language- follow 3 step command 3 3  Language- read & follow direction 1 1  Write a sentence 1 1  Copy design 1 1  Total score 30 30     6CIT Screen 01/08/2019  What Year? 0 points  What month? 0 points  What time? 0 points  Count back from 20 0 points  Months in reverse 0 points  Repeat phrase 0 points  Total Score 0    Immunization History  Administered Date(s) Administered  . Influenza Whole 01/28/2009, 02/09/2010, 01/24/2016  . Influenza, High Dose Seasonal PF 12/27/2016, 01/01/2018  . Influenza,inj,Quad PF,6+ Mos 01/28/2014  . Pneumococcal Conjugate-13 06/09/2016  . Pneumococcal Polysaccharide-23 04/16/2012  . Tdap 01/01/2004, 02/02/2016   Screening Tests Health Maintenance  Topic Date Due  . INFLUENZA VACCINE  07/24/2019 (Originally 11/24/2018)  . TETANUS/TDAP  02/01/2026  . COLONOSCOPY  06/14/2027  . Hepatitis C Screening  Completed  . PNA vac Low Risk Adult  Completed       Plan:    Keep all routine maintenance appointments.   Flu injection scheduled nurse visit 01/25/19 @ 1030  Next scheduled lab 05/16/18  6 month follow up 05/20/19   Medicare Attestation I have personally reviewed: The patient's medical and social history Their use of alcohol, tobacco or illicit drugs Their current medications and supplements The patient's functional ability including ADLs,fall risks, home safety risks, cognitive, and hearing and visual impairment Diet and physical activities Evidence for depression   In addition, I have reviewed and discussed with patient certain preventive protocols, quality metrics, and best practice recommendations. A written personalized care plan for preventive services as well as general preventive health recommendations were provided to patient via  mail.     Varney Biles, LPN  [phone]   Reviewed above information.  Agree with assessment and plan.    Dr Nicki Reaper

## 2019-01-08 NOTE — Patient Instructions (Addendum)
  Kyle Meadows , Thank you for taking time to come for your Medicare Wellness Visit. I appreciate your ongoing commitment to your health goals. Please review the following plan we discussed and let me know if I can assist you in the future.   These are the goals we discussed: Goals      Patient Stated   . Weight (lb) < 170 lb (77.1 kg) (pt-stated)       This is a list of the screening recommended for you and due dates:  Health Maintenance  Topic Date Due  . Flu Shot  07/24/2019*  . Tetanus Vaccine  02/01/2026  . Colon Cancer Screening  06/14/2027  .  Hepatitis C: One time screening is recommended by Center for Disease Control  (CDC) for  adults born from [birth year] through 1965.   Completed  . Pneumonia vaccines  Completed  *Topic was postponed. The date shown is not the original due date.

## 2019-01-25 ENCOUNTER — Ambulatory Visit (INDEPENDENT_AMBULATORY_CARE_PROVIDER_SITE_OTHER): Payer: PPO

## 2019-01-25 ENCOUNTER — Other Ambulatory Visit: Payer: Self-pay

## 2019-01-25 DIAGNOSIS — Z23 Encounter for immunization: Secondary | ICD-10-CM | POA: Diagnosis not present

## 2019-02-04 ENCOUNTER — Other Ambulatory Visit: Payer: Self-pay | Admitting: Internal Medicine

## 2019-02-04 ENCOUNTER — Telehealth: Payer: Self-pay | Admitting: Internal Medicine

## 2019-02-04 DIAGNOSIS — D2262 Melanocytic nevi of left upper limb, including shoulder: Secondary | ICD-10-CM | POA: Diagnosis not present

## 2019-02-04 DIAGNOSIS — R208 Other disturbances of skin sensation: Secondary | ICD-10-CM | POA: Diagnosis not present

## 2019-02-04 DIAGNOSIS — D2272 Melanocytic nevi of left lower limb, including hip: Secondary | ICD-10-CM | POA: Diagnosis not present

## 2019-02-04 DIAGNOSIS — L820 Inflamed seborrheic keratosis: Secondary | ICD-10-CM | POA: Diagnosis not present

## 2019-02-04 DIAGNOSIS — D485 Neoplasm of uncertain behavior of skin: Secondary | ICD-10-CM | POA: Diagnosis not present

## 2019-02-04 DIAGNOSIS — L538 Other specified erythematous conditions: Secondary | ICD-10-CM | POA: Diagnosis not present

## 2019-02-04 DIAGNOSIS — Z85828 Personal history of other malignant neoplasm of skin: Secondary | ICD-10-CM | POA: Diagnosis not present

## 2019-02-04 DIAGNOSIS — Z85820 Personal history of malignant melanoma of skin: Secondary | ICD-10-CM | POA: Diagnosis not present

## 2019-02-04 DIAGNOSIS — D0439 Carcinoma in situ of skin of other parts of face: Secondary | ICD-10-CM | POA: Diagnosis not present

## 2019-02-04 DIAGNOSIS — Z9109 Other allergy status, other than to drugs and biological substances: Secondary | ICD-10-CM

## 2019-02-04 DIAGNOSIS — X32XXXA Exposure to sunlight, initial encounter: Secondary | ICD-10-CM | POA: Diagnosis not present

## 2019-02-04 DIAGNOSIS — C44519 Basal cell carcinoma of skin of other part of trunk: Secondary | ICD-10-CM | POA: Diagnosis not present

## 2019-02-04 DIAGNOSIS — L570 Actinic keratosis: Secondary | ICD-10-CM | POA: Diagnosis not present

## 2019-02-04 DIAGNOSIS — C4441 Basal cell carcinoma of skin of scalp and neck: Secondary | ICD-10-CM | POA: Diagnosis not present

## 2019-02-04 NOTE — Progress Notes (Signed)
Order placed for referral to allergist.  °

## 2019-02-04 NOTE — Telephone Encounter (Signed)
Pt called in to follow up with PCP. He said that he and PCP previously discussed him seeing an allergist, pt says that he would like to go ahead with referral at this time.   Please assist pt further.

## 2019-02-04 NOTE — Telephone Encounter (Signed)
Pt stated he has discussed with you previously. Wanting referral to allergist.

## 2019-02-04 NOTE — Telephone Encounter (Signed)
Order placed for referral to allergist.  Someone should be contacting him with an appt date and time.

## 2019-02-05 NOTE — Telephone Encounter (Signed)
Pt aware.

## 2019-02-20 DIAGNOSIS — J019 Acute sinusitis, unspecified: Secondary | ICD-10-CM | POA: Diagnosis not present

## 2019-03-07 DIAGNOSIS — J300 Vasomotor rhinitis: Secondary | ICD-10-CM | POA: Diagnosis not present

## 2019-03-07 DIAGNOSIS — J309 Allergic rhinitis, unspecified: Secondary | ICD-10-CM | POA: Diagnosis not present

## 2019-03-08 DIAGNOSIS — D0439 Carcinoma in situ of skin of other parts of face: Secondary | ICD-10-CM | POA: Diagnosis not present

## 2019-03-15 DIAGNOSIS — D485 Neoplasm of uncertain behavior of skin: Secondary | ICD-10-CM | POA: Diagnosis not present

## 2019-03-15 DIAGNOSIS — L820 Inflamed seborrheic keratosis: Secondary | ICD-10-CM | POA: Diagnosis not present

## 2019-03-15 DIAGNOSIS — C4441 Basal cell carcinoma of skin of scalp and neck: Secondary | ICD-10-CM | POA: Diagnosis not present

## 2019-03-15 DIAGNOSIS — C44519 Basal cell carcinoma of skin of other part of trunk: Secondary | ICD-10-CM | POA: Diagnosis not present

## 2019-04-03 ENCOUNTER — Ambulatory Visit: Payer: PPO | Admitting: Podiatry

## 2019-04-03 ENCOUNTER — Encounter: Payer: Self-pay | Admitting: Podiatry

## 2019-04-03 ENCOUNTER — Other Ambulatory Visit: Payer: Self-pay

## 2019-04-03 DIAGNOSIS — L603 Nail dystrophy: Secondary | ICD-10-CM | POA: Diagnosis not present

## 2019-04-03 MED ORDER — TERBINAFINE HCL 250 MG PO TABS: 250.0000 mg | ORAL_TABLET | Freq: Every day | ORAL | 0 refills | Status: DC

## 2019-04-03 NOTE — Progress Notes (Signed)
He presents today for follow-up of his nail fungus..  He has completed 120 days of Lamisil therapy and states that is doing well it seems to be growing out very nicely not happy about it.  He denies fever chills nausea vomiting muscle aches pains calf pain back pain chest pain shortness of breath he also denies any new medications he denies any itching or rashes.  Objective: Vital signs are stable he is alert and oriented x3.  Pulses are strongly palpable.  Toenails appear to be growing out I would suspect about 75%.  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  Plan: I would like to continue therapy at least 1 tablet every other day until he is 100% grown out.  I will go ahead and prescribe another 30 tablets which she will take 1 every other day basis lasting him 60 days.  I will follow-up with him in 3 months.

## 2019-05-17 ENCOUNTER — Other Ambulatory Visit: Payer: Self-pay

## 2019-05-17 ENCOUNTER — Other Ambulatory Visit (INDEPENDENT_AMBULATORY_CARE_PROVIDER_SITE_OTHER): Payer: PPO

## 2019-05-17 DIAGNOSIS — E7800 Pure hypercholesterolemia, unspecified: Secondary | ICD-10-CM | POA: Diagnosis not present

## 2019-05-17 LAB — BASIC METABOLIC PANEL
BUN: 27 mg/dL — ABNORMAL HIGH (ref 6–23)
CO2: 31 mEq/L (ref 19–32)
Calcium: 9.4 mg/dL (ref 8.4–10.5)
Chloride: 105 mEq/L (ref 96–112)
Creatinine, Ser: 1.02 mg/dL (ref 0.40–1.50)
GFR: 71.51 mL/min (ref >60.00)
Glucose, Bld: 90 mg/dL (ref 70–99)
Potassium: 4.3 mEq/L (ref 3.5–5.1)
Sodium: 143 mEq/L (ref 135–145)

## 2019-05-17 LAB — LIPID PANEL
Cholesterol: 155 mg/dL (ref 0–200)
HDL: 38.5 mg/dL — ABNORMAL LOW (ref >39.00)
LDL Cholesterol: 95 mg/dL (ref 0–99)
NonHDL: 116.71
Total CHOL/HDL Ratio: 4
Triglycerides: 108 mg/dL (ref 0.0–149.0)
VLDL: 21.6 mg/dL (ref 0.0–40.0)

## 2019-05-17 LAB — HEPATIC FUNCTION PANEL
ALT: 13 U/L (ref 0–53)
AST: 17 U/L (ref 0–37)
Albumin: 4.4 g/dL (ref 3.5–5.2)
Alkaline Phosphatase: 54 U/L (ref 39–117)
Bilirubin, Direct: 0.1 mg/dL (ref 0.0–0.3)
Total Bilirubin: 0.4 mg/dL (ref 0.2–1.2)
Total Protein: 6.5 g/dL (ref 6.0–8.3)

## 2019-05-20 ENCOUNTER — Ambulatory Visit (INDEPENDENT_AMBULATORY_CARE_PROVIDER_SITE_OTHER): Payer: PPO | Admitting: Internal Medicine

## 2019-05-20 ENCOUNTER — Other Ambulatory Visit: Payer: Self-pay

## 2019-05-20 DIAGNOSIS — E7800 Pure hypercholesterolemia, unspecified: Secondary | ICD-10-CM

## 2019-05-20 DIAGNOSIS — Z9109 Other allergy status, other than to drugs and biological substances: Secondary | ICD-10-CM

## 2019-05-20 DIAGNOSIS — N400 Enlarged prostate without lower urinary tract symptoms: Secondary | ICD-10-CM

## 2019-05-20 DIAGNOSIS — I723 Aneurysm of iliac artery: Secondary | ICD-10-CM | POA: Diagnosis not present

## 2019-05-20 NOTE — Progress Notes (Signed)
[name], male   DOB: 1946-01-23, 74 y.o.   MRN: [id number]   Virtual Visit via video Note  This visit type was conducted due to national recommendations for restrictions regarding the COVID-19 pandemic (e.g. social distancing).  This format is felt to be most appropriate for this patient at this time.  All issues noted in this document were discussed and addressed.  No physical exam was performed (except for noted visual exam findings with Video Visits).   I connected with Kyle Meadows by a video enabled telemedicine application and verified that I am speaking with the correct person using two identifiers. Location patient: home Location provider: work or home office Persons participating in the virtual visit: patient, provider  The limitations, risks, security and privacy concerns of performing an evaluation and management service by video and the availability of in person appointments have been discussed.  The patient expressed understanding and agreed to proceed.   Reason for visit: scheduled follow up.   HPI: He reports he is doing relatively well.  Trying to stay active through covid restrictions.  Plays golf when able.  Taking an art class.  Reading.  Overall handling things well.  No chest pain or sob with increased activity or exertion.  No abdominal pain or bowel change reported.  States has noticed his urine smells, but reports no dysuria, hematuria, urgency or increased frequency.  Sees urology.  On finasteride.  Has chronic post nasal drainage.  Has seen Dr Manuella Ghazi.  On singulair and xyzal.  Does help some.  Occasional cough.  No chest congestion or difficulty breathing.  Overall he feels he is doing well.  Discussed labs.     ROS: See pertinent positives and negatives per HPI.  Past Medical History:  Diagnosis Date  . Arthritis   . Diverticulosis, sigmoid   . Environmental allergies   . Frequency of urination   . H/O sleep apnea    S/P  OSA surgery -- resolved   . Hemorrhoids   . History of BPH   . History of melanoma excision    back  . History of nephritis    age 64  . History of squamous cell carcinoma excision    right arm, leg, face  . History of urinary retention    secondary bph with obstruction  . Melanoma ([place])   . Urethral stricture     Past Surgical History:  Procedure Laterality Date  . COLONOSCOPY WITH PROPOFOL N/A 06/13/2017   Procedure: COLONOSCOPY WITH PROPOFOL;  Surgeon: Lollie Sails, MD;  Location: [facility];  Service: Endoscopy;  Laterality: N/A;  . CYSTOSCOPY WITH RETROGRADE URETHROGRAM N/A 05/08/2014   Procedure: CYSTOSCOPY WITH RETROGRADE URETHROGRAM;  Surgeon: Alexis Frock, MD;  Location: [facility];  Service: Urology;  Laterality: N/A;  . CYSTOSCOPY WITH URETHRAL DILATATION N/A 05/08/2014   Procedure: CYSTOSCOPY WITH URETHRAL DILATATION;  Surgeon: Alexis Frock, MD;  Location: [facility];  Service: Urology;  Laterality: N/A;  . KNEE ARTHROSCOPY W/ MENISCECTOMY Right x2   last one 2005  . LAPAROSCOPIC INGUINAL HERNIA REPAIR Right 2007 (approx)  . ORIF  RIGHT WRIST FX  2008   retained hardware  . ROBOT ASSISTED LAPAROSCOPIC RADICAL PROSTATECTOMY N/A 03/19/2014   Procedure: ROBOTIC ASSISTED LAPAROSCOPIC SIMPLE PROSTATECTOMY;  Surgeon: Alexis Frock, MD;  Location: [facility];  Service: Urology;  Laterality: N/A;  . TONSILLECTOMY  age 63  . UVULOPALATOPHARYNGOPLASTY  x5  last one 1995    Family History  Problem Relation Age of Onset  . Heart disease Father        CABG x 4  . Arthritis Father   . Glaucoma Father   . Hepatitis C Mother   . Thyroid disease Sister     SOCIAL HX: reviewed.    Current Outpatient Medications:  .  azelastine (ASTELIN) 0.1 % nasal spray, Place 1 spray into both nostrils 2 (two) times daily., Disp: , Rfl:  .  Boswellia-Glucosamine-Vit D (OSTEO BI-FLEX ONE PER DAY PO), Take by mouth., Disp: , Rfl:  .  Coenzyme Q10 (COQ10 PO), Take by mouth.,  Disp: , Rfl:  .  finasteride (PROSCAR) 5 MG tablet, Take 5 mg by mouth daily., Disp: , Rfl:  .  levocetirizine (XYZAL) 5 MG tablet, Take 5 mg by mouth at bedtime., Disp: , Rfl:  .  meloxicam (MOBIC) 15 MG tablet, Take 15 mg by mouth 2 (two) times daily. , Disp: , Rfl:  .  meloxicam (MOBIC) 7.5 MG tablet, , Disp: , Rfl:  .  montelukast (SINGULAIR) 10 MG tablet, Take 10 mg by mouth at bedtime., Disp: , Rfl:  .  Multiple Vitamins-Minerals (CENTRUM SILVER ADULT 50+ PO), Take 1 tablet by mouth daily., Disp: , Rfl:  .  Omega-3 Fatty Acids (FISH OIL PO), Take by mouth., Disp: , Rfl:  .  oxybutynin (DITROPAN) 5 MG tablet, TK 1 T PO Q 8 H PRF URINARY FREQUENCY OR URGENCY, Disp: , Rfl: 2 .  terbinafine (LAMISIL) 250 MG tablet, Take 1 tablet (250 mg total) by mouth daily., Disp: 30 tablet, Rfl: 0 .  rosuvastatin (CRESTOR) 10 MG tablet, TAKE ONE TABLET BY MOUTH EVERY DAY, Disp: 90 tablet, Rfl: 3  EXAM:  GENERAL: alert, oriented, appears well and in no acute distress  HEENT: atraumatic, conjunttiva clear, no obvious abnormalities on inspection of external nose and ears  NECK: normal movements of the head and neck  LUNGS: on inspection no signs of respiratory distress, breathing rate appears normal, no obvious gross SOB, gasping or wheezing  CV: no obvious cyanosis  PSYCH/NEURO: pleasant and cooperative, no obvious depression or anxiety, speech and thought processing grossly intact  ASSESSMENT AND PLAN:  Discussed the following assessment and plan:  Environmental allergies Sees Dr Donneta Romberg.  Stable on current regimen.  Continue on singulair and xyzal.  Follow.    Enlarged prostate On finasteride.  Followed by urology.  Stable.  Reports hs noticed urine smells.  Denies any dysuria, hematuria, urgency or increased frequency. Discussed checking urine.  Wants to monitor.    Hypercholesterolemia On crestor.  Low cholesterol diet and exercise.  Follow lipid panel and liver function tests.    Iliac  artery aneurysm ([facility]) History of bilateral iliac artery aneurysm.  Evaluated by [facility].  Last evaluated 11/2017.  Stable.  Recommended f/u in one year.  Need to see if has f/u scheduled.     Orders Placed This Encounter  Procedures  . CBC with Differential/Platelet    Standing Status:   Future    Standing Expiration Date:   05/19/2020  . Hepatic function panel    Standing Status:   Future    Standing Expiration Date:   05/19/2020  . Lipid panel    Standing Status:   Future    Standing Expiration Date:   05/19/2020  . TSH    Standing Status:   Future    Standing Expiration Date:   05/19/2020  . Basic metabolic panel    Standing Status:   Future  Standing Expiration Date:   05/19/2020     I discussed the assessment and treatment plan with the patient. The patient was provided an opportunity to ask questions and all were answered. The patient agreed with the plan and demonstrated an understanding of the instructions.   The patient was advised to call back or seek an in-person evaluation if the symptoms worsen or if the condition fails to improve as anticipated.   Einar Pheasant, MD

## 2019-05-23 ENCOUNTER — Other Ambulatory Visit: Payer: Self-pay | Admitting: Internal Medicine

## 2019-05-23 ENCOUNTER — Ambulatory Visit: Payer: PPO

## 2019-05-25 ENCOUNTER — Encounter: Payer: Self-pay | Admitting: Internal Medicine

## 2019-05-25 ENCOUNTER — Telehealth: Payer: Self-pay | Admitting: Internal Medicine

## 2019-05-25 NOTE — Telephone Encounter (Signed)
My chart message sent to pt regarding f/u with [facility].

## 2019-05-25 NOTE — Assessment & Plan Note (Signed)
On finasteride.  Followed by urology.  Stable.  Reports hs noticed urine smells.  Denies any dysuria, hematuria, urgency or increased frequency. Discussed checking urine.  Wants to monitor.

## 2019-05-25 NOTE — Assessment & Plan Note (Signed)
On crestor.  Low cholesterol diet and exercise.  Follow lipid panel and liver function tests.   

## 2019-05-25 NOTE — Assessment & Plan Note (Signed)
History of bilateral iliac artery aneurysm.  Evaluated by [facility].  Last evaluated 11/2017.  Stable.  Recommended f/u in one year.  Need to see if has f/u scheduled.

## 2019-05-25 NOTE — Assessment & Plan Note (Signed)
Sees Dr Donneta Romberg.  Stable on current regimen.  Continue on singulair and xyzal.  Follow.

## 2019-05-30 ENCOUNTER — Other Ambulatory Visit: Payer: Self-pay | Admitting: Podiatry

## 2019-06-01 ENCOUNTER — Ambulatory Visit: Payer: PPO

## 2019-06-06 ENCOUNTER — Ambulatory Visit: Payer: PPO

## 2019-06-13 ENCOUNTER — Ambulatory Visit: Payer: PPO

## 2019-07-03 ENCOUNTER — Encounter: Payer: Self-pay | Admitting: Podiatry

## 2019-07-03 ENCOUNTER — Ambulatory Visit: Payer: PPO | Admitting: Podiatry

## 2019-07-03 ENCOUNTER — Other Ambulatory Visit: Payer: Self-pay

## 2019-07-03 DIAGNOSIS — L603 Nail dystrophy: Secondary | ICD-10-CM | POA: Diagnosis not present

## 2019-07-03 MED ORDER — TERBINAFINE HCL 250 MG PO TABS: 250.0000 mg | ORAL_TABLET | Freq: Every day | ORAL | 0 refills | Status: DC

## 2019-07-03 NOTE — Progress Notes (Signed)
He presents today for follow-up of his toenails he states that they are doing a lot better still a little on the end of my big toe right foot.  Objective: Vital signs are stable he is alert oriented x3.  Pulses are palpable right.  I see no sign of onychomycosis on any toe except for the hallux right which is just a small amount at the end.  Assessment: Well-healing onychomycosis bilaterally.  Plan: Continue the use of Lamisil No. 31 p.o. q. OD and I will follow-up with him in about 3 to 4 months.

## 2019-07-15 DIAGNOSIS — D2261 Melanocytic nevi of right upper limb, including shoulder: Secondary | ICD-10-CM | POA: Diagnosis not present

## 2019-07-15 DIAGNOSIS — D2272 Melanocytic nevi of left lower limb, including hip: Secondary | ICD-10-CM | POA: Diagnosis not present

## 2019-07-15 DIAGNOSIS — D2271 Melanocytic nevi of right lower limb, including hip: Secondary | ICD-10-CM | POA: Diagnosis not present

## 2019-07-15 DIAGNOSIS — Z85828 Personal history of other malignant neoplasm of skin: Secondary | ICD-10-CM | POA: Diagnosis not present

## 2019-07-15 DIAGNOSIS — D2262 Melanocytic nevi of left upper limb, including shoulder: Secondary | ICD-10-CM | POA: Diagnosis not present

## 2019-07-15 DIAGNOSIS — D225 Melanocytic nevi of trunk: Secondary | ICD-10-CM | POA: Diagnosis not present

## 2019-07-15 DIAGNOSIS — Z85820 Personal history of malignant melanoma of skin: Secondary | ICD-10-CM | POA: Diagnosis not present

## 2019-07-15 DIAGNOSIS — L111 Transient acantholytic dermatosis [Grover]: Secondary | ICD-10-CM | POA: Diagnosis not present

## 2019-07-30 DIAGNOSIS — J300 Vasomotor rhinitis: Secondary | ICD-10-CM | POA: Diagnosis not present

## 2019-09-01 DIAGNOSIS — J302 Other seasonal allergic rhinitis: Secondary | ICD-10-CM | POA: Diagnosis not present

## 2019-10-09 ENCOUNTER — Encounter: Payer: PPO | Admitting: Podiatry

## 2019-10-30 ENCOUNTER — Other Ambulatory Visit: Payer: Self-pay

## 2019-10-30 ENCOUNTER — Encounter: Payer: Self-pay | Admitting: Podiatry

## 2019-10-30 ENCOUNTER — Ambulatory Visit: Payer: PPO | Admitting: Podiatry

## 2019-10-30 DIAGNOSIS — L603 Nail dystrophy: Secondary | ICD-10-CM | POA: Diagnosis not present

## 2019-10-30 MED ORDER — TERBINAFINE HCL 250 MG PO TABS: 250.0000 mg | ORAL_TABLET | Freq: Every day | ORAL | 0 refills | Status: DC

## 2019-10-30 NOTE — Progress Notes (Signed)
He presents today states that his toenail is almost grown out he said they have improved a lot.  Objective: Vital signs are stable alert oriented x3. There is no erythema edema cellulitis drainage or odor. Toenail has grown out to 98%. There is just a small amount left to grow out.  Assessment: Onychomycosis.  Plan: At this point I will do one more dose just to make sure this grows out all the way without any regression. We are going to recommend 30 tablets one every other day for the next 2 months.

## 2019-11-22 ENCOUNTER — Other Ambulatory Visit: Payer: Self-pay

## 2019-11-22 ENCOUNTER — Other Ambulatory Visit (INDEPENDENT_AMBULATORY_CARE_PROVIDER_SITE_OTHER): Payer: PPO

## 2019-11-22 DIAGNOSIS — E7800 Pure hypercholesterolemia, unspecified: Secondary | ICD-10-CM

## 2019-11-22 LAB — LIPID PANEL
Cholesterol: 161 mg/dL (ref 0–200)
HDL: 45.3 mg/dL (ref >39.00)
LDL Cholesterol: 101 mg/dL — ABNORMAL HIGH (ref 0–99)
NonHDL: 115.31
Total CHOL/HDL Ratio: 4
Triglycerides: 74 mg/dL (ref 0.0–149.0)
VLDL: 14.8 mg/dL (ref 0.0–40.0)

## 2019-11-22 LAB — CBC WITH DIFFERENTIAL/PLATELET
Basophils Absolute: 0.1 10*3/uL (ref 0.0–0.1)
Basophils Relative: 1.1 % (ref 0.0–3.0)
Eosinophils Absolute: 0.2 10*3/uL (ref 0.0–0.7)
Eosinophils Relative: 3.1 % (ref 0.0–5.0)
HCT: 41.5 % (ref 39.0–52.0)
Hemoglobin: 14 g/dL (ref 13.0–17.0)
Lymphocytes Relative: 27.8 % (ref 12.0–46.0)
Lymphs Abs: 1.8 10*3/uL (ref 0.7–4.0)
MCHC: 33.6 g/dL (ref 30.0–36.0)
MCV: 92.8 fl (ref 78.0–100.0)
Monocytes Absolute: 0.4 10*3/uL (ref 0.1–1.0)
Monocytes Relative: 6.5 % (ref 3.0–12.0)
Neutro Abs: 3.9 10*3/uL (ref 1.4–7.7)
Neutrophils Relative %: 61.5 % (ref 43.0–77.0)
Platelets: 179 10*3/uL (ref 150.0–400.0)
RBC: 4.48 Mil/uL (ref 4.22–5.81)
RDW: 13.8 % (ref 11.5–15.5)
WBC: 6.3 10*3/uL (ref 4.0–10.5)

## 2019-11-22 LAB — HEPATIC FUNCTION PANEL
ALT: 18 U/L (ref 0–53)
AST: 19 U/L (ref 0–37)
Albumin: 4.2 g/dL (ref 3.5–5.2)
Alkaline Phosphatase: 50 U/L (ref 39–117)
Bilirubin, Direct: 0.1 mg/dL (ref 0.0–0.3)
Total Bilirubin: 0.5 mg/dL (ref 0.2–1.2)
Total Protein: 6.5 g/dL (ref 6.0–8.3)

## 2019-11-22 LAB — TSH: TSH: 1.56 u[IU]/mL (ref 0.35–4.50)

## 2019-11-22 LAB — BASIC METABOLIC PANEL
BUN: 23 mg/dL (ref 6–23)
CO2: 31 mEq/L (ref 19–32)
Calcium: 9.1 mg/dL (ref 8.4–10.5)
Chloride: 105 mEq/L (ref 96–112)
Creatinine, Ser: 1.03 mg/dL (ref 0.40–1.50)
GFR: 70.6 mL/min (ref >60.00)
Glucose, Bld: 92 mg/dL (ref 70–99)
Potassium: 4.1 mEq/L (ref 3.5–5.1)
Sodium: 140 mEq/L (ref 135–145)

## 2019-11-25 ENCOUNTER — Ambulatory Visit (INDEPENDENT_AMBULATORY_CARE_PROVIDER_SITE_OTHER): Payer: PPO | Admitting: Internal Medicine

## 2019-11-25 ENCOUNTER — Other Ambulatory Visit: Payer: Self-pay

## 2019-11-25 VITALS — BP 128/72 | HR 70 | Temp 97.7°F | Resp 16 | Ht 69.0 in | Wt 163.0 lb

## 2019-11-25 DIAGNOSIS — I723 Aneurysm of iliac artery: Secondary | ICD-10-CM

## 2019-11-25 DIAGNOSIS — Z125 Encounter for screening for malignant neoplasm of prostate: Secondary | ICD-10-CM

## 2019-11-25 DIAGNOSIS — Z9109 Other allergy status, other than to drugs and biological substances: Secondary | ICD-10-CM | POA: Diagnosis not present

## 2019-11-25 DIAGNOSIS — E7800 Pure hypercholesterolemia, unspecified: Secondary | ICD-10-CM

## 2019-11-25 MED ORDER — ROSUVASTATIN CALCIUM 10 MG PO TABS: ORAL_TABLET | ORAL | 1 refills | Status: DC

## 2019-11-25 NOTE — Progress Notes (Signed)
[name], male   DOB: 05-22-1945, 74 y.o.   MRN: [id number]   Subjective:    [name], male    DOB: [birth year], 74 y.o.   MRN: [id number]  HPI This visit occurred during the SARS-CoV-2 public health emergency.  Safety protocols were in place, including screening questions prior to the visit, additional usage of staff PPE, and extensive cleaning of exam room while observing appropriate contact time as indicated for disinfecting solutions.  Patient here for a scheduled follow up.  He reports he is doing well  Feels good.  Exercising.  Riding his bike.  No chest pain or sob.  No acid reflux or abdominal pain reported.  Bowels moving.  Has adjusted his diet.  Lost weight.  Discussed labs.    Past Medical History:  Diagnosis Date  . Arthritis   . Diverticulosis, sigmoid   . Environmental allergies   . Frequency of urination   . H/O sleep apnea    S/P  OSA surgery -- resolved  . Hemorrhoids   . History of BPH   . History of melanoma excision    back  . History of nephritis    age 74  . History of squamous cell carcinoma excision    right arm, leg, face  . History of urinary retention    secondary bph with obstruction  . Melanoma ([place])   . Urethral stricture    Past Surgical History:  Procedure Laterality Date  . COLONOSCOPY WITH PROPOFOL N/A 06/13/2017   Procedure: COLONOSCOPY WITH PROPOFOL;  Surgeon: Lollie Sails, MD;  Location: [facility];  Service: Endoscopy;  Laterality: N/A;  . CYSTOSCOPY WITH RETROGRADE URETHROGRAM N/A 05/08/2014   Procedure: CYSTOSCOPY WITH RETROGRADE URETHROGRAM;  Surgeon: Alexis Frock, MD;  Location: [facility];  Service: Urology;  Laterality: N/A;  . CYSTOSCOPY WITH URETHRAL DILATATION N/A 05/08/2014   Procedure: CYSTOSCOPY WITH URETHRAL DILATATION;  Surgeon: Alexis Frock, MD;  [facility];  Service: Urology;  Laterality: N/A;  . KNEE ARTHROSCOPY W/ MENISCECTOMY Right x2    last one 2005  . LAPAROSCOPIC INGUINAL HERNIA REPAIR Right 2007 (approx)  . ORIF  RIGHT WRIST FX  2008   retained hardware  . ROBOT ASSISTED LAPAROSCOPIC RADICAL PROSTATECTOMY N/A 03/19/2014   Procedure: ROBOTIC ASSISTED LAPAROSCOPIC SIMPLE PROSTATECTOMY;  Surgeon: Alexis Frock, MD;  Location: [facility];  Service: Urology;  Laterality: N/A;  . TONSILLECTOMY  age 76  . UVULOPALATOPHARYNGOPLASTY  x5  last one 1995   Family History  Problem Relation Age of Onset  . Heart disease Father        CABG x 4  . Arthritis Father   . Glaucoma Father   . Hepatitis C Mother   . Thyroid disease Sister    Social History   Socioeconomic History  . Marital status: Married    Spouse name: Not on file  . Number of children: Not on file  . Years of education: Not on file  . Highest education level: Not on file  Occupational History  . Not on file  Tobacco Use  . Smoking status: Never Smoker  . Smokeless tobacco: Never Used  Substance and Sexual Activity  . Alcohol use: Yes    Alcohol/week: 0.0 standard drinks    Comment: rare  . Drug use: No  . Sexual activity: Not on file  Other Topics Concern  . Not on file  Social History Narrative  . Not on file  Social Determinants of Health   Financial Resource Strain:   . Difficulty of Paying Living Expenses:   Food Insecurity:   . Worried About Charity fundraiser in the Last Year:   . Arboriculturist in the Last Year:   Transportation Needs:   . Film/video editor (Medical):   Marland Kitchen Lack of Transportation (Non-Medical):   Physical Activity:   . Days of Exercise per Week:   . Minutes of Exercise per Session:   Stress:   . Feeling of Stress :   Social Connections:   . Frequency of Communication with Friends and Family:   . Frequency of Social Gatherings with Friends and Family:   . Attends Religious Services:   . Active Member of Clubs or Organizations:   . Attends Archivist Meetings:   Marland Kitchen Marital Status:     Outpatient  Encounter Medications as of 11/25/2019  Medication Sig  . Alpha-Lipoic Acid 300 MG TABS Take by mouth.  . DOCUSATE SODIUM PO Take by mouth.  . Boswellia-Glucosamine-Vit D (OSTEO BI-FLEX ONE PER DAY PO) Take by mouth.  . Coenzyme Q10 (COQ10 PO) Take by mouth.  . finasteride (PROSCAR) 5 MG tablet Take 5 mg by mouth daily.  Marland Kitchen levocetirizine (XYZAL) 5 MG tablet Take 5 mg by mouth at bedtime.  . meloxicam (MOBIC) 7.5 MG tablet   . montelukast (SINGULAIR) 10 MG tablet Take 10 mg by mouth at bedtime.  . Multiple Vitamins-Minerals (CENTRUM SILVER ADULT 50+ PO) Take 1 tablet by mouth daily.  . Omega-3 Fatty Acids (FISH OIL PO) Take by mouth.  . oxybutynin (DITROPAN) 5 MG tablet TK 1 T PO Q 8 H PRF URINARY FREQUENCY OR URGENCY  . rosuvastatin (CRESTOR) 10 MG tablet Take 2 tablets on Monday, Wednesday and Friday and one tablet all other days.  Marland Kitchen terbinafine (LAMISIL) 250 MG tablet Take 1 tablet (250 mg total) by mouth daily.  . [DISCONTINUED] azelastine (ASTELIN) 0.1 % nasal spray Place 1 spray into both nostrils 2 (two) times daily.  . [DISCONTINUED] meloxicam (MOBIC) 15 MG tablet Take 15 mg by mouth 2 (two) times daily.   . [DISCONTINUED] rosuvastatin (CRESTOR) 10 MG tablet TAKE ONE TABLET BY MOUTH EVERY DAY   No facility-administered encounter medications on file as of 11/25/2019.    Review of Systems  Constitutional: Negative for appetite change and unexpected weight change.  HENT: Negative for congestion and sinus pressure.   Respiratory: Negative for cough, chest tightness and shortness of breath.   Cardiovascular: Negative for chest pain, palpitations and leg swelling.  Gastrointestinal: Negative for abdominal pain, diarrhea, nausea and vomiting.  Genitourinary: Negative for difficulty urinating and dysuria.  Musculoskeletal: Negative for joint swelling and myalgias.  Skin: Negative for color change and rash.  Neurological: Negative for dizziness, light-headedness and headaches.    Psychiatric/Behavioral: Negative for agitation and dysphoric mood.       Objective:    Physical Exam Vitals reviewed.  Constitutional:      General: He is not in acute distress.    Appearance: Normal appearance. He is well-developed.  HENT:     Head: Normocephalic and atraumatic.     Right Ear: External ear normal.     Left Ear: External ear normal.  Eyes:     General: No scleral icterus.       Right eye: No discharge.        Left eye: No discharge.     Conjunctiva/sclera: Conjunctivae normal.  Cardiovascular:  Rate and Rhythm: Normal rate and regular rhythm.  Pulmonary:     Effort: Pulmonary effort is normal. No respiratory distress.     Breath sounds: Normal breath sounds.  Abdominal:     General: Bowel sounds are normal.     Palpations: Abdomen is soft.     Tenderness: There is no abdominal tenderness.  Musculoskeletal:        General: No swelling or tenderness.     Cervical back: Neck supple. No tenderness.  Lymphadenopathy:     Cervical: No cervical adenopathy.  Skin:    Findings: No erythema or rash.  Neurological:     Mental Status: He is alert.  Psychiatric:        Mood and Affect: Mood normal.        Behavior: Behavior normal.     BP 128/72   Pulse 70   Temp 97.7 F (36.5 C)   Resp 16   Ht 5\' 9"  (1.753 m)   Wt 163 lb (73.9 kg)   SpO2 98%   BMI 24.07 kg/m  Wt Readings from Last 3 Encounters:  11/25/19 163 lb (73.9 kg)  11/13/18 181 lb (82.1 kg)  01/04/18 177 lb 6.4 oz (80.5 kg)     Lab Results  Component Value Date   WBC 6.3 11/22/2019   HGB 14.0 11/22/2019   HCT 41.5 11/22/2019   PLT 179.0 11/22/2019   GLUCOSE 92 11/22/2019   CHOL 161 11/22/2019   TRIG 74.0 11/22/2019   HDL 45.30 11/22/2019   LDLCALC 101 (H) 11/22/2019   ALT 18 11/22/2019   AST 19 11/22/2019   NA 140 11/22/2019   K 4.1 11/22/2019   CL 105 11/22/2019   CREATININE 1.03 11/22/2019   BUN 23 11/22/2019   CO2 31 11/22/2019   TSH 1.56 11/22/2019        Assessment & Plan:   Problem List Items Addressed This Visit    Iliac artery aneurysm ([place])    History of bilateral iliac artery aneurysms. Has been evaluated by [facility].  Last evaluated 11/2017.  Due f/u - had recommended f/u in one year.  Discuss arranging f/u.        Relevant Medications   rosuvastatin (CRESTOR) 10 MG tablet   Hypercholesterolemia    On crestor.  Low cholesterol diet and exercise.  Follow lipid panel and liver function tests.        Relevant Medications   rosuvastatin (CRESTOR) 10 MG tablet   Other Relevant Orders   Hepatic function panel   Lipid panel   Basic metabolic panel   Environmental allergies    Followed by Dr Donneta Romberg.  On xyzal and singulair.         Other Visit Diagnoses    Prostate cancer screening    -  Primary   Relevant Orders   PSA, Medicare       Einar Pheasant, MD

## 2019-12-08 ENCOUNTER — Encounter: Payer: Self-pay | Admitting: Internal Medicine

## 2019-12-08 ENCOUNTER — Telehealth: Payer: Self-pay | Admitting: Internal Medicine

## 2019-12-08 NOTE — Assessment & Plan Note (Signed)
Followed by Dr Donneta Romberg.  On xyzal and singulair.

## 2019-12-08 NOTE — Assessment & Plan Note (Signed)
History of bilateral iliac artery aneurysms. Has been evaluated by [facility].  Last evaluated 11/2017.  Due f/u - had recommended f/u in one year.  Discuss arranging f/u.

## 2019-12-08 NOTE — Assessment & Plan Note (Signed)
On crestor.  Low cholesterol diet and exercise.  Follow lipid panel and liver function tests.   

## 2019-12-08 NOTE — Telephone Encounter (Signed)
Kyle Meadows has seen [facility] for f/u iliac artery aneurysm.  Per note, he was last seen 11/2017.  Overdue f/u.  Is he ok to schedule f/u.

## 2019-12-11 NOTE — Telephone Encounter (Signed)
Left detailed msg for pt

## 2019-12-23 DIAGNOSIS — H40013 Open angle with borderline findings, low risk, bilateral: Secondary | ICD-10-CM | POA: Diagnosis not present

## 2020-01-09 ENCOUNTER — Ambulatory Visit (INDEPENDENT_AMBULATORY_CARE_PROVIDER_SITE_OTHER): Payer: PPO

## 2020-01-09 VITALS — Ht 69.0 in | Wt 163.0 lb

## 2020-01-09 DIAGNOSIS — Z0000 Encounter for general adult medical examination without abnormal findings: Secondary | ICD-10-CM | POA: Diagnosis not present

## 2020-01-09 NOTE — Progress Notes (Addendum)
Subjective:   Kyle Meadows is a 74 y.o. male who presents for Medicare Annual/Subsequent preventive examination.  Review of Systems    No ROS.  Medicare Wellness Virtual Visit.    Cardiac Risk Factors include: advanced age (>49men, >66 women)     Objective:    Today's Vitals   01/09/20 0836  Weight: 163 lb (73.9 kg)  Height: 5\' 9"  (1.753 m)   Body mass index is 24.07 kg/m.  Advanced Directives 01/08/2019 01/01/2018 07/03/2017 06/13/2017 12/27/2016 11/22/2016 05/06/2014  Does Patient Have a Medical Advance Directive? Yes Yes Yes Yes Yes Yes Yes  Type of Paramedic of [place];Living will [place];Living will Living will Living will Living will;Healthcare Power of [place];Living will -  Does patient want to make changes to medical advance directive? No - Patient declined No - Patient declined - - No - Patient declined - -  Copy of [place] in Chart? Yes - validated most recent copy scanned in chart (See row information) Yes - - Yes - Yes    Current Medications (verified) Outpatient Encounter Medications as of 01/09/2020  Medication Sig  . Alpha-Lipoic Acid 300 MG TABS Take by mouth.  . [place]-Glucosamine-Vit D (OSTEO BI-FLEX ONE PER DAY PO) Take by mouth.  . Coenzyme Q10 (COQ10 PO) Take by mouth.  . DOCUSATE SODIUM PO Take by mouth.  . finasteride (PROSCAR) 5 MG tablet Take 5 mg by mouth daily.  Marland Kitchen ipratropium (ATROVENT) 0.06 % nasal spray SMARTSIG:1-2 Spray(s) Both Nares 3 Times Daily  . levocetirizine (XYZAL) 5 MG tablet Take 5 mg by mouth at bedtime.  . meloxicam (MOBIC) 7.5 MG tablet   . montelukast (SINGULAIR) 10 MG tablet Take 10 mg by mouth at bedtime.  . Multiple Vitamins-Minerals (CENTRUM SILVER ADULT 50+ PO) Take 1 tablet by mouth daily.  . Omega-3 Fatty Acids (FISH OIL PO) Take by mouth.  . oxybutynin (DITROPAN) 5 MG tablet TK 1 T PO Q 8 H PRF URINARY FREQUENCY OR URGENCY  .  rosuvastatin (CRESTOR) 10 MG tablet Take 2 tablets on Monday, Wednesday and Friday and one tablet all other days.  Marland Kitchen terbinafine (LAMISIL) 250 MG tablet Take 1 tablet (250 mg total) by mouth daily.  Marland Kitchen triamcinolone cream (KENALOG) 0.1 % Apply topically 2 (two) times daily.   No facility-administered encounter medications on file as of 01/09/2020.    Allergies (verified) Hydrocodone and Percocet [oxycodone-acetaminophen]   History: Past Medical History:  Diagnosis Date  . Arthritis   . Diverticulosis, sigmoid   . Environmental allergies   . Frequency of urination   . H/O sleep apnea    S/P  OSA surgery -- resolved  . Hemorrhoids   . History of BPH   . History of melanoma excision    back  . History of nephritis    age 68  . History of squamous cell carcinoma excision    right arm, leg, face  . History of urinary retention    secondary bph with obstruction  . Melanoma ([place])   . Urethral stricture    Past Surgical History:  Procedure Laterality Date  . COLONOSCOPY WITH PROPOFOL N/A 06/13/2017   Procedure: COLONOSCOPY WITH PROPOFOL;  Surgeon: Lollie Sails, MD;  Location: [facility] ENDOSCOPY;  Service: Endoscopy;  Laterality: N/A;  . CYSTOSCOPY WITH RETROGRADE URETHROGRAM N/A 05/08/2014   Procedure: CYSTOSCOPY WITH RETROGRADE URETHROGRAM;  Surgeon: Alexis Frock, MD;  Location[facility];  Service:  Urology;  Laterality: N/A;  . CYSTOSCOPY WITH URETHRAL DILATATION N/A 05/08/2014   Procedure: CYSTOSCOPY WITH URETHRAL DILATATION;  Surgeon: Alexis Frock, MD;  Location: [facility];  Service: Urology;  Laterality: N/A;  . KNEE ARTHROSCOPY W/ MENISCECTOMY Right x2   last one 2005  . LAPAROSCOPIC INGUINAL HERNIA REPAIR Right 2007 (approx)  . ORIF  RIGHT WRIST FX  2008   retained hardware  . ROBOT ASSISTED LAPAROSCOPIC RADICAL PROSTATECTOMY N/A 03/19/2014   Procedure: ROBOTIC ASSISTED LAPAROSCOPIC SIMPLE PROSTATECTOMY;  Surgeon: Alexis Frock, MD;   Location: [facility] ORS;  Service: Urology;  Laterality: N/A;  . TONSILLECTOMY  age 41  . UVULOPALATOPHARYNGOPLASTY  x5  last one 1995   Family History  Problem Relation Age of Onset  . Heart disease Father        CABG x 4  . Arthritis Father   . Glaucoma Father   . Hepatitis C Mother   . Thyroid disease Sister    Social History   Socioeconomic History  . Marital status: Married    Spouse name: Not on file  . Number of children: Not on file  . Years of education: Not on file  . Highest education level: Not on file  Occupational History  . Not on file  Tobacco Use  . Smoking status: Never Smoker  . Smokeless tobacco: Never Used  Substance and Sexual Activity  . Alcohol use: Yes    Alcohol/week: 0.0 standard drinks    Comment: rare  . Drug use: No  . Sexual activity: Not on file  Other Topics Concern  . Not on file  Social History Narrative  . Not on file   Social Determinants of Health   Financial Resource Strain: Low Risk   . Difficulty of Paying Living Expenses: Not hard at all  Food Insecurity: No Food Insecurity  . Worried About Charity fundraiser in the Last Year: Never true  . Ran Out of Food in the Last Year: Never true  Transportation Needs: No Transportation Needs  . Lack of Transportation (Medical): No  . Lack of Transportation (Non-Medical): No  Physical Activity: Sufficiently Active  . Days of Exercise per Week: 3 days  . Minutes of Exercise per Session: 60 min  Stress: No Stress Concern Present  . Feeling of Stress : Not at all  Social Connections: Unknown  . Frequency of Communication with Friends and Family: Not on file  . Frequency of Social Gatherings with Friends and Family: More than three times a week  . Attends Religious Services: Not on file  . Active Member of Clubs or Organizations: Not on file  . Attends Archivist Meetings: Not on file  . Marital Status: Married    Tobacco Counseling Counseling given: Not Answered   Clinical  Intake:  Pre-visit preparation completed: Yes        Diabetes: No  How often do you need to have someone help you when you read instructions, pamphlets, or other written materials from your doctor or pharmacy?: 1 - Never   Interpreter Needed?: No      Activities of Daily Living In your present state of health, do you have any difficulty performing the following activities: 01/09/2020  Hearing? N  Vision? N  Difficulty concentrating or making decisions? N  Walking or climbing stairs? N  Dressing or bathing? N  Doing errands, shopping? N  Preparing Food and eating ? N  Using the Toilet? N  In the past six months,  have you accidently leaked urine? N  Do you have problems with loss of bowel control? N  Managing your Medications? N  Managing your Finances? N  Housekeeping or managing your Housekeeping? N  Some recent data might be hidden    Patient Care Team: Einar Pheasant, MD as PCP - General (Internal Medicine)  Indicate any recent Medical Services you may have received from other than [facility] providers in the past year (date may be approximate).     Assessment:   This is a routine wellness examination for Kyle Meadows.  I connected with Anay today by telephone and verified that I am speaking with the correct person using two identifiers. Location patient: home Location provider: work Persons participating in the virtual visit: patient, Marine scientist.    I discussed the limitations, risks, security and privacy concerns of performing an evaluation and management service by telephone and the availability of in person appointments. The patient expressed understanding and verbally consented to this telephonic visit.    Interactive audio and video telecommunications were attempted between this provider and patient, however failed, due to patient having technical difficulties OR patient did not have access to video capability.  We continued and completed visit with audio only.  Some vital  signs may be absent or patient reported.   Hearing/Vision screen  Hearing Screening   125Hz  250Hz  500Hz  1000Hz  2000Hz  3000Hz  4000Hz  6000Hz  8000Hz   Right ear:           Left ear:           Comments: Patient is able to hear conversational tones without difficulty.  No issues reported.   Dietary issues and exercise activities discussed: Current Exercise Habits: Home exercise routine, Type of exercise: walking;calisthenics (golfing), Time (Minutes): 60, Frequency (Times/Week): 3, Weekly Exercise (Minutes/Week): 180, Intensity: Moderate  Healthy diet Good water intake  Goals      Patient Stated   .  Weight (lb) < 160 lb (72.6 kg) (pt-stated)      Weight goal 155lb -160lb      Depression Screen PHQ 2/9 Scores 01/09/2020 05/20/2019 01/08/2019 11/13/2018 01/01/2018 12/27/2016 06/09/2016  PHQ - 2 Score 0 0 0 0 0 0 0  PHQ- 9 Score - - 0 0 - - -    Fall Risk Fall Risk  01/09/2020 05/20/2019 01/08/2019 11/13/2018 01/01/2018  Falls in the past year? 0 0 0 0 No  Number falls in past yr: 0 - - - -  Follow up Falls evaluation completed - - - -   Handrails in use when climbing stairs? Yes Home free of loose throw rugs in walkways, pet beds, electrical cords, etc? Yes  Adequate lighting in your home to reduce risk of falls? Yes   ASSISTIVE DEVICES UTILIZED TO PREVENT FALLS: Life alert? No  Use of a cane, walker or w/c? No   TIMED UP AND GO: Was the test performed? No .  Virtual visit.   Cognitive Function: MMSE - Mini Mental State Exam 01/01/2018 12/27/2016  Orientation to time 5 5  Orientation to Place 5 5  Registration 3 3  Attention/ Calculation 5 5  Recall 3 3  Language- name 2 objects 2 2  Language- repeat 1 1  Language- follow 3 step command 3 3  Language- read & follow direction 1 1  Write a sentence 1 1  Copy design 1 1  Total score 30 30     6CIT Screen 01/08/2019  What Year? 0 points  What month? 0 points  What time?  0 points  Count back from 20 0 points  Months in reverse 0  points  Repeat phrase 0 points  Total Score 0    Immunizations Immunization History  Administered Date(s) Administered  . Fluad Quad(high Dose 65+) 01/25/2019  . Influenza Whole 01/28/2009, 02/09/2010, 01/24/2016  . Influenza, High Dose Seasonal PF 12/27/2016, 01/01/2018  . Influenza,inj,Quad PF,6+ Mos 01/28/2014  . PFIZER SARS-COV-2 Vaccination 05/25/2019, 06/15/2019  . Pneumococcal Conjugate-13 06/09/2016  . Pneumococcal Polysaccharide-23 04/16/2012  . Tdap 01/01/2004, 02/02/2016    Health Maintenance There are no preventive care reminders to display for this patient. Health Maintenance  Topic Date Due  . INFLUENZA VACCINE  07/23/2020 (Originally 11/24/2019)  . TETANUS/TDAP  02/01/2026  . COLONOSCOPY  06/14/2027  . COVID-19 Vaccine  Completed  . Hepatitis C Screening  Completed  . PNA vac Low Risk Adult  Completed   Dental Screening: Recommended annual dental exams for proper oral hygiene. Visits every 6 months.   Community Resource Referral / Chronic Care Management: CRR required this visit?  No   CCM required this visit?  No      Plan:   Keep all routine maintenance appointments.   Next scheduled lab 05/25/20  Cpe 05/27/20  I have personally reviewed and noted the following in the patient's chart:   . Medical and social history . Use of alcohol, tobacco or illicit drugs  . Current medications and supplements . Functional ability and status . Nutritional status . Physical activity . Advanced directives . List of other physicians . Hospitalizations, surgeries, and ER visits in previous 12 months . Vitals . Screenings to include cognitive, depression, and falls . Referrals and appointments  In addition, I have reviewed and discussed with patient certain preventive protocols, quality metrics, and best practice recommendations. A written personalized care plan for preventive services as well as general preventive health recommendations were provided to patient  via mychart.     Varney Biles, LPN   7/48/2707    Reviewed above information.  Agree with assessment and plan.   Dr Nicki Reaper

## 2020-01-09 NOTE — Patient Instructions (Addendum)
Mr. Kyle Meadows , Thank you for taking time to come for your Medicare Wellness Visit. I appreciate your ongoing commitment to your health goals. Please review the following plan we discussed and let me know if I can assist you in the future.   These are the goals we discussed: Goals      Patient Stated   .  Weight (lb) < 160 lb (72.6 kg) (pt-stated)      Weight goal 155lb -160lb       This is a list of the screening recommended for you and due dates:  Health Maintenance  Topic Date Due  . Flu Shot  07/23/2020*  . Tetanus Vaccine  02/01/2026  . Colon Cancer Screening  06/14/2027  . COVID-19 Vaccine  Completed  .  Hepatitis C: One time screening is recommended by Center for Disease Control  (CDC) for  adults born from [birth year] through 1965.   Completed  . Pneumonia vaccines  Completed  *Topic was postponed. The date shown is not the original due date.    Immunizations Immunization History  Administered Date(s) Administered  . Fluad Quad(high Dose 65+) 01/25/2019  . Influenza Whole 01/28/2009, 02/09/2010, 01/24/2016  . Influenza, High Dose Seasonal PF 12/27/2016, 01/01/2018  . Influenza,inj,Quad PF,6+ Mos 01/28/2014  . PFIZER SARS-COV-2 Vaccination 05/25/2019, 06/15/2019  . Pneumococcal Conjugate-13 06/09/2016  . Pneumococcal Polysaccharide-23 04/16/2012  . Tdap 01/01/2004, 02/02/2016   Keep all routine maintenance appointments.   Next scheduled lab 05/25/20  Cpe 05/27/20  Advanced directives: yes, on file  Conditions/risks identified: none new.   Follow up in one year for your annual wellness visit.  Preventive Care 30 Years and Older, Male Preventive care refers to lifestyle choices and visits with your health care provider that can promote health and wellness. What does preventive care include?  A yearly physical exam. This is also called an annual well check.  Dental exams once or twice a year.  Routine eye exams. Ask your health care provider how often you should have  your eyes checked.  Personal lifestyle choices, including:  Daily care of your teeth and gums.  Regular physical activity.  Eating a healthy diet.  Avoiding tobacco and drug use.  Limiting alcohol use.  Practicing safe sex.  Taking low doses of aspirin every day.  Taking vitamin and mineral supplements as recommended by your health care provider. What happens during an annual well check? The services and screenings done by your health care provider during your annual well check will depend on your age, overall health, lifestyle risk factors, and family history of disease. Counseling  Your health care provider may ask you questions about your:  Alcohol use.  Tobacco use.  Drug use.  Emotional well-being.  Home and relationship well-being.  Sexual activity.  Eating habits.  History of falls.  Memory and ability to understand (cognition).  Work and work Statistician. Screening  You may have the following tests or measurements:  Height, weight, and BMI.  Blood pressure.  Lipid and cholesterol levels. These may be checked every 5 years, or more frequently if you are over 65 years old.  Skin check.  Lung cancer screening. You may have this screening every year starting at age 76 if you have a 30-pack-year history of smoking and currently smoke or have quit within the past 15 years.  Fecal occult blood test (FOBT) of the stool. You may have this test every year starting at age 57.  Flexible sigmoidoscopy or colonoscopy. You may have a  sigmoidoscopy every 5 years or a colonoscopy every 10 years starting at age 70.  Prostate cancer screening. Recommendations will vary depending on your family history and other risks.  Hepatitis C blood test.  Hepatitis B blood test.  Sexually transmitted disease (STD) testing.  Diabetes screening. This is done by checking your blood sugar (glucose) after you have not eaten for a while (fasting). You may have this done every  1-3 years.  Abdominal aortic aneurysm (AAA) screening. You may need this if you are a current or former smoker.  Osteoporosis. You may be screened starting at age 29 if you are at high risk. Talk with your health care provider about your test results, treatment options, and if necessary, the need for more tests. Vaccines  Your health care provider may recommend certain vaccines, such as:  Influenza vaccine. This is recommended every year.  Tetanus, diphtheria, and acellular pertussis (Tdap, Td) vaccine. You may need a Td booster every 10 years.  Zoster vaccine. You may need this after age 42.  Pneumococcal 13-valent conjugate (PCV13) vaccine. One dose is recommended after age 34.  Pneumococcal polysaccharide (PPSV23) vaccine. One dose is recommended after age 64. Talk to your health care provider about which screenings and vaccines you need and how often you need them. This information is not intended to replace advice given to you by your health care provider. Make sure you discuss any questions you have with your health care provider. Document Released: 05/08/2015 Document Revised: 12/30/2015 Document Reviewed: 02/10/2015 Elsevier Interactive Patient Education  2017 [place] Prevention in the Home Falls can cause injuries. They can happen to people of all ages. There are many things you can do to make your home safe and to help prevent falls. What can I do on the outside of my home?  Regularly fix the edges of walkways and driveways and fix any cracks.  Remove anything that might make you trip as you walk through a door, such as a raised step or threshold.  Trim any bushes or trees on the path to your home.  Use bright outdoor lighting.  Clear any walking paths of anything that might make someone trip, such as rocks or tools.  Regularly check to see if handrails are loose or broken. Make sure that both sides of any steps have handrails.  Any raised decks and  porches should have guardrails on the edges.  Have any leaves, snow, or ice cleared regularly.  Use sand or salt on walking paths during winter.  Clean up any spills in your garage right away. This includes oil or grease spills. What can I do in the bathroom?  Use night lights.  Install grab bars by the toilet and in the tub and shower. Do not use towel bars as grab bars.  Use non-skid mats or decals in the tub or shower.  If you need to sit down in the shower, use a plastic, non-slip stool.  Keep the floor dry. Clean up any water that spills on the floor as soon as it happens.  Remove soap buildup in the tub or shower regularly.  Attach bath mats securely with double-sided non-slip rug tape.  Do not have throw rugs and other things on the floor that can make you trip. What can I do in the bedroom?  Use night lights.  Make sure that you have a light by your bed that is easy to reach.  Do not use any sheets or blankets that are  too big for your bed. They should not hang down onto the floor.  Have a firm chair that has side arms. You can use this for support while you get dressed.  Do not have throw rugs and other things on the floor that can make you trip. What can I do in the kitchen?  Clean up any spills right away.  Avoid walking on wet floors.  Keep items that you use a lot in easy-to-reach places.  If you need to reach something above you, use a strong step stool that has a grab bar.  Keep electrical cords out of the way.  Do not use floor polish or wax that makes floors slippery. If you must use wax, use non-skid floor wax.  Do not have throw rugs and other things on the floor that can make you trip. What can I do with my stairs?  Do not leave any items on the stairs.  Make sure that there are handrails on both sides of the stairs and use them. Fix handrails that are broken or loose. Make sure that handrails are as long as the stairways.  Check any  carpeting to make sure that it is firmly attached to the stairs. Fix any carpet that is loose or worn.  Avoid having throw rugs at the top or bottom of the stairs. If you do have throw rugs, attach them to the floor with carpet tape.  Make sure that you have a light switch at the top of the stairs and the bottom of the stairs. If you do not have them, ask someone to add them for you. What else can I do to help prevent falls?  Wear shoes that:  Do not have high heels.  Have rubber bottoms.  Are comfortable and fit you well.  Are closed at the toe. Do not wear sandals.  If you use a stepladder:  Make sure that it is fully opened. Do not climb a closed stepladder.  Make sure that both sides of the stepladder are locked into place.  Ask someone to hold it for you, if possible.  Clearly mark and make sure that you can see:  Any grab bars or handrails.  First and last steps.  Where the edge of each step is.  Use tools that help you move around (mobility aids) if they are needed. These include:  Canes.  Walkers.  Scooters.  Crutches.  Turn on the lights when you go into a dark area. Replace any light bulbs as soon as they burn out.  Set up your furniture so you have a clear path. Avoid moving your furniture around.  If any of your floors are uneven, fix them.  If there are any pets around you, be aware of where they are.  Review your medicines with your doctor. Some medicines can make you feel dizzy. This can increase your chance of falling. Ask your doctor what other things that you can do to help prevent falls. This information is not intended to replace advice given to you by your health care provider. Make sure you discuss any questions you have with your health care provider. Document Released: 02/05/2009 Document Revised: 09/17/2015 Document Reviewed: 05/16/2014 Elsevier Interactive Patient Education  2017 [employer].

## 2020-01-15 DIAGNOSIS — D2262 Melanocytic nevi of left upper limb, including shoulder: Secondary | ICD-10-CM | POA: Diagnosis not present

## 2020-01-15 DIAGNOSIS — Z85828 Personal history of other malignant neoplasm of skin: Secondary | ICD-10-CM | POA: Diagnosis not present

## 2020-01-15 DIAGNOSIS — D485 Neoplasm of uncertain behavior of skin: Secondary | ICD-10-CM | POA: Diagnosis not present

## 2020-01-15 DIAGNOSIS — X32XXXA Exposure to sunlight, initial encounter: Secondary | ICD-10-CM | POA: Diagnosis not present

## 2020-01-15 DIAGNOSIS — D2261 Melanocytic nevi of right upper limb, including shoulder: Secondary | ICD-10-CM | POA: Diagnosis not present

## 2020-01-15 DIAGNOSIS — Z85820 Personal history of malignant melanoma of skin: Secondary | ICD-10-CM | POA: Diagnosis not present

## 2020-01-15 DIAGNOSIS — L570 Actinic keratosis: Secondary | ICD-10-CM | POA: Diagnosis not present

## 2020-01-15 DIAGNOSIS — D225 Melanocytic nevi of trunk: Secondary | ICD-10-CM | POA: Diagnosis not present

## 2020-01-15 DIAGNOSIS — D2272 Melanocytic nevi of left lower limb, including hip: Secondary | ICD-10-CM | POA: Diagnosis not present

## 2020-02-03 ENCOUNTER — Ambulatory Visit: Payer: PPO | Admitting: Podiatry

## 2020-02-03 ENCOUNTER — Other Ambulatory Visit: Payer: Self-pay

## 2020-02-03 ENCOUNTER — Encounter: Payer: Self-pay | Admitting: Podiatry

## 2020-02-03 DIAGNOSIS — L603 Nail dystrophy: Secondary | ICD-10-CM

## 2020-02-03 MED ORDER — TERBINAFINE HCL 250 MG PO TABS: 250.0000 mg | ORAL_TABLET | Freq: Every day | ORAL | 1 refills | Status: DC

## 2020-02-03 NOTE — Progress Notes (Signed)
Presents today for follow-up of his Lamisil therapy his nails have grown out about 98% and we started him on an every other day Lamisil regimen.  At this point the nail has not grown out any further and it has not improved any more.  Objective: Vital signs are stable he is alert oriented x3 no further improvement of the nail taking 1 tablet every other day of Lamisil.  This very well could be a bad spot in the nail bed or an area of chronic trauma.  Pulses are palpable.  Rate is normal.  Assessment: Long-term therapy onychomycosis with Lamisil.  Plan: At this point were going to increase him back to 1 tablet every day until this is gone or I will follow-up with him in 3 months.  Lamisil 250 mg tablets 1 tablet daily for the next 30 days with 1 refill.

## 2020-02-11 DIAGNOSIS — N35919 Unspecified urethral stricture, male, unspecified site: Secondary | ICD-10-CM | POA: Diagnosis not present

## 2020-02-11 DIAGNOSIS — R3912 Poor urinary stream: Secondary | ICD-10-CM | POA: Diagnosis not present

## 2020-02-11 DIAGNOSIS — R310 Gross hematuria: Secondary | ICD-10-CM | POA: Diagnosis not present

## 2020-02-24 DIAGNOSIS — D225 Melanocytic nevi of trunk: Secondary | ICD-10-CM | POA: Diagnosis not present

## 2020-02-26 ENCOUNTER — Other Ambulatory Visit: Payer: Self-pay | Admitting: Podiatry

## 2020-02-26 NOTE — Telephone Encounter (Signed)
Please advise 

## 2020-04-14 ENCOUNTER — Other Ambulatory Visit: Payer: Self-pay

## 2020-04-14 ENCOUNTER — Ambulatory Visit
Admission: EM | Admit: 2020-04-14 | Discharge: 2020-04-14 | Disposition: A | Discharge status: Home / Self Care | Payer: PPO | Attending: Family Medicine | Admitting: Family Medicine

## 2020-04-14 ENCOUNTER — Encounter: Payer: Self-pay | Admitting: Emergency Medicine

## 2020-04-14 DIAGNOSIS — J0110 Acute frontal sinusitis, unspecified: Secondary | ICD-10-CM

## 2020-04-14 DIAGNOSIS — Z7689 Persons encountering health services in other specified circumstances: Secondary | ICD-10-CM | POA: Diagnosis not present

## 2020-04-14 LAB — POCT RAPID STREP A (OFFICE): Rapid Strep A Screen: NEGATIVE

## 2020-04-14 MED ORDER — AMOXICILLIN-POT CLAVULANATE 875-125 MG PO TABS: 1.0000 | ORAL_TABLET | Freq: Two times a day (BID) | ORAL | 0 refills | Status: DC

## 2020-04-14 NOTE — Discharge Instructions (Addendum)
Continue the allergy medicines  Antibiotics as prescribed  Follow up as needed for continued or worsening symptoms

## 2020-04-14 NOTE — ED Triage Notes (Signed)
Patient c/o sore throat, sneezing, coughing, nasal congestion since last Friday.   Patient denies fever.   Patient has taken OTC cold medication and allergy medication w/ no relief of symptoms.

## 2020-04-14 NOTE — ED Provider Notes (Signed)
Kyle Meadows    CSN: [id number] Arrival date & time: 04/14/20  1014      History   Chief Complaint Chief Complaint  Patient presents with   Nasal Congestion    HPI Kyle Meadows is a 74 y.o. male.   Patient is a 74 year old male past medical history of allergies.  He presents today with complaints of sore throat, sneezing, coughing, nasal congestion, sinus pressure and postnasal drip.  This is been present and unchanged for approximate 1 week.  He takes multiple daily allergy medications.  He has been taking these without any relief.  No fevers, chills     Past Medical History:  Diagnosis Date   Arthritis    Diverticulosis, sigmoid    Environmental allergies    Frequency of urination    H/O sleep apnea    S/P  OSA surgery -- resolved   Hemorrhoids    History of BPH    History of melanoma excision    back   History of nephritis    age 50   History of squamous cell carcinoma excision    right arm, leg, face   History of urinary retention    secondary bph with obstruction   Melanoma ([place])    Urethral stricture     Patient Active Problem List   Diagnosis Date Noted   Light headedness 11/17/2018   Cervical radiculopathy 10/01/2018   Patellar tendonitis 10/01/2018   Osteoarthritis of knee 07/27/2017   Abdominal pain 12/30/2016   Iliac artery aneurysm ([place]) 11/22/2016   Hypercholesterolemia 12/08/2015   Health care maintenance 07/13/2014   Prostatic hyperplasia, benign localized, with obstruction 03/19/2014   Arthritis 10/09/2012   Enlarged prostate 10/09/2012   Environmental allergies 10/09/2012   Skin cancer 10/09/2012   Hemorrhoids 10/09/2012    Past Surgical History:  Procedure Laterality Date   COLONOSCOPY WITH PROPOFOL N/A 06/13/2017   Procedure: COLONOSCOPY WITH PROPOFOL;  Surgeon: Lollie Sails, MD;  Location: [facility] ENDOSCOPY;  Service: Endoscopy;  Laterality: N/A;   CYSTOSCOPY WITH RETROGRADE URETHROGRAM  N/A 05/08/2014   Procedure: CYSTOSCOPY WITH RETROGRADE URETHROGRAM;  Surgeon: Alexis Frock, MD;  [facility];  Service: Urology;  Laterality: N/A;   CYSTOSCOPY WITH URETHRAL DILATATION N/A 05/08/2014   Procedure: CYSTOSCOPY WITH URETHRAL DILATATION;  Surgeon: Alexis Frock, MD;  Location: [facility];  Service: Urology;  Laterality: N/A;   KNEE ARTHROSCOPY W/ MENISCECTOMY Right x2   last one 2005   LAPAROSCOPIC INGUINAL HERNIA REPAIR Right 2007 (approx)   ORIF  RIGHT WRIST FX  2008   retained hardware   ROBOT ASSISTED LAPAROSCOPIC RADICAL PROSTATECTOMY N/A 03/19/2014   Procedure: ROBOTIC ASSISTED LAPAROSCOPIC SIMPLE PROSTATECTOMY;  Surgeon: Alexis Frock, MD;  Location: [facility];  Service: Urology;  Laterality: N/A;   TONSILLECTOMY  age 43   UVULOPALATOPHARYNGOPLASTY  x5  last one 1995       Home Medications    Prior to Admission medications   Medication Sig Start Date End Date Taking? Authorizing Provider  fexofenadine-pseudoephedrine (ALLEGRA-D 24) 180-240 MG 24 hr tablet Take 1 tablet by mouth daily.   Yes [provider]  finasteride (PROSCAR) 5 MG tablet Take 5 mg by mouth daily.   Yes [provider]  ipratropium (ATROVENT) 0.06 % nasal spray SMARTSIG:1-2 Spray(s) Both Nares 3 Times Daily 10/28/19  Yes [provider]  levocetirizine (XYZAL) 5 MG tablet Take 5 mg by mouth at bedtime. 03/07/19  Yes [provider]  meloxicam ([place])  7.5 MG tablet  11/05/18  Yes [provider]  montelukast (SINGULAIR) 10 MG tablet Take 10 mg by mouth at bedtime. 03/30/19  Yes [provider]  rosuvastatin (CRESTOR) 10 MG tablet Take 2 tablets on Monday, Wednesday and Friday and one tablet all other days. 11/25/19  Yes Einar Pheasant, MD  terbinafine (LAMISIL) 250 MG tablet TAKE 1 TABLET BY MOUTH DAILY 02/26/20  Yes Hyatt, Max T, DPM  Alpha-Lipoic Acid 300 MG TABS Take by mouth.    [provider]   amoxicillin-clavulanate (AUGMENTIN) 875-125 MG tablet Take 1 tablet by mouth every 12 (twelve) hours. 04/14/20   Kiyara Bouffard, Tressia Miners A, NP  Boswellia-Glucosamine-Vit D (OSTEO BI-FLEX ONE PER DAY PO) Take by mouth.    [provider]  Coenzyme Q10 (COQ10 PO) Take by mouth.    [provider]  DOCUSATE SODIUM PO Take by mouth.    [provider]  Multiple Vitamins-Minerals (CENTRUM SILVER ADULT 50+ PO) Take 1 tablet by mouth daily.    [provider]  Omega-3 Fatty Acids (FISH OIL PO) Take by mouth.    [provider]  oxybutynin (DITROPAN) 5 MG tablet TK 1 T PO Q 8 H PRF URINARY FREQUENCY OR URGENCY 03/31/15   [provider]  triamcinolone cream (KENALOG) 0.1 % Apply topically 2 (two) times daily. 12/23/19   [provider]    Family History Family History  Problem Relation Age of Onset   Heart disease Father        CABG x 4   Arthritis Father    Glaucoma Father    Hepatitis C Mother    Thyroid disease Sister     Social History Social History   Tobacco Use   Smoking status: Never Smoker   Smokeless tobacco: Never Used  Substance Use Topics   Alcohol use: Yes    Alcohol/week: 0.0 standard drinks    Comment: rare   Drug use: No     Allergies   Hydrocodone and Percocet [oxycodone-acetaminophen]   Review of Systems Review of Systems   Physical Exam Triage Vital Signs ED Triage Vitals  Enc Vitals Group     BP      Pulse      Resp      Temp      Temp src      SpO2      Weight      Height      Head Circumference      Peak Flow      Pain Score      Pain Loc      Pain Edu?      Excl. in [place]?    No data found.  Updated Vital Signs BP 125/76 (BP Location: Left Arm)    Pulse 63    Temp 98.7 F (37.1 C) (Oral)    Resp 18    Ht 5\' 9"  (1.753 m)    Wt 163 lb (73.9 kg)    SpO2 94%    BMI 24.07 kg/m   Visual Acuity Right Eye Distance:   Left Eye Distance:   Bilateral Distance:    Right Eye Near:    Left Eye Near:    Bilateral Near:     Physical Exam Vitals and nursing note reviewed.  Constitutional:      General: He is not in acute distress.    Appearance: Normal appearance. He is not ill-appearing, toxic-appearing or diaphoretic.  HENT:     Head: Normocephalic and  atraumatic.     Right Ear: Tympanic membrane and ear canal normal.     Left Ear: Tympanic membrane and ear canal normal.     Nose: Congestion and rhinorrhea present.     Mouth/Throat:     Pharynx: Oropharynx is clear.  Eyes:     Conjunctiva/sclera: Conjunctivae normal.  Cardiovascular:     Rate and Rhythm: Normal rate and regular rhythm.  Pulmonary:     Effort: Pulmonary effort is normal.     Breath sounds: Normal breath sounds.  Musculoskeletal:        General: Normal range of motion.     Cervical back: Normal range of motion.  Skin:    General: Skin is warm and dry.  Neurological:     Mental Status: He is alert.  Psychiatric:        Mood and Affect: Mood normal.      UC Treatments / Results  Labs (all labs ordered are listed, but only abnormal results are displayed) Labs Reviewed  NOVEL CORONAVIRUS, NAA  CULTURE, GROUP A STREP [facility])  POCT RAPID STREP A (OFFICE)    EKG   Radiology No results found.  Procedures Procedures (including critical care time)  Medications Ordered in UC Medications - No data to display  Initial Impression / Assessment and Plan / UC Course  I have reviewed the triage vital signs and the nursing notes.  Pertinent labs & imaging results that were available during my care of the patient were reviewed by me and considered in my medical decision making (see chart for details).     Sinusitis Based on length of symptoms and taking multiple allergy medicines without any relief we will go ahead and treat for possible sinus infection at this time.  Antibiotics as prescribed.  Continue the allergy medication as prescribed. Recommend follow up with allergist if problem  continues.  Final Clinical Impressions(s) / UC Diagnoses   Final diagnoses:  Acute non-recurrent frontal sinusitis     Discharge Instructions     Continue the allergy medicines  Antibiotics as prescribed  Follow up as needed for continued or worsening symptoms     ED Prescriptions    Medication Sig Dispense Auth. Provider   amoxicillin-clavulanate (AUGMENTIN) 875-125 MG tablet Take 1 tablet by mouth every 12 (twelve) hours. 14 tablet Malik Paar A, NP     PDMP not reviewed this encounter.   Orvan July, NP 04/14/20 1046

## 2020-04-16 LAB — NOVEL CORONAVIRUS, NAA: SARS-CoV-2, NAA: NOT DETECTED

## 2020-04-16 LAB — SARS-COV-2, NAA 2 DAY TAT

## 2020-04-17 LAB — CULTURE, GROUP A STREP (THRC)

## 2020-05-06 ENCOUNTER — Encounter: Payer: Self-pay | Admitting: Podiatry

## 2020-05-06 ENCOUNTER — Ambulatory Visit: Payer: PPO | Admitting: Podiatry

## 2020-05-06 ENCOUNTER — Other Ambulatory Visit: Payer: Self-pay

## 2020-05-06 DIAGNOSIS — L603 Nail dystrophy: Secondary | ICD-10-CM | POA: Diagnosis not present

## 2020-05-06 MED ORDER — TERBINAFINE HCL 250 MG PO TABS: 250.0000 mg | ORAL_TABLET | Freq: Every day | ORAL | 0 refills | Status: DC

## 2020-05-06 NOTE — Progress Notes (Signed)
He presents today for follow-up of his Lamisil therapy.  States that he is doing quite well with it.  He denies fever chills nausea vomiting muscle aches pains calf pain back pain chest pain shortness of breath.  States that his toenail appears to be growing out very nicely as he refers to the hallux left.  Objective: Vital signs are stable alert and oriented x3.  Pulses are palpable.  Nail plate appears to be improving very nicely and growing out much better than it was previously.  Assessment: Well-healing onychomycosis long-term therapy with Lamisil.  Plan: At this point we are going to continue our 1 tablet daily for the next month and then he will go to 1 tablet every other day for 2 months and I will follow-up with him in 3 months

## 2020-05-22 ENCOUNTER — Ambulatory Visit
Admission: EM | Admit: 2020-05-22 | Discharge: 2020-05-22 | Disposition: A | Discharge status: Home / Self Care | Payer: PPO

## 2020-05-22 ENCOUNTER — Other Ambulatory Visit: Payer: Self-pay

## 2020-05-22 DIAGNOSIS — I723 Aneurysm of iliac artery: Secondary | ICD-10-CM

## 2020-05-22 DIAGNOSIS — M79652 Pain in left thigh: Secondary | ICD-10-CM

## 2020-05-22 DIAGNOSIS — M545 Low back pain, unspecified: Secondary | ICD-10-CM

## 2020-05-22 DIAGNOSIS — M79651 Pain in right thigh: Secondary | ICD-10-CM

## 2020-05-22 NOTE — Discharge Instructions (Addendum)
I do recommend that you go to the hospital now due to the new onset of your pain without injury, given known bilateral iliac artery aneurysms which have not been measured since 2019.

## 2020-05-22 NOTE — ED Triage Notes (Signed)
Pt reports having bila leg pain "hamstring". sts the pain starts in lower back and radiates down leg. Tried using heat on legs. sts pain is worse when standing.

## 2020-05-22 NOTE — ED Provider Notes (Signed)
Kyle Meadows    CSN: [id number] Arrival date & time: 05/22/20  1246      History   Chief Complaint Chief Complaint  Patient presents with  . Leg Pain    HPI Kyle Meadows is a 75 y.o. male.   Kyle Meadows presents with complaints of bilateral posterior thigh pain which started a few days ago. He notes it at it's worst when he wakes up. states it takes most of the morning before pain does improve some. Now, when he stands up the pain worsens again.  Some tingling to popliteal space bilaterally. No numbness. No injury to back or legs. No recent bending/ heavy lifting.  No previous similar. No history of back pain. The pain radiates from bilateral popliteal space up posterior thighs, to buttocks as well as to low back. No back injury. No feet numbness tingling coolness or swelling. History of bilateral iliac aortic aneurysm, he had been following with vascular surgery for this, last in 2019. The aneurysms had been stable per chart review, patient states he had not returned due to the stability as it felt like wasted visits.     ROS per HPI, negative if not otherwise mentioned.      Past Medical History:  Diagnosis Date  . Arthritis   . Diverticulosis, sigmoid   . Environmental allergies   . Frequency of urination   . H/O sleep apnea    S/P  OSA surgery -- resolved  . Hemorrhoids   . History of BPH   . History of melanoma excision    back  . History of nephritis    age 45  . History of squamous cell carcinoma excision    right arm, leg, face  . History of urinary retention    secondary bph with obstruction  . Melanoma ([place])   . Urethral stricture     Patient Active Problem List   Diagnosis Date Noted  . Light headedness 11/17/2018  . Cervical radiculopathy 10/01/2018  . Patellar tendonitis 10/01/2018  . Osteoarthritis of knee 07/27/2017  . Abdominal pain 12/30/2016  . Iliac artery aneurysm ([place]) 11/22/2016  . Hypercholesterolemia 12/08/2015  . Health  care maintenance 07/13/2014  . Prostatic hyperplasia, benign localized, with obstruction 03/19/2014  . Arthritis 10/09/2012  . Enlarged prostate 10/09/2012  . Environmental allergies 10/09/2012  . Skin cancer 10/09/2012  . Hemorrhoids 10/09/2012    Past Surgical History:  Procedure Laterality Date  . COLONOSCOPY WITH PROPOFOL N/A 06/13/2017   Procedure: COLONOSCOPY WITH PROPOFOL;  Surgeon: Lollie Sails, MD;  Location: [facility] ENDOSCOPY;  Service: Endoscopy;  Laterality: N/A;  . CYSTOSCOPY WITH RETROGRADE URETHROGRAM N/A 05/08/2014   Procedure: CYSTOSCOPY WITH RETROGRADE URETHROGRAM;  Surgeon: Alexis Frock, MD;  Location[facility];  Service: Urology;  Laterality: N/A;  . CYSTOSCOPY WITH URETHRAL DILATATION N/A 05/08/2014   Procedure: CYSTOSCOPY WITH URETHRAL DILATATION;  Surgeon: Alexis Frock, MD;  Location: [facility];  Service: Urology;  Laterality: N/A;  . KNEE ARTHROSCOPY W/ MENISCECTOMY Right x2   last one 2005  . LAPAROSCOPIC INGUINAL HERNIA REPAIR Right 2007 (approx)  . ORIF  RIGHT WRIST FX  2008   retained hardware  . ROBOT ASSISTED LAPAROSCOPIC RADICAL PROSTATECTOMY N/A 03/19/2014   Procedure: ROBOTIC ASSISTED LAPAROSCOPIC SIMPLE PROSTATECTOMY;  Surgeon: Alexis Frock, MD;  Location: [facility];  Service: Urology;  Laterality: N/A;  . TONSILLECTOMY  age 1  . UVULOPALATOPHARYNGOPLASTY  x5  last one 1995  Home Medications    Prior to Admission medications   Medication Sig Start Date End Date Taking? Authorizing Provider  Alpha-Lipoic Acid 300 MG TABS Take by mouth.    [provider]  amoxicillin-clavulanate (AUGMENTIN) 875-125 MG tablet Take 1 tablet by mouth every 12 (twelve) hours. 04/14/20   Bast, Tressia Miners A, NP  Boswellia-Glucosamine-Vit D (OSTEO BI-FLEX ONE PER DAY PO) Take by mouth.    [provider]  Coenzyme Q10 (COQ10 PO) Take by mouth.    [provider]  DOCUSATE SODIUM PO Take by mouth.     [provider]  fexofenadine-pseudoephedrine (ALLEGRA-D 24) 180-240 MG 24 hr tablet Take 1 tablet by mouth daily.    [provider]  finasteride (PROSCAR) 5 MG tablet Take 5 mg by mouth daily.    [provider]  ipratropium (ATROVENT) 0.06 % nasal spray SMARTSIG:1-2 Spray(s) Both Nares 3 Times Daily 10/28/19   [provider]  levocetirizine (XYZAL) 5 MG tablet Take 5 mg by mouth at bedtime. 03/07/19   [provider]  meloxicam ([place]) 7.5 MG tablet  11/05/18   [provider]  montelukast (SINGULAIR) 10 MG tablet Take 10 mg by mouth at bedtime. 03/30/19   [provider]  Multiple Vitamins-Minerals (CENTRUM SILVER ADULT 50+ PO) Take 1 tablet by mouth daily.    [provider]  Omega-3 Fatty Acids (FISH OIL PO) Take by mouth.    [provider]  oxybutynin (DITROPAN) 5 MG tablet TK 1 T PO Q 8 H PRF URINARY FREQUENCY OR URGENCY 03/31/15   [provider]  rosuvastatin (CRESTOR) 10 MG tablet Take 2 tablets on Monday, Wednesday and Friday and one tablet all other days. 11/25/19   Einar Pheasant, MD  terbinafine (LAMISIL) 250 MG tablet TAKE 1 TABLET BY MOUTH DAILY 02/26/20   Hyatt, Max T, DPM  terbinafine (LAMISIL) 250 MG tablet Take 1 tablet (250 mg total) by mouth daily. 05/06/20   Hyatt, Max T, DPM  triamcinolone cream (KENALOG) 0.1 % Apply topically 2 (two) times daily. 12/23/19   [provider]    Family History Family History  Problem Relation Age of Onset  . Heart disease Father        CABG x 4  . Arthritis Father   . Glaucoma Father   . Hepatitis C Mother   . Thyroid disease Sister     Social History Social History   Tobacco Use  . Smoking status: Never Smoker  . Smokeless tobacco: Never Used  Substance Use Topics  . Alcohol use: Yes    Alcohol/week: 0.0 standard drinks    Comment: rare  . Drug use: No     Allergies   Hydrocodone and Percocet  [oxycodone-acetaminophen]   Review of Systems Review of Systems   Physical Exam Triage Vital Signs ED Triage Vitals  Enc Vitals Group     BP 05/22/20 1256 138/83     Pulse Rate 05/22/20 1256 66     Resp 05/22/20 1256 16     Temp 05/22/20 1256 98.8 F (37.1 C)     Temp Source 05/22/20 1256 Oral     SpO2 05/22/20 1256 98 %     Weight 05/22/20 1257 161 lb (73 kg)     Height 05/22/20 1257 5\' 9"  (1.753 m)     Head Circumference --      Peak Flow --      Pain Score 05/22/20 1257 3     Pain Loc --  Pain Edu? --      Excl. in [place]? --    No data found.  Updated Vital Signs BP 138/83   Pulse 66   Temp 98.8 F (37.1 C) (Oral)   Resp 16   Ht 5\' 9"  (1.753 m)   Wt 161 lb (73 kg)   SpO2 98%   BMI 23.78 kg/m   Visual Acuity Right Eye Distance:   Left Eye Distance:   Bilateral Distance:    Right Eye Near:   Left Eye Near:    Bilateral Near:     Physical Exam Constitutional:      Appearance: He is well-developed.  Cardiovascular:     Rate and Rhythm: Normal rate.  Pulmonary:     Effort: Pulmonary effort is normal.  Musculoskeletal:     Right upper leg: No tenderness or bony tenderness.     Left upper leg: No tenderness or bony tenderness.     Comments: No tenderness to low back on palpation, he indicates right low back is worse pain than left low back; some pain to low back with flexion and extension of back such as with sitting up on exam table; no posterior thigh pain with trigger of hamstring or with knee flexion or extension, with full ROM; pain to posterior thighs originating at popliteal space when he let his legs hang dependent while flexed at knee, after sitting up from laying on exam table; worse again with standing after sitting; while standing he feels posterior thigh pain and some low back pain; gross sensation intact; feet with full sensation; ambulatory without difficulty   Skin:    General: Skin is warm and dry.  Neurological:     Mental Status: He is  alert and oriented to person, place, and time.      UC Treatments / Results  Labs (all labs ordered are listed, but only abnormal results are displayed) Labs Reviewed - No data to display  EKG   Radiology No results found.  Procedures Procedures (including critical care time)  Medications Ordered in UC Medications - No data to display  Initial Impression / Assessment and Plan / UC Course  I have reviewed the triage vital signs and the nursing notes.  Pertinent labs & imaging results that were available during my care of the patient were reviewed by me and considered in my medical decision making (see chart for details).     Sciatica? No known injury or specific trigger, no previous similar. Pain to bilateral popliteal and up posterior thighs. Worse when legs are dependent. Known iliac aortic aneursyms which haven't been evaluated since 2018. At this time feels that he likely needs ultrasound to evaluate these to ensure these are not source of discomfort. Recommend ER visit now for further evaluation. Vitals stable. Patient verbalized understanding and agreeable to plan.  Patient understands risks of not seeking evaluation for this, including death or loss of limb. Self transporting now.   Final Clinical Impressions(s) / UC Diagnoses   Final diagnoses:  Bilateral thigh pain  Acute bilateral low back pain without sciatica  Bilateral iliac artery aneurysm [facility])     Discharge Instructions     I do recommend that you go to the hospital now due to the new onset of your pain without injury, given known bilateral iliac artery aneurysms which have not been measured since 2019.     ED Prescriptions    None     PDMP not reviewed this encounter.   Augusto Gamble  B, NP 05/22/20 1352

## 2020-05-24 DIAGNOSIS — M5442 Lumbago with sciatica, left side: Secondary | ICD-10-CM | POA: Diagnosis not present

## 2020-05-24 DIAGNOSIS — M5441 Lumbago with sciatica, right side: Secondary | ICD-10-CM | POA: Diagnosis not present

## 2020-05-25 ENCOUNTER — Other Ambulatory Visit (INDEPENDENT_AMBULATORY_CARE_PROVIDER_SITE_OTHER): Payer: PPO

## 2020-05-25 ENCOUNTER — Other Ambulatory Visit: Payer: Self-pay

## 2020-05-25 DIAGNOSIS — Z125 Encounter for screening for malignant neoplasm of prostate: Secondary | ICD-10-CM | POA: Diagnosis not present

## 2020-05-25 DIAGNOSIS — E7800 Pure hypercholesterolemia, unspecified: Secondary | ICD-10-CM

## 2020-05-25 LAB — LIPID PANEL
Cholesterol: 153 mg/dL (ref 0–200)
HDL: 43.5 mg/dL (ref >39.00)
LDL Cholesterol: 96 mg/dL (ref 0–99)
NonHDL: 109.46
Total CHOL/HDL Ratio: 4
Triglycerides: 67 mg/dL (ref 0.0–149.0)
VLDL: 13.4 mg/dL (ref 0.0–40.0)

## 2020-05-25 LAB — HEPATIC FUNCTION PANEL
ALT: 13 U/L (ref 0–53)
AST: 17 U/L (ref 0–37)
Albumin: 4.5 g/dL (ref 3.5–5.2)
Alkaline Phosphatase: 54 U/L (ref 39–117)
Bilirubin, Direct: 0.1 mg/dL (ref 0.0–0.3)
Total Bilirubin: 0.5 mg/dL (ref 0.2–1.2)
Total Protein: 6.5 g/dL (ref 6.0–8.3)

## 2020-05-25 LAB — PSA, MEDICARE: PSA: 1.29 ng/ml (ref 0.10–4.00)

## 2020-05-25 LAB — BASIC METABOLIC PANEL
BUN: 22 mg/dL (ref 6–23)
CO2: 33 mEq/L — ABNORMAL HIGH (ref 19–32)
Calcium: 9.6 mg/dL (ref 8.4–10.5)
Chloride: 105 mEq/L (ref 96–112)
Creatinine, Ser: 1.06 mg/dL (ref 0.40–1.50)
GFR: 69.16 mL/min (ref >60.00)
Glucose, Bld: 91 mg/dL (ref 70–99)
Potassium: 4 mEq/L (ref 3.5–5.1)
Sodium: 141 mEq/L (ref 135–145)

## 2020-05-27 ENCOUNTER — Ambulatory Visit (INDEPENDENT_AMBULATORY_CARE_PROVIDER_SITE_OTHER): Payer: PPO

## 2020-05-27 ENCOUNTER — Telehealth: Payer: Self-pay

## 2020-05-27 ENCOUNTER — Encounter: Payer: Self-pay | Admitting: Internal Medicine

## 2020-05-27 ENCOUNTER — Other Ambulatory Visit: Payer: Self-pay

## 2020-05-27 ENCOUNTER — Ambulatory Visit (INDEPENDENT_AMBULATORY_CARE_PROVIDER_SITE_OTHER): Payer: PPO | Admitting: Internal Medicine

## 2020-05-27 VITALS — BP 128/70 | HR 76 | Temp 98.0°F | Resp 16 | Ht 69.0 in | Wt 169.0 lb

## 2020-05-27 DIAGNOSIS — I723 Aneurysm of iliac artery: Secondary | ICD-10-CM | POA: Diagnosis not present

## 2020-05-27 DIAGNOSIS — Z9109 Other allergy status, other than to drugs and biological substances: Secondary | ICD-10-CM

## 2020-05-27 DIAGNOSIS — Z0000 Encounter for general adult medical examination without abnormal findings: Secondary | ICD-10-CM | POA: Diagnosis not present

## 2020-05-27 DIAGNOSIS — M5441 Lumbago with sciatica, right side: Secondary | ICD-10-CM | POA: Diagnosis not present

## 2020-05-27 DIAGNOSIS — E7800 Pure hypercholesterolemia, unspecified: Secondary | ICD-10-CM

## 2020-05-27 DIAGNOSIS — M545 Low back pain, unspecified: Secondary | ICD-10-CM | POA: Diagnosis not present

## 2020-05-27 DIAGNOSIS — M5442 Lumbago with sciatica, left side: Secondary | ICD-10-CM | POA: Diagnosis not present

## 2020-05-27 MED ORDER — METHYLPREDNISOLONE 4 MG PO TBPK: ORAL_TABLET | ORAL | 0 refills | Status: DC

## 2020-05-27 NOTE — Progress Notes (Signed)
[name], male   DOB: April 24, 1946, 75 y.o.   MRN: [id number]   Subjective:    [name], male    DOB: Aug 22, 1945, 75 y.o.   MRN: [id number]  HPI This visit occurred during the SARS-CoV-2 public health emergency.  Safety protocols were in place, including screening questions prior to the visit, additional usage of staff PPE, and extensive cleaning of exam room while observing appropriate contact time as indicated for disinfecting solutions.  Patient here for his physical.  Follow up regarding his cholesterol. Has been having increased low back pain. Was seen at urgent care 05/22/20 - bilateral posterior thigh pain.  Pain - low back into buttock and posterior thigh - down to popliteal region.  Placed on 15mg  meloxicam.  Persistent pain.  Reevaluated 05/24/20 [facility].  Has been taking meloxicam, methocarbamol and using heat and icy hot.  Also - stretches.  No numbness or tingling.  Worse when stands up.  No chest pain or sob reported.  No abdominal pain or bowel change reported.    Past Medical History:  Diagnosis Date  . Arthritis   . Diverticulosis, sigmoid   . Environmental allergies   . Frequency of urination   . H/O sleep apnea    S/P  OSA surgery -- resolved  . Hemorrhoids   . History of BPH   . History of melanoma excision    back  . History of nephritis    age 39  . History of squamous cell carcinoma excision    right arm, leg, face  . History of urinary retention    secondary bph with obstruction  . Melanoma ([place])   . Urethral stricture    Past Surgical History:  Procedure Laterality Date  . COLONOSCOPY WITH PROPOFOL N/A 06/13/2017   Procedure: COLONOSCOPY WITH PROPOFOL;  Surgeon: Lollie Sails, MD;  Location: [facility] ENDOSCOPY;  Service: Endoscopy;  Laterality: N/A;  . CYSTOSCOPY WITH RETROGRADE URETHROGRAM N/A 05/08/2014   Procedure: CYSTOSCOPY WITH RETROGRADE URETHROGRAM;  Surgeon: Alexis Frock, MD;  Location: [facility];  Service: Urology;  Laterality: N/A;  . CYSTOSCOPY WITH URETHRAL DILATATION N/A 05/08/2014   Procedure: CYSTOSCOPY WITH URETHRAL DILATATION;  Surgeon: Alexis Frock, MD;  Location: [facility];  Service: Urology;  Laterality: N/A;  . KNEE ARTHROSCOPY W/ MENISCECTOMY Right x2   last one 2005  . LAPAROSCOPIC INGUINAL HERNIA REPAIR Right 2007 (approx)  . ORIF  RIGHT WRIST FX  2008   retained hardware  . ROBOT ASSISTED LAPAROSCOPIC RADICAL PROSTATECTOMY N/A 03/19/2014   Procedure: ROBOTIC ASSISTED LAPAROSCOPIC SIMPLE PROSTATECTOMY;  Surgeon: Alexis Frock, MD;  Location: [facility];  Service: Urology;  Laterality: N/A;  . TONSILLECTOMY  age 50  . UVULOPALATOPHARYNGOPLASTY  x5  last one 1995   Family History  Problem Relation Age of Onset  . Heart disease Father        CABG x 4  . Arthritis Father   . Glaucoma Father   . Hepatitis C Mother   . Thyroid disease Sister    Social History   Socioeconomic History  . Marital status: Married    Spouse name: Not on file  . Number of children: Not on file  . Years of education: Not on file  . Highest education level: Not on file  Occupational History  . Not on file  Tobacco Use  . Smoking status: Never Smoker  . Smokeless tobacco: Never Used  Substance and  Sexual Activity  . Alcohol use: Yes    Alcohol/week: 0.0 standard drinks    Comment: rare  . Drug use: No  . Sexual activity: Not on file  Other Topics Concern  . Not on file  Social History Narrative  . Not on file   Social Determinants of Health   Financial Resource Strain: Low Risk   . Difficulty of Paying Living Expenses: Not hard at all  Food Insecurity: No Food Insecurity  . Worried About Charity fundraiser in the Last Year: Never true  . Ran Out of Food in the Last Year: Never true  Transportation Needs: No Transportation Needs  . Lack of Transportation (Medical): No  . Lack of Transportation (Non-Medical): No  Physical Activity:  Sufficiently Active  . Days of Exercise per Week: 3 days  . Minutes of Exercise per Session: 60 min  Stress: No Stress Concern Present  . Feeling of Stress : Not at all  Social Connections: Unknown  . Frequency of Communication with Friends and Family: Not on file  . Frequency of Social Gatherings with Friends and Family: More than three times a week  . Attends Religious Services: Not on file  . Active Member of Clubs or Organizations: Not on file  . Attends Archivist Meetings: Not on file  . Marital Status: Married    Outpatient Encounter Medications as of 05/27/2020  Medication Sig  . methylPREDNISolone (MEDROL DOSEPAK) 4 MG TBPK tablet Medrol dose pack - 6 day taper  . Alpha-Lipoic Acid 300 MG TABS Take by mouth.  . [place]-Glucosamine-Vit D (OSTEO BI-FLEX ONE PER DAY PO) Take by mouth.  . Coenzyme Q10 (COQ10 PO) Take by mouth.  . DOCUSATE SODIUM PO Take by mouth.  . fexofenadine-pseudoephedrine (ALLEGRA-D 24) 180-240 MG 24 hr tablet Take 1 tablet by mouth daily.  . finasteride (PROSCAR) 5 MG tablet Take 5 mg by mouth daily.  Marland Kitchen ipratropium (ATROVENT) 0.06 % nasal spray SMARTSIG:1-2 Spray(s) Both Nares 3 Times Daily  . levocetirizine (XYZAL) 5 MG tablet Take 5 mg by mouth at bedtime.  . meloxicam (MOBIC) 7.5 MG tablet   . montelukast (SINGULAIR) 10 MG tablet Take 10 mg by mouth at bedtime.  . Multiple Vitamins-Minerals (CENTRUM SILVER ADULT 50+ PO) Take 1 tablet by mouth daily.  . Omega-3 Fatty Acids (FISH OIL PO) Take by mouth.  . oxybutynin (DITROPAN) 5 MG tablet TK 1 T PO Q 8 H PRF URINARY FREQUENCY OR URGENCY  . rosuvastatin (CRESTOR) 10 MG tablet Take 2 tablets on Monday, Wednesday and Friday and one tablet all other days.  Marland Kitchen terbinafine (LAMISIL) 250 MG tablet Take 1 tablet (250 mg total) by mouth daily.  Marland Kitchen triamcinolone cream (KENALOG) 0.1 % Apply topically 2 (two) times daily.  . [DISCONTINUED] amoxicillin-clavulanate (AUGMENTIN) 875-125 MG tablet Take 1 tablet  by mouth every 12 (twelve) hours.  . [DISCONTINUED] terbinafine (LAMISIL) 250 MG tablet TAKE 1 TABLET BY MOUTH DAILY   No facility-administered encounter medications on file as of 05/27/2020.    Review of Systems  Constitutional: Negative for appetite change and unexpected weight change.  HENT: Negative for congestion, sinus pressure and sore throat.   Eyes: Negative for pain and visual disturbance.  Respiratory: Negative for cough, chest tightness and shortness of breath.   Cardiovascular: Negative for chest pain, palpitations and leg swelling.  Gastrointestinal: Negative for abdominal pain, diarrhea, nausea and vomiting.  Genitourinary: Negative for difficulty urinating and dysuria.  Musculoskeletal: Positive for back pain. Negative for  joint swelling and myalgias.  Skin: Negative for color change and rash.  Neurological: Negative for dizziness, light-headedness and headaches.  Hematological: Negative for adenopathy. Does not bruise/bleed easily.  Psychiatric/Behavioral: Negative for decreased concentration and dysphoric mood.       Objective:    Physical Exam Vitals reviewed.  Constitutional:      General: He is not in acute distress.    Appearance: Normal appearance. He is well-developed and well-nourished.  HENT:     Head: Normocephalic and atraumatic.     Right Ear: External ear normal.     Left Ear: External ear normal.     Mouth/Throat:     Mouth: Oropharynx is clear and moist.  Eyes:     General: No scleral icterus.       Right eye: No discharge.        Left eye: No discharge.     Conjunctiva/sclera: Conjunctivae normal.  Neck:     Thyroid: No thyromegaly.  Cardiovascular:     Rate and Rhythm: Normal rate and regular rhythm.  Pulmonary:     Effort: No respiratory distress.     Breath sounds: Normal breath sounds. No wheezing.  Abdominal:     General: Bowel sounds are normal.     Palpations: Abdomen is soft.     Tenderness: There is no abdominal tenderness.   Musculoskeletal:        General: No swelling, tenderness or edema.     Cervical back: Neck supple. No tenderness.     Comments: Increased pain with standing. No significant pain with SLR.   Lymphadenopathy:     Cervical: No cervical adenopathy.  Skin:    Findings: No erythema or rash.  Neurological:     Mental Status: He is alert and oriented to person, place, and time.  Psychiatric:        Mood and Affect: Mood and affect and mood normal.        Behavior: Behavior normal.     BP 128/70   Pulse 76   Temp 98 F (36.7 C) (Oral)   Resp 16   Ht 5\' 9"  (1.753 m)   Wt 169 lb (76.7 kg)   SpO2 98%   BMI 24.96 kg/m  Wt Readings from Last 3 Encounters:  05/27/20 169 lb (76.7 kg)  05/22/20 161 lb (73 kg)  04/14/20 163 lb (73.9 kg)     Lab Results  Component Value Date   WBC 6.3 11/22/2019   HGB 14.0 11/22/2019   HCT 41.5 11/22/2019   PLT 179.0 11/22/2019   GLUCOSE 91 05/25/2020   CHOL 153 05/25/2020   TRIG 67.0 05/25/2020   HDL 43.50 05/25/2020   LDLCALC 96 05/25/2020   ALT 13 05/25/2020   AST 17 05/25/2020   NA 141 05/25/2020   K 4.0 05/25/2020   CL 105 05/25/2020   CREATININE 1.06 05/25/2020   BUN 22 05/25/2020   CO2 33 (H) 05/25/2020   TSH 1.56 11/22/2019   PSA 1.29 05/25/2020       Assessment & Plan:   Problem List Items Addressed This Visit    Environmental allergies    Has seen Dr Donneta Romberg.  xyzal and singulair.        Health care maintenance    Physical today 05/27/20.  Urology follows psa.  Colonoscopy 05/2017.  Recommended f/u in 5 years.       Hypercholesterolemia    On crestor.  Low cholesterol diet and exercise.  Follow lipid panel and liver  function tests.        Relevant Orders   Hepatic function panel   Lipid panel   Basic metabolic panel   Iliac artery aneurysm (HCC)    History of bilateral iliac artery aneurysms.  Evaluated by [facility].  Overdue f/u.  Schedule.        Relevant Orders   Ambulatory referral to Vascular Surgery   Low back  pain    Persistent increased low back pain as outlined.  Pain extending into his buttock and down to popliteal region.  Taking meloxicam and robaxin.  Stretches.  Icy hot.  Persistent pain.  Will check xray.  Treat with medrol dose pack. Has an appt with ortho scheduled for later in the month.        Relevant Medications   methylPREDNISolone (MEDROL DOSEPAK) 4 MG TBPK tablet   Other Relevant Orders   DG Lumbar Spine 2-3 Views (Completed)    Other Visit Diagnoses    Routine general medical examination at a health care facility    -  Primary       Einar Pheasant, MD

## 2020-05-27 NOTE — Telephone Encounter (Signed)
Chart corrected

## 2020-05-27 NOTE — Telephone Encounter (Signed)
Pt was checking and he wanted me to let you know that his height is wrong. Pt said he is 5'9 not 6'1.

## 2020-05-31 ENCOUNTER — Encounter: Payer: Self-pay | Admitting: Internal Medicine

## 2020-05-31 NOTE — Assessment & Plan Note (Signed)
Has seen Dr Donneta Romberg.  xyzal and singulair.

## 2020-05-31 NOTE — Assessment & Plan Note (Signed)
On crestor.  Low cholesterol diet and exercise.  Follow lipid panel and liver function tests.   

## 2020-05-31 NOTE — Assessment & Plan Note (Signed)
Physical today 05/27/20.  Urology follows psa.  Colonoscopy 05/2017.  Recommended f/u in 5 years.

## 2020-05-31 NOTE — Assessment & Plan Note (Signed)
History of bilateral iliac artery aneurysms.  Evaluated by [facility].  Overdue f/u.  Schedule.

## 2020-05-31 NOTE — Assessment & Plan Note (Signed)
Persistent increased low back pain as outlined.  Pain extending into his buttock and down to popliteal region.  Taking meloxicam and robaxin.  Stretches.  Icy hot.  Persistent pain.  Will check xray.  Treat with medrol dose pack. Has an appt with ortho scheduled for later in the month.

## 2020-06-01 ENCOUNTER — Encounter: Payer: Self-pay | Admitting: Internal Medicine

## 2020-06-08 DIAGNOSIS — M5416 Radiculopathy, lumbar region: Secondary | ICD-10-CM | POA: Diagnosis not present

## 2020-06-08 DIAGNOSIS — M4316 Spondylolisthesis, lumbar region: Secondary | ICD-10-CM | POA: Diagnosis not present

## 2020-06-09 DIAGNOSIS — M4316 Spondylolisthesis, lumbar region: Secondary | ICD-10-CM | POA: Diagnosis not present

## 2020-06-09 DIAGNOSIS — M5416 Radiculopathy, lumbar region: Secondary | ICD-10-CM | POA: Diagnosis not present

## 2020-06-18 ENCOUNTER — Other Ambulatory Visit (INDEPENDENT_AMBULATORY_CARE_PROVIDER_SITE_OTHER): Payer: Self-pay | Admitting: Vascular Surgery

## 2020-06-18 DIAGNOSIS — I714 Abdominal aortic aneurysm, without rupture, unspecified: Secondary | ICD-10-CM

## 2020-06-21 NOTE — Progress Notes (Signed)
MRN : [id number]  Kyle Meadows is a 75 y.o. ([birth year]) male who presents with chief complaint of No chief complaint on file. Marland Kitchen  History of Present Illness:   The patient presents to the office for evaluation of an abdominal aortic aneurysm. The aneurysm was found incidentally by CT scan. Patient denies abdominal pain or unusual back pain, no other abdominal complaints.  No history of an acute onset of painful blue discoloration of the toes.     No family history of AAA.   Patient denies amaurosis fugax or TIA symptoms. There is no history of claudication or rest pain symptoms of the lower extremities.  The patient denies angina or shortness of breath.  Previous duplex ultrasound from 11/2017 showed: stable internal iliac artery aneurysms measuring 2.0 cm in the right and 1.9 cm on the left.  Biphasic blood flow throughout the aortoiliac system.  No outpatient medications have been marked as taking for the 06/22/20 encounter (Appointment) with Delana Meyer, Dolores Lory, MD.    Past Medical History:  Diagnosis Date  . Arthritis   . Diverticulosis, sigmoid   . Environmental allergies   . Frequency of urination   . H/O sleep apnea    S/P  OSA surgery -- resolved  . Hemorrhoids   . History of BPH   . History of melanoma excision    back  . History of nephritis    age 74  . History of squamous cell carcinoma excision    right arm, leg, face  . History of urinary retention    secondary bph with obstruction  . Melanoma ([place])   . Urethral stricture     Past Surgical History:  Procedure Laterality Date  . COLONOSCOPY WITH PROPOFOL N/A 06/13/2017   Procedure: COLONOSCOPY WITH PROPOFOL;  Surgeon: Lollie Sails, MD;  Location: [facility] ENDOSCOPY;  Service: Endoscopy;  Laterality: N/A;  . CYSTOSCOPY WITH RETROGRADE URETHROGRAM N/A 05/08/2014   Procedure: CYSTOSCOPY WITH RETROGRADE URETHROGRAM;  Surgeon: Alexis Frock, MD;  Location: [facility];  Service: Urology;   Laterality: N/A;  . CYSTOSCOPY WITH URETHRAL DILATATION N/A 05/08/2014   Procedure: CYSTOSCOPY WITH URETHRAL DILATATION;  Surgeon: Alexis Frock, MD;  Location: [facility];  Service: Urology;  Laterality: N/A;  . KNEE ARTHROSCOPY W/ MENISCECTOMY Right x2   last one 2005  . LAPAROSCOPIC INGUINAL HERNIA REPAIR Right 2007 (approx)  . ORIF  RIGHT WRIST FX  2008   retained hardware  . ROBOT ASSISTED LAPAROSCOPIC RADICAL PROSTATECTOMY N/A 03/19/2014   Procedure: ROBOTIC ASSISTED LAPAROSCOPIC SIMPLE PROSTATECTOMY;  Surgeon: Alexis Frock, MD;  Location: [facility];  Service: Urology;  Laterality: N/A;  . TONSILLECTOMY  age 78  . UVULOPALATOPHARYNGOPLASTY  x5  last one 1995    Social History Social History   Tobacco Use  . Smoking status: Never Smoker  . Smokeless tobacco: Never Used  Substance Use Topics  . Alcohol use: Yes    Alcohol/week: 0.0 standard drinks    Comment: rare  . Drug use: No    Family History Family History  Problem Relation Age of Onset  . Heart disease Father        CABG x 4  . Arthritis Father   . Glaucoma Father   . Hepatitis C Mother   . Thyroid disease Sister   No family history of bleeding/clotting disorders, porphyria or autoimmune disease   Allergies  Allergen Reactions  . Hydrocodone Other (See Comments)    hyper  . Percocet [Oxycodone-Acetaminophen]  Other (See Comments)    "keeps me wide awake"     REVIEW OF SYSTEMS (Negative unless checked)  Constitutional: [] Weight loss  [] Fever  [] Chills Cardiac: [] Chest pain   [] Chest pressure   [] Palpitations   [] Shortness of breath when laying flat   [] Shortness of breath with exertion. Vascular:  [x] Pain in legs with walking   [x] Pain in legs at rest  [] History of DVT   [] Phlebitis   [] Swelling in legs   [] Varicose veins   [] Non-healing ulcers Pulmonary:   [] Uses home oxygen   [] Productive cough   [] Hemoptysis   [] Wheeze  [] COPD   [] Asthma Neurologic:  [] Dizziness   [] Seizures   [] History  of stroke   [] History of TIA  [] Aphasia   [] Vissual changes   [] Weakness or numbness in arm   [] Weakness or numbness in leg Musculoskeletal:   [] Joint swelling   [x] Joint pain   [x] Low back pain Hematologic:  [] Easy bruising  [] Easy bleeding   [] Hypercoagulable state   [] Anemic Gastrointestinal:  [] Diarrhea   [] Vomiting  [] Gastroesophageal reflux/heartburn   [] Difficulty swallowing. Genitourinary:  [] Chronic kidney disease   [] Difficult urination  [] Frequent urination   [] Blood in urine Skin:  [] Rashes   [] Ulcers  Psychological:  [] History of anxiety   []  History of major depression.  Physical Examination  There were no vitals filed for this visit. There is no height or weight on file to calculate BMI. Gen: WD/WN, NAD Head: [place]/AT, No temporalis wasting.  Ear/Nose/Throat: Hearing grossly intact, nares w/o erythema or drainage, poor dentition Eyes: PER, EOMI, sclera nonicteric.  Neck: Supple, no masses.  No bruit or JVD.  Pulmonary:  Good air movement, clear to auscultation bilaterally, no use of accessory muscles.  Cardiac: RRR, normal S1, S2, no Murmurs. Vascular:  Vessel Right Left  Radial Palpable Palpable  Popliteal Increased Palpable Increased Palpable  PT Palpable Palpable  DP Palpable Palpable  Gastrointestinal: soft, non-distended. No guarding/no peritoneal signs.  Musculoskeletal: M/S 5/5 throughout.  No deformity or atrophy.  Neurologic: CN 2-12 intact. Pain and light touch intact in extremities.  Symmetrical.  Speech is fluent. Motor exam as listed above. Psychiatric: Judgment intact, Mood & affect appropriate for pt's clinical situation. Dermatologic: No rashes or ulcers noted.  No changes consistent with cellulitis. Lymph : No Cervical lymphadenopathy, no lichenification or skin changes of chronic lymphedema.  CBC Lab Results  Component Value Date   WBC 6.3 11/22/2019   HGB 14.0 11/22/2019   HCT 41.5 11/22/2019   MCV 92.8 11/22/2019   PLT 179.0 11/22/2019     BMET    Component Value Date/Time   NA 141 05/25/2020 0800   NA 142 04/09/2012 0000   K 4.0 05/25/2020 0800   CL 105 05/25/2020 0800   CO2 33 (H) 05/25/2020 0800   GLUCOSE 91 05/25/2020 0800   BUN 22 05/25/2020 0800   BUN 22 (A) 04/09/2012 0000   CREATININE 1.06 05/25/2020 0800   CALCIUM 9.6 05/25/2020 0800   GFRNONAA >60 10/21/2017 1754   GFRAA >60 10/21/2017 1754   CrCl cannot be calculated (Patient's most recent lab result is older than the maximum 21 days allowed.).  COAG No results found for: INR, PROTIME  Radiology DG Lumbar Spine 2-3 Views  Result Date: 05/27/2020 CLINICAL DATA:  Low back pain, pain in the buttock and bilateral posterior thighs. EXAM: LUMBAR SPINE - 2-3 VIEW COMPARISON:  CT abdomen and pelvis from 2019 FINDINGS: Spinal degenerative changes greatest at L4-5 and L5-S1. Vertebral body heights are  maintained. 5 lumbar type vertebral bodies are noted with increase in anterolisthesis of L3 on L4 and L4 on L5. 10 mm anterolisthesis of L3 on L4, grade 1/grade 2 with 9 mm of anterolisthesis of L4 on L5. Previously at 4 and 6 respectively. Facet arthropathy. This is greatest at the L3-4, L4-5 and L5-S1 levels. No acute finding. Signs of RIGHT inguinal herniorrhaphy. IMPRESSION: 1. Worsening of spinal degenerative changes and associated anterolisthesis of L3 on L4 and L4 on L5 as described. Electronically Signed   By: Zetta Bills M.D.   On: 05/27/2020 16:11     Assessment/Plan 1. AAA (abdominal aortic aneurysm) without rupture (HCC) No surgery or intervention at this time. The patient has an asymptomatic abdominal aortic aneurysm that is less than 4 cm in maximal diameter.  I have discussed the natural history of abdominal aortic aneurysm and the small risk of rupture for aneurysm less than 5 cm in size.  However, as these small aneurysms tend to enlarge over time, continued surveillance with ultrasound or CT scan is mandatory.  I have also discussed optimizing  medical management with hypertension and lipid control and the importance of abstinence from tobacco.  The patient is also encouraged to exercise a minimum of 30 minutes 4 times a week.  Should the patient develop new onset abdominal or back pain or signs of peripheral embolization they are instructed to seek medical attention immediately and to alert the physician providing care that they have an aneurysm.  The patient voices their understanding. The patient will return in 12 months with an aortic duplex.  - VAS US AORTA/IVC/ILIACS; Future  2. Popliteal aneurysm (HCC) No surgery or intervention at this time.  The patient has an asymptomatic popliteal artery aneurysm that is less than 2.5 cm in maximal diameter.  I have discussed the natural history of popliteal aneurysm and the small risk of thrombosis for aneurysm less than 2.5 cm in size.  However, as these small aneurysms tend to enlarge over time, continued surveillance with ultrasound is mandatory.   I have also discussed optimizing medical management with hypertension and lipid control and the importance of abstinence from tobacco.  The patient is also encouraged to exercise a minimum of 30 minutes 4 times a week.   Should the patient develop new leg pain or signs of peripheral embolization they are instructed to seek medical attention immediately and to alert the physician providing care that they have an aneurysm.  The patient voices their understanding.  - VAS [place] LOWER EXTREMITY ARTERIAL DUPLEX; Future  3. Hypercholesterolemia Continue statin as ordered and reviewed, no changes at this time   4. Primary osteoarthritis involving multiple joints Continue NSAID medications as already ordered, these medications have been reviewed and there are no changes at this time.  Continued activity and therapy was stressed.   5. Chronic asthma without complication, unspecified asthma severity, unspecified whether persistent Continue  pulmonary medications and aerosols as already ordered, these medications have been reviewed and there are no changes at this time.      Hortencia Pilar, MD  06/21/2020 4:43 PM

## 2020-06-22 ENCOUNTER — Encounter (INDEPENDENT_AMBULATORY_CARE_PROVIDER_SITE_OTHER): Payer: Self-pay | Admitting: Vascular Surgery

## 2020-06-22 ENCOUNTER — Other Ambulatory Visit: Payer: Self-pay

## 2020-06-22 ENCOUNTER — Ambulatory Visit (INDEPENDENT_AMBULATORY_CARE_PROVIDER_SITE_OTHER): Payer: PPO

## 2020-06-22 ENCOUNTER — Ambulatory Visit (INDEPENDENT_AMBULATORY_CARE_PROVIDER_SITE_OTHER): Payer: PPO | Admitting: Vascular Surgery

## 2020-06-22 VITALS — BP 155/84 | HR 55 | Resp 16 | Ht 69.0 in | Wt 165.0 lb

## 2020-06-22 DIAGNOSIS — M8949 Other hypertrophic osteoarthropathy, multiple sites: Secondary | ICD-10-CM | POA: Diagnosis not present

## 2020-06-22 DIAGNOSIS — I714 Abdominal aortic aneurysm, without rupture, unspecified: Secondary | ICD-10-CM

## 2020-06-22 DIAGNOSIS — J45909 Unspecified asthma, uncomplicated: Secondary | ICD-10-CM | POA: Diagnosis not present

## 2020-06-22 DIAGNOSIS — M159 Polyosteoarthritis, unspecified: Secondary | ICD-10-CM

## 2020-06-22 DIAGNOSIS — I724 Aneurysm of artery of lower extremity: Secondary | ICD-10-CM | POA: Diagnosis not present

## 2020-06-22 DIAGNOSIS — E7800 Pure hypercholesterolemia, unspecified: Secondary | ICD-10-CM

## 2020-06-22 DIAGNOSIS — M25569 Pain in unspecified knee: Secondary | ICD-10-CM | POA: Insufficient documentation

## 2020-06-22 DIAGNOSIS — Z85828 Personal history of other malignant neoplasm of skin: Secondary | ICD-10-CM | POA: Insufficient documentation

## 2020-06-22 DIAGNOSIS — G4730 Sleep apnea, unspecified: Secondary | ICD-10-CM | POA: Insufficient documentation

## 2020-06-24 DIAGNOSIS — M5416 Radiculopathy, lumbar region: Secondary | ICD-10-CM | POA: Diagnosis not present

## 2020-06-24 DIAGNOSIS — M4316 Spondylolisthesis, lumbar region: Secondary | ICD-10-CM | POA: Diagnosis not present

## 2020-06-26 DIAGNOSIS — M5416 Radiculopathy, lumbar region: Secondary | ICD-10-CM | POA: Diagnosis not present

## 2020-06-26 DIAGNOSIS — M4316 Spondylolisthesis, lumbar region: Secondary | ICD-10-CM | POA: Diagnosis not present

## 2020-06-30 DIAGNOSIS — M4316 Spondylolisthesis, lumbar region: Secondary | ICD-10-CM | POA: Diagnosis not present

## 2020-06-30 DIAGNOSIS — M5416 Radiculopathy, lumbar region: Secondary | ICD-10-CM | POA: Diagnosis not present

## 2020-07-02 DIAGNOSIS — M4316 Spondylolisthesis, lumbar region: Secondary | ICD-10-CM | POA: Diagnosis not present

## 2020-07-02 DIAGNOSIS — M5416 Radiculopathy, lumbar region: Secondary | ICD-10-CM | POA: Diagnosis not present

## 2020-07-07 DIAGNOSIS — M5416 Radiculopathy, lumbar region: Secondary | ICD-10-CM | POA: Diagnosis not present

## 2020-07-07 DIAGNOSIS — M4316 Spondylolisthesis, lumbar region: Secondary | ICD-10-CM | POA: Diagnosis not present

## 2020-07-14 DIAGNOSIS — M4316 Spondylolisthesis, lumbar region: Secondary | ICD-10-CM | POA: Diagnosis not present

## 2020-07-14 DIAGNOSIS — M5416 Radiculopathy, lumbar region: Secondary | ICD-10-CM | POA: Diagnosis not present

## 2020-07-16 DIAGNOSIS — M5416 Radiculopathy, lumbar region: Secondary | ICD-10-CM | POA: Diagnosis not present

## 2020-07-16 DIAGNOSIS — M4316 Spondylolisthesis, lumbar region: Secondary | ICD-10-CM | POA: Diagnosis not present

## 2020-07-22 DIAGNOSIS — M4316 Spondylolisthesis, lumbar region: Secondary | ICD-10-CM | POA: Diagnosis not present

## 2020-07-22 DIAGNOSIS — M5416 Radiculopathy, lumbar region: Secondary | ICD-10-CM | POA: Diagnosis not present

## 2020-07-24 DIAGNOSIS — M5416 Radiculopathy, lumbar region: Secondary | ICD-10-CM | POA: Diagnosis not present

## 2020-07-24 DIAGNOSIS — M4316 Spondylolisthesis, lumbar region: Secondary | ICD-10-CM | POA: Diagnosis not present

## 2020-07-28 ENCOUNTER — Other Ambulatory Visit: Payer: Self-pay | Admitting: Internal Medicine

## 2020-07-30 DIAGNOSIS — J300 Vasomotor rhinitis: Secondary | ICD-10-CM | POA: Diagnosis not present

## 2020-07-30 DIAGNOSIS — M5416 Radiculopathy, lumbar region: Secondary | ICD-10-CM | POA: Diagnosis not present

## 2020-07-30 DIAGNOSIS — M4316 Spondylolisthesis, lumbar region: Secondary | ICD-10-CM | POA: Diagnosis not present

## 2020-08-05 ENCOUNTER — Ambulatory Visit: Payer: PPO | Admitting: Podiatry

## 2020-08-05 ENCOUNTER — Encounter: Payer: Self-pay | Admitting: Podiatry

## 2020-08-05 ENCOUNTER — Other Ambulatory Visit: Payer: Self-pay

## 2020-08-05 DIAGNOSIS — L603 Nail dystrophy: Secondary | ICD-10-CM

## 2020-08-05 NOTE — Progress Notes (Signed)
He presents today for follow-up of his nail fungus.  He is completed his fifth every other day been on the medication on and off for almost a year.  It appears that he is doing well.  Objective: 100% resolution of onychomycosis.  Pulses are palpable no open lesions or wounds are visible.  Assessment: Well-healing onychomycosis.  Plan: Follow-up with me on an as-needed basis discontinue use of Lamisil.

## 2020-08-06 DIAGNOSIS — M5416 Radiculopathy, lumbar region: Secondary | ICD-10-CM | POA: Diagnosis not present

## 2020-08-06 DIAGNOSIS — M4316 Spondylolisthesis, lumbar region: Secondary | ICD-10-CM | POA: Diagnosis not present

## 2020-08-14 ENCOUNTER — Other Ambulatory Visit: Payer: Self-pay

## 2020-08-14 ENCOUNTER — Encounter: Payer: Self-pay | Admitting: Emergency Medicine

## 2020-08-14 ENCOUNTER — Ambulatory Visit
Admission: EM | Admit: 2020-08-14 | Discharge: 2020-08-14 | Disposition: A | Discharge status: Home / Self Care | Payer: PPO | Attending: Emergency Medicine | Admitting: Emergency Medicine

## 2020-08-14 DIAGNOSIS — Z20822 Contact with and (suspected) exposure to covid-19: Secondary | ICD-10-CM | POA: Diagnosis not present

## 2020-08-14 DIAGNOSIS — J302 Other seasonal allergic rhinitis: Secondary | ICD-10-CM | POA: Diagnosis not present

## 2020-08-14 DIAGNOSIS — Z7689 Persons encountering health services in other specified circumstances: Secondary | ICD-10-CM | POA: Diagnosis not present

## 2020-08-14 NOTE — ED Provider Notes (Signed)
[name]    CSN: [id number] Arrival date & time: 08/14/20  1256      History   Chief Complaint Chief Complaint  Patient presents with  . Cough       . Sore Throat       . Nasal Congestion    HPI Kyle Meadows is a 75 y.o. male.   Patient presents with 4-day history of postnasal drip, sore throat, cough.  His wife is COVID positive.  He denies fever, chills, rash, shortness of breath, vomiting, diarrhea, or other symptoms.  Treatment attempted at home with Tylenol cold medication and Robitussin.  His medical history includes asthma, seasonal allergies, abdominal aortic aneurysm, popliteal aneurysm, iliac artery aneurysm, diverticulosis, BPH, cervical radiculopathy, lumbar radiculopathy, degenerative joint disease, osteoarthritis, low back pain.  The history is provided by the patient and medical records.    Past Medical History:  Diagnosis Date  . Arthritis   . Diverticulosis, sigmoid   . Environmental allergies   . Frequency of urination   . H/O sleep apnea    S/P  OSA surgery -- resolved  . Hemorrhoids   . History of BPH   . History of melanoma excision    back  . History of nephritis    age 89  . History of squamous cell carcinoma excision    right arm, leg, face  . History of urinary retention    secondary bph with obstruction  . Melanoma ([place])   . Urethral stricture     Patient Active Problem List   Diagnosis Date Noted  . History of squamous cell carcinoma of skin 06/22/2020  . Sleep apnea 06/22/2020  . Knee pain 06/22/2020  . AAA (abdominal aortic aneurysm) without rupture ([place]) 06/22/2020  . 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 Asthma, chronic 06/22/2020  . Low back pain 05/27/2020  . Light headedness 11/17/2018  . Cervical radiculopathy 10/01/2018  . Patellar tendonitis 10/01/2018  . Osteoarthritis of knee 07/27/2017  . Abdominal pain 12/30/2016  . Iliac artery aneurysm ([place]) 11/22/2016  . Hypercholesterolemia 12/08/2015  .  Health care maintenance 07/13/2014  . DJD (degenerative joint disease) 10/09/2012  . Enlarged prostate 10/09/2012  . Other nonmedicinal substance allergy status 10/09/2012  . Malignant neoplasm of skin 10/09/2012  . Hemorrhoids 10/09/2012  . Benign enlargement of prostate 10/09/2012    Past Surgical History:  Procedure Laterality Date  . COLONOSCOPY WITH PROPOFOL N/A 06/13/2017   Procedure: COLONOSCOPY WITH PROPOFOL;  Surgeon: Lollie Sails, MD;  Location[facility] ENDOSCOPY;  Service: Endoscopy;  Laterality: N/A;  . CYSTOSCOPY WITH RETROGRADE URETHROGRAM N/A 05/08/2014   Procedure: CYSTOSCOPY WITH RETROGRADE URETHROGRAM;  Surgeon: Alexis Frock, MD;  Location: [facility];  Service: Urology;  Laterality: N/A;  . CYSTOSCOPY WITH URETHRAL DILATATION N/A 05/08/2014   Procedure: CYSTOSCOPY WITH URETHRAL DILATATION;  Surgeon: Alexis Frock, MD;  Location: [facility];  Service: Urology;  Laterality: N/A;  . KNEE ARTHROSCOPY W/ MENISCECTOMY Right x2   last one 2005  . LAPAROSCOPIC INGUINAL HERNIA REPAIR Right 2007 (approx)  . ORIF  RIGHT WRIST FX  2008   retained hardware  . ROBOT ASSISTED LAPAROSCOPIC RADICAL PROSTATECTOMY N/A 03/19/2014   Procedure: ROBOTIC ASSISTED LAPAROSCOPIC SIMPLE PROSTATECTOMY;  Surgeon: Alexis Frock, MD;  Location: [facility] ORS;  Service: Urology;  Laterality: N/A;  . TONSILLECTOMY  age 23  . UVULOPALATOPHARYNGOPLASTY  x5  last one 1995       Home Medications    Prior to Admission medications  Medication Sig Start Date End Date Taking? Authorizing Provider  Alpha-Lipoic Acid 300 MG TABS Take by mouth.    [provider]  Boswellia-Glucosamine-Vit D (OSTEO BI-FLEX ONE PER DAY PO) Take by mouth.    [provider]  chlorzoxazone (PARAFON) 500 MG tablet Take 500 mg by mouth every 8 (eight) hours. 06/08/20   [provider]  Coenzyme Q10 (COQ10 PO) Take by mouth.    [provider]  diclofenac  (VOLTAREN) 75 MG EC tablet SMARTSIG:1 Tablet(s) By Mouth Every 12 Hours 06/08/20   [provider]  DOCUSATE SODIUM PO Take by mouth.    [provider]  fexofenadine-pseudoephedrine (ALLEGRA-D 24) 180-240 MG 24 hr tablet Take 1 tablet by mouth daily.    [provider]  finasteride (PROSCAR) 5 MG tablet Take 5 mg by mouth daily.    [provider]  fluticasone Asencion Islam) 50 MCG/ACT nasal spray SMARTSIG:1-2 Spray(s) Both Nares Daily 06/17/20   [provider]  ipratropium (ATROVENT) 0.06 % nasal spray SMARTSIG:1-2 Spray(s) Both Nares 3 Times Daily 10/28/19   [provider]  levocetirizine (XYZAL) 5 MG tablet Take 5 mg by mouth at bedtime. 03/07/19   [provider]  meloxicam (MOBIC) 7.5 MG tablet  11/05/18   [provider]  methylPREDNISolone (MEDROL DOSEPAK) 4 MG TBPK tablet Medrol dose pack - 6 day taper 05/27/20   Einar Pheasant, MD  montelukast (SINGULAIR) 10 MG tablet Take 10 mg by mouth at bedtime. 03/30/19   [provider]  Multiple Vitamins-Minerals (CENTRUM SILVER ADULT 50+ PO) Take 1 tablet by mouth daily.    [provider]  Omega-3 Fatty Acids (FISH OIL PO) Take by mouth.    [provider]  oxybutynin (DITROPAN) 5 MG tablet TK 1 T PO Q 8 H PRF URINARY FREQUENCY OR URGENCY 03/31/15   [provider]  rosuvastatin (CRESTOR) 10 MG tablet TAKE 2 TABLETS ON MONDAY, WEDNESDAY AND FRIDAY AND ONE TABLET ON ALL OTHER DAYS OF THE WEEK 07/28/20   Einar Pheasant, MD  terbinafine (LAMISIL) 250 MG tablet Take 1 tablet (250 mg total) by mouth daily. 05/06/20   Hyatt, Max T, DPM  triamcinolone cream (KENALOG) 0.1 % Apply topically 2 (two) times daily. 12/23/19   [provider]    Family History Family History  Problem Relation Age of Onset  . Heart disease Father        CABG x 4  . Arthritis Father   . Glaucoma Father   . Hepatitis C Mother   . Thyroid disease Sister     Social  History Social History   Tobacco Use  . Smoking status: Never Smoker  . Smokeless tobacco: Never Used  Substance Use Topics  . Alcohol use: Yes    Alcohol/week: 0.0 standard drinks    Comment: rare  . Drug use: No     Allergies   Hydrocodone and Percocet [oxycodone-acetaminophen]   Review of Systems Review of Systems  Constitutional: Negative for chills and fever.  HENT: Positive for postnasal drip and sore throat. Negative for ear pain.   Eyes: Negative for pain and visual disturbance.  Respiratory: Positive for cough. Negative for shortness of breath.   Cardiovascular: Negative for chest pain and palpitations.  Gastrointestinal: Negative for abdominal pain, diarrhea and vomiting.  Genitourinary: Negative for dysuria and hematuria.  Musculoskeletal: Negative for arthralgias and back pain.  Skin: Negative for color change and rash.  Neurological: Negative for seizures and syncope.  All other systems  reviewed and are negative.    Physical Exam Triage Vital Signs ED Triage Vitals  Enc Vitals Group     BP      Pulse      Resp      Temp      Temp src      SpO2      Weight      Height      Head Circumference      Peak Flow      Pain Score      Pain Loc      Pain Edu?      Excl. in [place]?    No data found.  Updated Vital Signs BP 134/78 (BP Location: Right Arm)   Pulse 67   Temp 97.9 F (36.6 C) (Temporal)   Resp 16   Ht 5\' 9"  (1.753 m)   Wt 163 lb (73.9 kg)   SpO2 98%   BMI 24.07 kg/m   Visual Acuity Right Eye Distance:   Left Eye Distance:   Bilateral Distance:    Right Eye Near:   Left Eye Near:    Bilateral Near:     Physical Exam Vitals and nursing note reviewed.  Constitutional:      General: He is not in acute distress.    Appearance: He is well-developed. He is not ill-appearing.  HENT:     Head: Normocephalic and atraumatic.     Right Ear: Tympanic membrane normal.     Left Ear: Tympanic membrane normal.     Nose: Nose normal.      Mouth/Throat:     Mouth: Mucous membranes are moist.     Pharynx: Oropharynx is clear.     Comments: Clear postnasal drip Eyes:     Conjunctiva/sclera: Conjunctivae normal.  Cardiovascular:     Rate and Rhythm: Normal rate and regular rhythm.     Heart sounds: Normal heart sounds.  Pulmonary:     Effort: Pulmonary effort is normal. No respiratory distress.     Breath sounds: Normal breath sounds.  Abdominal:     Palpations: Abdomen is soft.     Tenderness: There is no abdominal tenderness.  Musculoskeletal:     Cervical back: Neck supple.  Skin:    General: Skin is warm and dry.  Neurological:     General: No focal deficit present.     Mental Status: He is alert and oriented to person, place, and time.     Gait: Gait normal.  Psychiatric:        Mood and Affect: Mood normal.        Behavior: Behavior normal.      UC Treatments / Results  Labs (all labs ordered are listed, but only abnormal results are displayed) Labs Reviewed  NOVEL CORONAVIRUS, NAA    EKG   Radiology No results found.  Procedures Procedures (including critical care time)  Medications Ordered in UC Medications - No data to display  Initial Impression / Assessment and Plan / UC Course  I have reviewed the triage vital signs and the nursing notes.  Pertinent labs & imaging results that were available during my care of the patient were reviewed by me and considered in my medical decision making (see chart for details).   Seasonal allergies, exposure to COVID.  Discussed continued use of allergy medications.  PCR COVID pending.  Instructed patient to self quarantine until the test result is back.  Discussed other symptomatic treatment including Tylenol as needed for  discomfort.  Instructed him to follow-up with his PCP if his symptoms are not improving.  He agrees to plan of care.   Final Clinical Impressions(s) / UC Diagnoses   Final diagnoses:  Seasonal allergies  Exposure to COVID-19 virus      Discharge Instructions     Continue to take your allergy medications as usual.    Your COVID test is pending.  You should self quarantine until the test result is back.    Take Tylenol as needed for fever or discomfort.  Rest and keep yourself hydrated.    Follow-up with your primary care provider if your symptoms are not improving.        ED Prescriptions    None     PDMP not reviewed this encounter.   Sharion Balloon, NP 08/14/20 1332

## 2020-08-14 NOTE — ED Triage Notes (Signed)
Patient in office today  [place] c/o cough,sorethroat, post nasal drip x 4d Wife is covid positive  OTC: Tylenol severe cold, Robitussin  Denies: Fever, N/v, Headache

## 2020-08-14 NOTE — Discharge Instructions (Signed)
Continue to take your allergy medications as usual.    Your COVID test is pending.  You should self quarantine until the test result is back.    Take Tylenol as needed for fever or discomfort.  Rest and keep yourself hydrated.    Follow-up with your primary care provider if your symptoms are not improving.

## 2020-08-16 LAB — SARS-COV-2, NAA 2 DAY TAT

## 2020-08-16 LAB — NOVEL CORONAVIRUS, NAA: SARS-CoV-2, NAA: DETECTED — AB

## 2020-08-17 ENCOUNTER — Telehealth (HOSPITAL_COMMUNITY): Payer: Self-pay

## 2020-08-17 NOTE — Telephone Encounter (Signed)
Called to discuss with patient about COVID-19 symptoms and the use of one of the available treatments for those with mild to moderate Covid symptoms and at a high risk of hospitalization.  Pt appears to qualify for outpatient treatment due to co-morbid conditions and/or a member of an at-risk group in accordance with the FDA Emergency Use Authorization.    Symptom onset: Tuesday 08/11/20, c/o sore throat, drainage, "allegry like symptoms" Vaccinated: Yes Booster: Yes Immunocompromised: No Qualifiers: Age, HLD NIH Criteria: Age  Patient is currently out of town, unable to make it back for treatment.    Symptoms tier reviewed as well as criteria for ending isolation. Preventative practices reviewed. Patient verbalized understanding. Patient advised to go to Urgent care or ED with severe symptoms.    Janine Ores, RN

## 2020-09-04 DIAGNOSIS — M5416 Radiculopathy, lumbar region: Secondary | ICD-10-CM | POA: Diagnosis not present

## 2020-09-07 ENCOUNTER — Ambulatory Visit
Admission: EM | Admit: 2020-09-07 | Discharge: 2020-09-07 | Disposition: A | Discharge status: Home / Self Care | Payer: PPO | Attending: Emergency Medicine | Admitting: Emergency Medicine

## 2020-09-07 ENCOUNTER — Other Ambulatory Visit: Payer: Self-pay

## 2020-09-07 DIAGNOSIS — K529 Noninfective gastroenteritis and colitis, unspecified: Secondary | ICD-10-CM | POA: Diagnosis not present

## 2020-09-07 MED ORDER — ONDANSETRON 4 MG PO TBDP: 4.0000 mg | ORAL_TABLET | Freq: Three times a day (TID) | ORAL | 0 refills | Status: DC | PRN

## 2020-09-07 NOTE — Discharge Instructions (Addendum)
Take the antinausea medication as directed.    Keep yourself hydrated with clear liquids, such as water, Gatorade, Pedialyte, Sprite, or ginger ale.    Go to the emergency department if you have acute worsening symptoms.    Follow up with your primary care provider if your symptoms are not improving.      

## 2020-09-07 NOTE — ED Triage Notes (Signed)
Patient states that he has been having diarrhea that has been off and on since Thursday. Patient states that he is also having nausea, decreased appetite and fevers. States that he no energy. States that if he eats something he becomes nausea. Reports that he vomited on Thursday and Friday but none since.

## 2020-09-07 NOTE — ED Provider Notes (Signed)
[name]    CSN: [id number] Arrival date & time: 09/07/20  1222      History   Chief Complaint Chief Complaint  Patient presents with  . Diarrhea    HPI SUHEYB RAUCCI is a 75 y.o. male.   Patient presents with fever, nausea, vomiting, diarrhea intermittently for 5 days.  Last vomiting or diarrhea occurred on 09/04/2020.  T-max 101 last Friday.  He reports ongoing nausea which is worse with food intake.  He denies rash, sore throat, cough, shortness of breath, or other symptoms.  No treatment at home.  Patient was positive for COVID on 08/14/2020.  His medical history includes asthma, seasonal allergies, abdominal aortic aneurysm, popliteal aneurysm, iliac artery aneurysm, diverticulosis, BPH, cervical radiculopathy, lumbar radiculopathy, degenerative joint disease, osteoarthritis, low back pain.    The history is provided by the patient and medical records.    Past Medical History:  Diagnosis Date  . Arthritis   . Diverticulosis, sigmoid   . Environmental allergies   . Frequency of urination   . H/O sleep apnea    S/P  OSA surgery -- resolved  . Hemorrhoids   . History of BPH   . History of melanoma excision    back  . History of nephritis    age 28  . History of squamous cell carcinoma excision    right arm, leg, face  . History of urinary retention    secondary bph with obstruction  . Melanoma ([place])   . Urethral stricture     Patient Active Problem List   Diagnosis Date Noted  . History of squamous cell carcinoma of skin 06/22/2020  . Sleep apnea 06/22/2020  . Knee pain 06/22/2020  . AAA (abdominal aortic aneurysm) without rupture ([place]) 06/22/2020  . 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 Asthma, chronic 06/22/2020  . Low back pain 05/27/2020  . Light headedness 11/17/2018  . Cervical radiculopathy 10/01/2018  . Patellar tendonitis 10/01/2018  . Osteoarthritis of knee 07/27/2017  . Abdominal pain 12/30/2016  . Iliac artery aneurysm ([place])  11/22/2016  . Hypercholesterolemia 12/08/2015  . Health care maintenance 07/13/2014  . DJD (degenerative joint disease) 10/09/2012  . Enlarged prostate 10/09/2012  . Other nonmedicinal substance allergy status 10/09/2012  . Malignant neoplasm of skin 10/09/2012  . Hemorrhoids 10/09/2012  . Benign enlargement of prostate 10/09/2012    Past Surgical History:  Procedure Laterality Date  . COLONOSCOPY WITH PROPOFOL N/A 06/13/2017   Procedure: COLONOSCOPY WITH PROPOFOL;  Surgeon: Lollie Sails, MD;  Location: [facility] ENDOSCOPY;  Service: Endoscopy;  Laterality: N/A;  . CYSTOSCOPY WITH RETROGRADE URETHROGRAM N/A 05/08/2014   Procedure: CYSTOSCOPY WITH RETROGRADE URETHROGRAM;  Surgeon: Alexis Frock, MD;  Location: [facility];  Service: Urology;  Laterality: N/A;  . CYSTOSCOPY WITH URETHRAL DILATATION N/A 05/08/2014   Procedure: CYSTOSCOPY WITH URETHRAL DILATATION;  Surgeon: Alexis Frock, MD;  Location: [facility];  Service: Urology;  Laterality: N/A;  . KNEE ARTHROSCOPY W/ MENISCECTOMY Right x2   last one 2005  . LAPAROSCOPIC INGUINAL HERNIA REPAIR Right 2007 (approx)  . ORIF  RIGHT WRIST FX  2008   retained hardware  . ROBOT ASSISTED LAPAROSCOPIC RADICAL PROSTATECTOMY N/A 03/19/2014   Procedure: ROBOTIC ASSISTED LAPAROSCOPIC SIMPLE PROSTATECTOMY;  Surgeon: Alexis Frock, MD;  Location: [facility];  Service: Urology;  Laterality: N/A;  . TONSILLECTOMY  age 83  . UVULOPALATOPHARYNGOPLASTY  x5  last one 1995       Home Medications    Prior  to Admission medications   Medication Sig Start Date End Date Taking? Authorizing Provider  Alpha-Lipoic Acid 300 MG TABS Take by mouth.   Yes [provider]  Boswellia-Glucosamine-Vit D (OSTEO BI-FLEX ONE PER DAY PO) Take by mouth.   Yes [provider]  chlorzoxazone (PARAFON) 500 MG tablet Take 500 mg by mouth every 8 (eight) hours. 06/08/20  Yes [provider]  Coenzyme Q10 (COQ10 PO) Take  by mouth.   Yes [provider]  diclofenac (VOLTAREN) 75 MG EC tablet SMARTSIG:1 Tablet(s) By Mouth Every 12 Hours 06/08/20  Yes [provider]  DOCUSATE SODIUM PO Take by mouth.   Yes [provider]  fexofenadine-pseudoephedrine (ALLEGRA-D 24) 180-240 MG 24 hr tablet Take 1 tablet by mouth daily.   Yes [provider]  finasteride (PROSCAR) 5 MG tablet Take 5 mg by mouth daily.   Yes [provider]  fluticasone (FLONASE) 50 MCG/ACT nasal spray SMARTSIG:1-2 Spray(s) Both Nares Daily 06/17/20  Yes [provider]  ipratropium (ATROVENT) 0.06 % nasal spray SMARTSIG:1-2 Spray(s) Both Nares 3 Times Daily 10/28/19  Yes [provider]  levocetirizine (XYZAL) 5 MG tablet Take 5 mg by mouth at bedtime. 03/07/19  Yes [provider]  meloxicam (MOBIC) 7.5 MG tablet  11/05/18  Yes [provider]  methylPREDNISolone (MEDROL DOSEPAK) 4 MG TBPK tablet Medrol dose pack - 6 day taper 05/27/20  Yes Einar Pheasant, MD  montelukast (SINGULAIR) 10 MG tablet Take 10 mg by mouth at bedtime. 03/30/19  Yes [provider]  Multiple Vitamins-Minerals (CENTRUM SILVER ADULT 50+ PO) Take 1 tablet by mouth daily.   Yes [provider]  Omega-3 Fatty Acids (FISH OIL PO) Take by mouth.   Yes [provider]  ondansetron (ZOFRAN ODT) 4 MG disintegrating tablet Take 1 tablet (4 mg total) by mouth every 8 (eight) hours as needed for nausea or vomiting. 09/07/20  Yes Sharion Balloon, NP  oxybutynin (DITROPAN) 5 MG tablet TK 1 T PO Q 8 H PRF URINARY FREQUENCY OR URGENCY 03/31/15  Yes [provider]  rosuvastatin (CRESTOR) 10 MG tablet TAKE 2 TABLETS ON MONDAY, WEDNESDAY AND FRIDAY AND ONE TABLET ON ALL OTHER DAYS OF THE WEEK 07/28/20  Yes Einar Pheasant, MD  terbinafine (LAMISIL) 250 MG tablet Take 1 tablet (250 mg total) by mouth daily. 05/06/20  Yes Hyatt, Max T, DPM  triamcinolone cream (KENALOG) 0.1 % Apply topically 2  (two) times daily. 12/23/19  Yes [provider]    Family History Family History  Problem Relation Age of Onset  . Heart disease Father        CABG x 4  . Arthritis Father   . Glaucoma Father   . Hepatitis C Mother   . Thyroid disease Sister     Social History Social History   Tobacco Use  . Smoking status: Never Smoker  . Smokeless tobacco: Never Used  Vaping Use  . Vaping Use: Never used  Substance Use Topics  . Alcohol use: Yes    Alcohol/week: 0.0 standard drinks    Comment: rare  . Drug use: No     Allergies   Hydrocodone and Percocet [oxycodone-acetaminophen]   Review of Systems Review of Systems  Constitutional: Positive for fever. Negative for chills.  HENT: Negative for ear pain and sore throat.   Respiratory: Negative for cough and shortness of breath.   Cardiovascular: Negative for chest pain and palpitations.  Gastrointestinal: Positive for diarrhea, nausea and vomiting.  Negative for abdominal pain.  Genitourinary: Negative for dysuria and hematuria.  Skin: Negative for color change and rash.  All other systems reviewed and are negative.    Physical Exam Triage Vital Signs ED Triage Vitals  Enc Vitals Group     BP      Pulse      Resp      Temp      Temp src      SpO2      Weight      Height      Head Circumference      Peak Flow      Pain Score      Pain Loc      Pain Edu?      Excl. in [place]?    No data found.  Updated Vital Signs BP 133/81 (BP Location: Left Arm)   Pulse 81   Temp 99.2 F (37.3 C) (Oral)   Resp 17   Ht 5\' 9"  (1.753 m)   Wt 166 lb (75.3 kg)   SpO2 95%   BMI 24.51 kg/m   Visual Acuity Right Eye Distance:   Left Eye Distance:   Bilateral Distance:    Right Eye Near:   Left Eye Near:    Bilateral Near:     Physical Exam Vitals and nursing note reviewed.  Constitutional:      General: He is not in acute distress.    Appearance: He is well-developed. He is not ill-appearing.  HENT:     Head:  Normocephalic and atraumatic.     Right Ear: Tympanic membrane normal.     Left Ear: Tympanic membrane normal.     Nose: Nose normal.     Mouth/Throat:     Mouth: Mucous membranes are moist.     Pharynx: Oropharynx is clear.  Eyes:     Conjunctiva/sclera: Conjunctivae normal.  Cardiovascular:     Rate and Rhythm: Normal rate and regular rhythm.     Heart sounds: Normal heart sounds.  Pulmonary:     Effort: Pulmonary effort is normal. No respiratory distress.     Breath sounds: Normal breath sounds.  Abdominal:     General: Bowel sounds are normal. There is no distension.     Palpations: Abdomen is soft.     Tenderness: There is no abdominal tenderness. There is no right CVA tenderness, left CVA tenderness, guarding or rebound.  Musculoskeletal:     Cervical back: Neck supple.  Skin:    General: Skin is warm and dry.  Neurological:     General: No focal deficit present.     Mental Status: He is alert and oriented to person, place, and time.     Gait: Gait normal.  Psychiatric:        Mood and Affect: Mood normal.        Behavior: Behavior normal.      UC Treatments / Results  Labs (all labs ordered are listed, but only abnormal results are displayed) Labs Reviewed - No data to display  EKG   Radiology No results found.  Procedures Procedures (including critical care time)  Medications Ordered in UC Medications - No data to display  Initial Impression / Assessment and Plan / UC Course  I have reviewed the triage vital signs and the nursing notes.  Pertinent labs & imaging results that were available during my care of the patient were reviewed by me and considered in my medical decision making (see chart for details).  Gastroenteritis.  Patient is well-appearing, vital signs stable, exam reassuring.  Treating nausea/vomiting with Zofran.  Instructed patient to keep himself hydrated with clear liquids.  Discussed that he should advance his diet as tolerated to  the [employer] for diarrhea.  ED precautions discussed.  Instructed patient to follow-up with his PCP as needed.  He agrees to plan of care.     Final Clinical Impressions(s) / UC Diagnoses   Final diagnoses:  Gastroenteritis     Discharge Instructions     Take the antinausea medication as directed.    Keep yourself hydrated with clear liquids, such as water, Gatorade, Pedialyte, Sprite, or ginger ale.    Go to the emergency department if you have acute worsening symptoms.    Follow up with your primary care provider if your symptoms are not improving.         ED Prescriptions    Medication Sig Dispense Auth. Provider   ondansetron (ZOFRAN ODT) 4 MG disintegrating tablet Take 1 tablet (4 mg total) by mouth every 8 (eight) hours as needed for nausea or vomiting. 20 tablet Sharion Balloon, NP     PDMP not reviewed this encounter.   Sharion Balloon, NP 09/07/20 1314

## 2020-09-15 DIAGNOSIS — D2271 Melanocytic nevi of right lower limb, including hip: Secondary | ICD-10-CM | POA: Diagnosis not present

## 2020-09-15 DIAGNOSIS — X32XXXA Exposure to sunlight, initial encounter: Secondary | ICD-10-CM | POA: Diagnosis not present

## 2020-09-15 DIAGNOSIS — Z85828 Personal history of other malignant neoplasm of skin: Secondary | ICD-10-CM | POA: Diagnosis not present

## 2020-09-15 DIAGNOSIS — D225 Melanocytic nevi of trunk: Secondary | ICD-10-CM | POA: Diagnosis not present

## 2020-09-15 DIAGNOSIS — L570 Actinic keratosis: Secondary | ICD-10-CM | POA: Diagnosis not present

## 2020-09-15 DIAGNOSIS — L820 Inflamed seborrheic keratosis: Secondary | ICD-10-CM | POA: Diagnosis not present

## 2020-09-15 DIAGNOSIS — L538 Other specified erythematous conditions: Secondary | ICD-10-CM | POA: Diagnosis not present

## 2020-09-15 DIAGNOSIS — Z85820 Personal history of malignant melanoma of skin: Secondary | ICD-10-CM | POA: Diagnosis not present

## 2020-09-16 DIAGNOSIS — M5416 Radiculopathy, lumbar region: Secondary | ICD-10-CM | POA: Diagnosis not present

## 2020-09-18 DIAGNOSIS — M5416 Radiculopathy, lumbar region: Secondary | ICD-10-CM | POA: Diagnosis not present

## 2020-09-22 ENCOUNTER — Other Ambulatory Visit (INDEPENDENT_AMBULATORY_CARE_PROVIDER_SITE_OTHER): Payer: PPO

## 2020-09-22 ENCOUNTER — Other Ambulatory Visit: Payer: Self-pay

## 2020-09-22 DIAGNOSIS — E7800 Pure hypercholesterolemia, unspecified: Secondary | ICD-10-CM

## 2020-09-22 LAB — BASIC METABOLIC PANEL
BUN: 22 mg/dL (ref 6–23)
CO2: 29 mEq/L (ref 19–32)
Calcium: 9.4 mg/dL (ref 8.4–10.5)
Chloride: 105 mEq/L (ref 96–112)
Creatinine, Ser: 0.94 mg/dL (ref 0.40–1.50)
GFR: 79.71 mL/min (ref >60.00)
Glucose, Bld: 94 mg/dL (ref 70–99)
Potassium: 4.1 mEq/L (ref 3.5–5.1)
Sodium: 141 mEq/L (ref 135–145)

## 2020-09-22 LAB — LIPID PANEL
Cholesterol: 142 mg/dL (ref 0–200)
HDL: 38 mg/dL — ABNORMAL LOW (ref >39.00)
LDL Cholesterol: 89 mg/dL (ref 0–99)
NonHDL: 104.19
Total CHOL/HDL Ratio: 4
Triglycerides: 77 mg/dL (ref 0.0–149.0)
VLDL: 15.4 mg/dL (ref 0.0–40.0)

## 2020-09-22 LAB — HEPATIC FUNCTION PANEL
ALT: 20 U/L (ref 0–53)
AST: 17 U/L (ref 0–37)
Albumin: 4.3 g/dL (ref 3.5–5.2)
Alkaline Phosphatase: 47 U/L (ref 39–117)
Bilirubin, Direct: 0.1 mg/dL (ref 0.0–0.3)
Total Bilirubin: 0.7 mg/dL (ref 0.2–1.2)
Total Protein: 6.2 g/dL (ref 6.0–8.3)

## 2020-09-24 ENCOUNTER — Other Ambulatory Visit: Payer: Self-pay

## 2020-09-24 ENCOUNTER — Ambulatory Visit (INDEPENDENT_AMBULATORY_CARE_PROVIDER_SITE_OTHER): Payer: PPO | Admitting: Internal Medicine

## 2020-09-24 DIAGNOSIS — I723 Aneurysm of iliac artery: Secondary | ICD-10-CM | POA: Diagnosis not present

## 2020-09-24 DIAGNOSIS — I714 Abdominal aortic aneurysm, without rupture, unspecified: Secondary | ICD-10-CM

## 2020-09-24 DIAGNOSIS — E7800 Pure hypercholesterolemia, unspecified: Secondary | ICD-10-CM

## 2020-09-24 DIAGNOSIS — M5441 Lumbago with sciatica, right side: Secondary | ICD-10-CM | POA: Diagnosis not present

## 2020-09-24 DIAGNOSIS — M5442 Lumbago with sciatica, left side: Secondary | ICD-10-CM | POA: Diagnosis not present

## 2020-09-24 NOTE — Progress Notes (Signed)
Patient ID: ESPN ZEMAN, male   DOB: [birth year], 75 y.o.   MRN: [id number]   Subjective:    Patient ID: JUNIE ENGRAM, male    DOB: 1945-11-29, 75 y.o.   MRN: [id number]  HPI This visit occurred during the SARS-CoV-2 public health emergency.  Safety protocols were in place, including screening questions prior to the visit, additional usage of staff PPE, and extensive cleaning of exam room while observing appropriate contact time as indicated for disinfecting solutions.  Patient here for a scheduled follow up. Diagnosed with covid 08/14/20.  Evaluated 09/07/20 - ER - gastroenteritis.  Given zofran and diet adjusted.  Denies any chest pain or sob.  No residual problems from covid.  No cough or congestion reported.  No abdominal pain.  Bowels moving.  Back issues - right leg numbness and tingling. Planning injection next week.  Rode bike yesterday.  No nausea or vomiting.    Past Medical History:  Diagnosis Date   Arthritis    Diverticulosis, sigmoid    Environmental allergies    Frequency of urination    H/O sleep apnea    S/P  OSA surgery -- resolved   Hemorrhoids    History of BPH    History of melanoma excision    back   History of nephritis    age 64   History of squamous cell carcinoma excision    right arm, leg, face   History of urinary retention    secondary bph with obstruction   Melanoma ([place])    Urethral stricture    Past Surgical History:  Procedure Laterality Date   COLONOSCOPY WITH PROPOFOL N/A 06/13/2017   Procedure: COLONOSCOPY WITH PROPOFOL;  Surgeon: Lollie Sails, MD;  Location: [facility];  Service: Endoscopy;  Laterality: N/A;   CYSTOSCOPY WITH RETROGRADE URETHROGRAM N/A 05/08/2014   Procedure: CYSTOSCOPY WITH RETROGRADE URETHROGRAM;  Surgeon: Alexis Frock, MD;  Location: [facility];  Service: Urology;  Laterality: N/A;   CYSTOSCOPY WITH URETHRAL DILATATION N/A 05/08/2014   Procedure: CYSTOSCOPY WITH URETHRAL DILATATION;  Surgeon:  Alexis Frock, MD;  Location: [facility];  Service: Urology;  Laterality: N/A;   KNEE ARTHROSCOPY W/ MENISCECTOMY Right x2   last one 2005   LAPAROSCOPIC INGUINAL HERNIA REPAIR Right 2007 (approx)   ORIF  RIGHT WRIST FX  2008   retained hardware   ROBOT ASSISTED LAPAROSCOPIC RADICAL PROSTATECTOMY N/A 03/19/2014   Procedure: ROBOTIC ASSISTED LAPAROSCOPIC SIMPLE PROSTATECTOMY;  Surgeon: Alexis Frock, MD;  Location: [facility];  Service: Urology;  Laterality: N/A;   TONSILLECTOMY  age 33   UVULOPALATOPHARYNGOPLASTY  x5  last one 63   Family History  Problem Relation Age of Onset   Heart disease Father        CABG x 4   Arthritis Father    Glaucoma Father    Hepatitis C Mother    Thyroid disease Sister    Social History   Socioeconomic History   Marital status: Married    Spouse name: Not on file   Number of children: Not on file   Years of education: Not on file   Highest education level: Not on file  Occupational History   Not on file  Tobacco Use   Smoking status: Never   Smokeless tobacco: Never  Vaping Use   Vaping Use: Never used  Substance and Sexual Activity   Alcohol use: Yes    Alcohol/week: 0.0 standard drinks    Comment: rare   Drug  use: No   Sexual activity: Not on file  Other Topics Concern   Not on file  Social History Narrative   Not on file   Social Determinants of Health   Financial Resource Strain: Low Risk    Difficulty of Paying Living Expenses: Not hard at all  Food Insecurity: No Food Insecurity   Worried About Running Out of Food in the Last Year: Never true   [place] in the Last Year: Never true  Transportation Needs: No Transportation Needs   Lack of Transportation (Medical): No   Lack of Transportation (Non-Medical): No  Physical Activity: Sufficiently Active   Days of Exercise per Week: 3 days   Minutes of Exercise per Session: 60 min  Stress: No Stress Concern Present   Feeling of Stress : Not at all  Social  Connections: Unknown   Frequency of Communication with Friends and Family: Not on file   Frequency of Social Gatherings with Friends and Family: More than three times a week   Attends Religious Services: Not on file   Active Member of Clubs or Organizations: Not on file   Attends Archivist Meetings: Not on file   Marital Status: Married    Outpatient Encounter Medications as of 09/24/2020  Medication Sig   Acetylcarnitine HCl (ACETYL L-CARNITINE PO) Take 300 mg by mouth.   Alpha-Lipoic Acid 300 MG TABS Take by mouth.   Boswellia-Glucosamine-Vit D (OSTEO BI-FLEX ONE PER DAY PO) Take by mouth.   Coenzyme Q10 (COQ10 PO) Take by mouth.   diclofenac (VOLTAREN) 75 MG EC tablet SMARTSIG:1 Tablet(s) By Mouth Every 12 Hours   DOCUSATE SODIUM PO Take by mouth.   fexofenadine-pseudoephedrine (ALLEGRA-D 24) 180-240 MG 24 hr tablet Take 1 tablet by mouth daily.   fluticasone (FLONASE) 50 MCG/ACT nasal spray SMARTSIG:1-2 Spray(s) Both Nares Daily   ipratropium (ATROVENT) 0.06 % nasal spray SMARTSIG:1-2 Spray(s) Both Nares 3 Times Daily   levocetirizine (XYZAL) 5 MG tablet Take 5 mg by mouth at bedtime.   montelukast (SINGULAIR) 10 MG tablet Take 10 mg by mouth at bedtime.   Multiple Vitamins-Minerals (CENTRUM SILVER ADULT 50+ PO) Take 1 tablet by mouth daily.   Omega-3 Fatty Acids (FISH OIL PO) Take by mouth.   oxybutynin (DITROPAN) 5 MG tablet TK 1 T PO Q 8 H PRF URINARY FREQUENCY OR URGENCY   rosuvastatin (CRESTOR) 10 MG tablet TAKE 2 TABLETS ON MONDAY, WEDNESDAY AND FRIDAY AND ONE TABLET ON ALL OTHER DAYS OF THE WEEK   triamcinolone cream (KENALOG) 0.1 % Apply topically 2 (two) times daily.   [DISCONTINUED] chlorzoxazone (PARAFON) 500 MG tablet Take 500 mg by mouth every 8 (eight) hours. (Patient not taking: Reported on 09/24/2020)   [DISCONTINUED] finasteride (PROSCAR) 5 MG tablet Take 5 mg by mouth daily. (Patient not taking: Reported on 09/24/2020)   [DISCONTINUED] meloxicam (MOBIC) 7.5 MG  tablet  (Patient not taking: Reported on 09/24/2020)   [DISCONTINUED] methylPREDNISolone (MEDROL DOSEPAK) 4 MG TBPK tablet Medrol dose pack - 6 day taper (Patient not taking: Reported on 09/24/2020)   [DISCONTINUED] ondansetron (ZOFRAN ODT) 4 MG disintegrating tablet Take 1 tablet (4 mg total) by mouth every 8 (eight) hours as needed for nausea or vomiting. (Patient not taking: Reported on 09/24/2020)   [DISCONTINUED] terbinafine (LAMISIL) 250 MG tablet Take 1 tablet (250 mg total) by mouth daily. (Patient not taking: Reported on 09/24/2020)   No facility-administered encounter medications on file as of 09/24/2020.    Review of Systems  Constitutional:  Negative for appetite change and unexpected weight change.  HENT:  Negative for congestion and sinus pressure.   Respiratory:  Negative for cough, chest tightness and shortness of breath.   Cardiovascular:  Negative for chest pain and palpitations.  Gastrointestinal:  Negative for abdominal pain, diarrhea, nausea and vomiting.  Genitourinary:  Negative for difficulty urinating and dysuria.  Musculoskeletal:  Negative for joint swelling and myalgias.       Back/right leg issues as outlined.    Skin:  Negative for color change and rash.  Neurological:  Negative for dizziness, light-headedness and headaches.  Psychiatric/Behavioral:  Negative for agitation and dysphoric mood.       Objective:    Physical Exam Vitals reviewed.  Constitutional:      General: He is not in acute distress.    Appearance: Normal appearance. He is well-developed.  HENT:     Head: Normocephalic and atraumatic.     Right Ear: External ear normal.     Left Ear: External ear normal.  Eyes:     General: No scleral icterus.       Right eye: No discharge.        Left eye: No discharge.     Conjunctiva/sclera: Conjunctivae normal.  Cardiovascular:     Rate and Rhythm: Normal rate and regular rhythm.  Pulmonary:     Effort: Pulmonary effort is normal. No respiratory  distress.     Breath sounds: Normal breath sounds.  Abdominal:     General: Bowel sounds are normal.     Palpations: Abdomen is soft.     Tenderness: There is no abdominal tenderness.  Musculoskeletal:        General: No swelling or tenderness.     Cervical back: Neck supple. No tenderness.  Lymphadenopathy:     Cervical: No cervical adenopathy.  Skin:    Findings: No erythema or rash.  Neurological:     Mental Status: He is alert.  Psychiatric:        Mood and Affect: Mood normal.        Behavior: Behavior normal.    BP 118/70   Pulse 74   Temp (!) 97.4 F (36.3 C)   Resp 16   Ht 5\' 9"  (1.753 m)   Wt 166 lb (75.3 kg)   SpO2 98%   BMI 24.51 kg/m  Wt Readings from Last 3 Encounters:  09/24/20 166 lb (75.3 kg)  09/07/20 166 lb (75.3 kg)  08/14/20 163 lb (73.9 kg)     Lab Results  Component Value Date   WBC 6.3 11/22/2019   HGB 14.0 11/22/2019   HCT 41.5 11/22/2019   PLT 179.0 11/22/2019   GLUCOSE 94 09/22/2020   CHOL 142 09/22/2020   TRIG 77.0 09/22/2020   HDL 38.00 (L) 09/22/2020   LDLCALC 89 09/22/2020   ALT 20 09/22/2020   AST 17 09/22/2020   NA 141 09/22/2020   K 4.1 09/22/2020   CL 105 09/22/2020   CREATININE 0.94 09/22/2020   BUN 22 09/22/2020   CO2 29 09/22/2020   TSH 1.56 11/22/2019   PSA 1.29 05/25/2020       Assessment & Plan:   Problem List Items Addressed This Visit     AAA (abdominal aortic aneurysm) without rupture ([place])    Evaluated by [facility] 05/2020 - stable.  Recommended f/u in 12 months.         Hypercholesterolemia    On crestor.  Low cholesterol diet and exercise.  Follow  lipid panel and liver function tests.         Iliac artery aneurysm [facility])    History of bilateral iliac artery aneurysm - evaluated 05/2020.  Stable.  Recommended f/u in 12 months.        Low back pain    Planning steroid injection.  Symptoms as outlined.  Follow.          Einar Pheasant, MD

## 2020-09-30 DIAGNOSIS — M5416 Radiculopathy, lumbar region: Secondary | ICD-10-CM | POA: Diagnosis not present

## 2020-10-02 ENCOUNTER — Encounter: Payer: Self-pay | Admitting: Internal Medicine

## 2020-10-02 NOTE — Assessment & Plan Note (Signed)
On crestor.  Low cholesterol diet and exercise.  Follow lipid panel and liver function tests.   

## 2020-10-02 NOTE — Assessment & Plan Note (Signed)
Evaluated by [facility] 05/2020 - stable.  Recommended f/u in 12 months.

## 2020-10-02 NOTE — Assessment & Plan Note (Signed)
History of bilateral iliac artery aneurysm - evaluated 05/2020.  Stable.  Recommended f/u in 12 months.

## 2020-10-02 NOTE — Assessment & Plan Note (Signed)
Planning steroid injection.  Symptoms as outlined.  Follow.

## 2020-10-23 DIAGNOSIS — M5416 Radiculopathy, lumbar region: Secondary | ICD-10-CM | POA: Diagnosis not present

## 2020-10-28 ENCOUNTER — Other Ambulatory Visit: Payer: Self-pay | Admitting: Internal Medicine

## 2021-01-11 ENCOUNTER — Ambulatory Visit (INDEPENDENT_AMBULATORY_CARE_PROVIDER_SITE_OTHER): Payer: PPO

## 2021-01-11 VITALS — Ht 69.0 in | Wt 166.0 lb

## 2021-01-11 DIAGNOSIS — Z0000 Encounter for general adult medical examination without abnormal findings: Secondary | ICD-10-CM

## 2021-01-11 NOTE — Patient Instructions (Addendum)
Kyle Meadows , Thank you for taking time to come for your Medicare Wellness Visit. I appreciate your ongoing commitment to your health goals. Please review the following plan we discussed and let me know if I can assist you in the future.   These are the goals we discussed:  Goals       Patient Stated     Weight (lb) < 165 lb (74.8 kg) (pt-stated)      Weight goal 160-165lb Stay active/fit        This is a list of the screening recommended for you and due dates:  Health Maintenance  Topic Date Due   Zoster (Shingles) Vaccine (1 of 2) 01/23/2021*   Flu Shot  07/23/2021*   COVID-19 Vaccine (5 - Booster for Pfizer series) 04/23/2021   Colon Cancer Screening  06/14/2027   Tetanus Vaccine  01/23/2030   Hepatitis C Screening: USPSTF Recommendation to screen - Ages 18-79 yo.  Completed   HPV Vaccine  Aged Out  *Topic was postponed. The date shown is not the original due date.   Advanced directives: on file  Conditions/risks identified: none new  Follow up in one year for your annual wellness visit.   Preventive Care 75 Years and Older, Male Preventive care refers to lifestyle choices and visits with your health care provider that can promote health and wellness. What does preventive care include? A yearly physical exam. This is also called an annual well check. Dental exams once or twice a year. Routine eye exams. Ask your health care provider how often you should have your eyes checked. Personal lifestyle choices, including: Daily care of your teeth and gums. Regular physical activity. Eating a healthy diet. Avoiding tobacco and drug use. Limiting alcohol use. Practicing safe sex. Taking low doses of aspirin every day. Taking vitamin and mineral supplements as recommended by your health care provider. What happens during an annual well check? The services and screenings done by your health care provider during your annual well check will depend on your age, overall health,  lifestyle risk factors, and family history of disease. Counseling  Your health care provider may ask you questions about your: Alcohol use. Tobacco use. Drug use. Emotional well-being. Home and relationship well-being. Sexual activity. Eating habits. History of falls. Memory and ability to understand (cognition). Work and work Statistician. Screening  You may have the following tests or measurements: Height, weight, and BMI. Blood pressure. Lipid and cholesterol levels. These may be checked every 5 years, or more frequently if you are over 78 years old. Skin check. Lung cancer screening. You may have this screening every year starting at age 2 if you have a 30-pack-year history of smoking and currently smoke or have quit within the past 15 years. Fecal occult blood test (FOBT) of the stool. You may have this test every year starting at age 40. Flexible sigmoidoscopy or colonoscopy. You may have a sigmoidoscopy every 5 years or a colonoscopy every 10 years starting at age 58. Prostate cancer screening. Recommendations will vary depending on your family history and other risks. Hepatitis C blood test. Hepatitis B blood test. Sexually transmitted disease (STD) testing. Diabetes screening. This is done by checking your blood sugar (glucose) after you have not eaten for a while (fasting). You may have this done every 1-3 years. Abdominal aortic aneurysm (AAA) screening. You may need this if you are a current or former smoker. Osteoporosis. You may be screened starting at age 21 if you are at high  risk. Talk with your health care provider about your test results, treatment options, and if necessary, the need for more tests. Vaccines  Your health care provider may recommend certain vaccines, such as: Influenza vaccine. This is recommended every year. Tetanus, diphtheria, and acellular pertussis (Tdap, Td) vaccine. You may need a Td booster every 10 years. Zoster vaccine. You may need this  after age 57. Pneumococcal 13-valent conjugate (PCV13) vaccine. One dose is recommended after age 43. Pneumococcal polysaccharide (PPSV23) vaccine. One dose is recommended after age 57. Talk to your health care provider about which screenings and vaccines you need and how often you need them. This information is not intended to replace advice given to you by your health care provider. Make sure you discuss any questions you have with your health care provider. Document Released: 05/08/2015 Document Revised: 12/30/2015 Document Reviewed: 02/10/2015 Elsevier Interactive Patient Education  2017 [place] Prevention in the Home Falls can cause injuries. They can happen to people of all ages. There are many things you can do to make your home safe and to help prevent falls. What can I do on the outside of my home? Regularly fix the edges of walkways and driveways and fix any cracks. Remove anything that might make you trip as you walk through a door, such as a raised step or threshold. Trim any bushes or trees on the path to your home. Use bright outdoor lighting. Clear any walking paths of anything that might make someone trip, such as rocks or tools. Regularly check to see if handrails are loose or broken. Make sure that both sides of any steps have handrails. Any raised decks and porches should have guardrails on the edges. Have any leaves, snow, or ice cleared regularly. Use sand or salt on walking paths during winter. Clean up any spills in your garage right away. This includes oil or grease spills. What can I do in the bathroom? Use night lights. Install grab bars by the toilet and in the tub and shower. Do not use towel bars as grab bars. Use non-skid mats or decals in the tub or shower. If you need to sit down in the shower, use a plastic, non-slip stool. Keep the floor dry. Clean up any water that spills on the floor as soon as it happens. Remove soap buildup in the tub or  shower regularly. Attach bath mats securely with double-sided non-slip rug tape. Do not have throw rugs and other things on the floor that can make you trip. What can I do in the bedroom? Use night lights. Make sure that you have a light by your bed that is easy to reach. Do not use any sheets or blankets that are too big for your bed. They should not hang down onto the floor. Have a firm chair that has side arms. You can use this for support while you get dressed. Do not have throw rugs and other things on the floor that can make you trip. What can I do in the kitchen? Clean up any spills right away. Avoid walking on wet floors. Keep items that you use a lot in easy-to-reach places. If you need to reach something above you, use a strong step stool that has a grab bar. Keep electrical cords out of the way. Do not use floor polish or wax that makes floors slippery. If you must use wax, use non-skid floor wax. Do not have throw rugs and other things on the floor that can  make you trip. What can I do with my stairs? Do not leave any items on the stairs. Make sure that there are handrails on both sides of the stairs and use them. Fix handrails that are broken or loose. Make sure that handrails are as long as the stairways. Check any carpeting to make sure that it is firmly attached to the stairs. Fix any carpet that is loose or worn. Avoid having throw rugs at the top or bottom of the stairs. If you do have throw rugs, attach them to the floor with carpet tape. Make sure that you have a light switch at the top of the stairs and the bottom of the stairs. If you do not have them, ask someone to add them for you. What else can I do to help prevent falls? Wear shoes that: Do not have high heels. Have rubber bottoms. Are comfortable and fit you well. Are closed at the toe. Do not wear sandals. If you use a stepladder: Make sure that it is fully opened. Do not climb a closed stepladder. Make  sure that both sides of the stepladder are locked into place. Ask someone to hold it for you, if possible. Clearly mark and make sure that you can see: Any grab bars or handrails. First and last steps. Where the edge of each step is. Use tools that help you move around (mobility aids) if they are needed. These include: Canes. Walkers. Scooters. Crutches. Turn on the lights when you go into a dark area. Replace any light bulbs as soon as they burn out. Set up your furniture so you have a clear path. Avoid moving your furniture around. If any of your floors are uneven, fix them. If there are any pets around you, be aware of where they are. Review your medicines with your doctor. Some medicines can make you feel dizzy. This can increase your chance of falling. Ask your doctor what other things that you can do to help prevent falls. This information is not intended to replace advice given to you by your health care provider. Make sure you discuss any questions you have with your health care provider. Document Released: 02/05/2009 Document Revised: 09/17/2015 Document Reviewed: 05/16/2014 Elsevier Interactive Patient Education  2017 [employer].

## 2021-01-11 NOTE — Progress Notes (Signed)
Subjective:   Kyle Meadows is a 75 y.o. male who presents for Medicare Annual/Subsequent preventive examination.  Review of Systems    No ROS.  Medicare Wellness Virtual Visit.  Visual/audio telehealth visit, UTA vital signs.   See social history for additional risk factors.         Objective:    Today's Vitals   01/11/21 0825  Weight: 166 lb (75.3 kg)  Height: '5\' 9"'$  (1.753 m)   Body mass index is 24.51 kg/m.  Advanced Directives 01/11/2021 09/07/2020 01/08/2019 01/01/2018 07/03/2017 06/13/2017 12/27/2016  Does Patient Have a Medical Advance Directive? Yes No Yes Yes Yes Yes Yes  Type of Paramedic of [place];Living will - [place];Living will [place];Living will Living will Living will Living will;Healthcare Power of Attorney  Does patient want to make changes to medical advance directive? No - Patient declined - No - Patient declined No - Patient declined - - No - Patient declined  Copy of [place] in Chart? Yes - validated most recent copy scanned in chart (See row information) - Yes - validated most recent copy scanned in chart (See row information) Yes - - Yes    Current Medications (verified) Outpatient Encounter Medications as of 01/11/2021  Medication Sig   Acetylcarnitine HCl (ACETYL L-CARNITINE PO) Take 300 mg by mouth.   Alpha-Lipoic Acid 300 MG TABS Take by mouth.   Boswellia-Glucosamine-Vit D (OSTEO BI-FLEX ONE PER DAY PO) Take by mouth.   Coenzyme Q10 (COQ10 PO) Take by mouth.   DOCUSATE SODIUM PO Take by mouth.   fexofenadine-pseudoephedrine (ALLEGRA-D 24) 180-240 MG 24 hr tablet Take 1 tablet by mouth daily.   fluticasone (FLONASE) 50 MCG/ACT nasal spray SMARTSIG:1-2 Spray(s) Both Nares Daily   ipratropium (ATROVENT) 0.06 % nasal spray SMARTSIG:1-2 Spray(s) Both Nares 3 Times Daily   levocetirizine (XYZAL) 5 MG tablet Take 5 mg by mouth at bedtime.   meloxicam (MOBIC) 7.5 MG  tablet Take 7.5 mg by mouth daily.   montelukast (SINGULAIR) 10 MG tablet Take 10 mg by mouth at bedtime.   Multiple Vitamins-Minerals (CENTRUM SILVER ADULT 50+ PO) Take 1 tablet by mouth daily.   Omega-3 Fatty Acids (FISH OIL PO) Take by mouth.   oxybutynin (DITROPAN) 5 MG tablet TK 1 T PO Q 8 H PRF URINARY FREQUENCY OR URGENCY   rosuvastatin (CRESTOR) 10 MG tablet TAKE 2 TABLETS ON MONDAY, WEDNESDAY AND FRIDAY AND ONE TABLET ON ALL OTHER DAYS OF THE WEEK   triamcinolone cream (KENALOG) 0.1 % Apply topically 2 (two) times daily.   finasteride (PROSCAR) 5 MG tablet Take 5 mg by mouth daily.   [DISCONTINUED] diclofenac (VOLTAREN) 75 MG EC tablet SMARTSIG:1 Tablet(s) By Mouth Every 12 Hours   No facility-administered encounter medications on file as of 01/11/2021.    Allergies (verified) Hydrocodone and Percocet [oxycodone-acetaminophen]   History: Past Medical History:  Diagnosis Date   Arthritis    Diverticulosis, sigmoid    Environmental allergies    Frequency of urination    H/O sleep apnea    S/P  OSA surgery -- resolved   Hemorrhoids    History of BPH    History of melanoma excision    back   History of nephritis    age 16   History of squamous cell carcinoma excision    right arm, leg, face   History of urinary retention    secondary bph with obstruction   Melanoma ([place])  Urethral stricture    Past Surgical History:  Procedure Laterality Date   COLONOSCOPY WITH PROPOFOL N/A 06/13/2017   Procedure: COLONOSCOPY WITH PROPOFOL;  Surgeon: Lollie Sails, MD;  Location: [facility] ENDOSCOPY;  Service: Endoscopy;  Laterality: N/A;   CYSTOSCOPY WITH RETROGRADE URETHROGRAM N/A 05/08/2014   Procedure: CYSTOSCOPY WITH RETROGRADE URETHROGRAM;  Surgeon: Alexis Frock, MD;  Location[facility];  Service: Urology;  Laterality: N/A;   CYSTOSCOPY WITH URETHRAL DILATATION N/A 05/08/2014   Procedure: CYSTOSCOPY WITH URETHRAL DILATATION;  Surgeon: Alexis Frock, MD;   Location: [facility];  Service: Urology;  Laterality: N/A;   KNEE ARTHROSCOPY W/ MENISCECTOMY Right x2   last one 2005   LAPAROSCOPIC INGUINAL HERNIA REPAIR Right 2007 (approx)   ORIF  RIGHT WRIST FX  2008   retained hardware   ROBOT ASSISTED LAPAROSCOPIC RADICAL PROSTATECTOMY N/A 03/19/2014   Procedure: ROBOTIC ASSISTED LAPAROSCOPIC SIMPLE PROSTATECTOMY;  Surgeon: Alexis Frock, MD;  Location: [facility];  Service: Urology;  Laterality: N/A;   TONSILLECTOMY  age 65   UVULOPALATOPHARYNGOPLASTY  x5  last one 84   Family History  Problem Relation Age of Onset   Heart disease Father        CABG x 4   Arthritis Father    Glaucoma Father    Hepatitis C Mother    Thyroid disease Sister    Social History   Socioeconomic History   Marital status: Married    Spouse name: Not on file   Number of children: Not on file   Years of education: Not on file   Highest education level: Not on file  Occupational History   Not on file  Tobacco Use   Smoking status: Never   Smokeless tobacco: Never  Vaping Use   Vaping Use: Never used  Substance and Sexual Activity   Alcohol use: Yes    Alcohol/week: 0.0 standard drinks    Comment: rare   Drug use: No   Sexual activity: Not on file  Other Topics Concern   Not on file  Social History Narrative   Not on file   Social Determinants of Health   Financial Resource Strain: Low Risk    Difficulty of Paying Living Expenses: Not hard at all  Food Insecurity: No Food Insecurity   Worried About Charity fundraiser in the Last Year: Never true   [place] in the Last Year: Never true  Transportation Needs: No Transportation Needs   Lack of Transportation (Medical): No   Lack of Transportation (Non-Medical): No  Physical Activity: Sufficiently Active   Days of Exercise per Week: 3 days   Minutes of Exercise per Session: 60 min  Stress: No Stress Concern Present   Feeling of Stress : Not at all  Social Connections: Unknown    Frequency of Communication with Friends and Family: Not on file   Frequency of Social Gatherings with Friends and Family: More than three times a week   Attends Religious Services: Not on Electrical engineer or Organizations: Not on file   Attends Archivist Meetings: Not on file   Marital Status: Married    Tobacco Counseling Counseling given: Not Answered   Clinical Intake:  Pre-visit preparation completed: Yes        Diabetes: No  How often do you need to have someone help you when you read instructions, pamphlets, or other written materials from your doctor or pharmacy?: 1 - Never  Interpreter Needed?: No      Activities of Daily Living In your present state of health, do you have any difficulty performing the following activities: 01/11/2021  Hearing? N  Vision? N  Difficulty concentrating or making decisions? N  Walking or climbing stairs? N  Dressing or bathing? N  Doing errands, shopping? N  Preparing Food and eating ? N  Using the Toilet? N  In the past six months, have you accidently leaked urine? N  Do you have problems with loss of bowel control? N  Managing your Medications? N  Managing your Finances? N  Housekeeping or managing your Housekeeping? N  Some recent data might be hidden    Patient Care Team: Einar Pheasant, MD as PCP - General (Internal Medicine)  Indicate any recent Medical Services you may have received from other than [facility] providers in the past year (date may be approximate).     Assessment:   This is a routine wellness examination for Trell.  I connected with Gal[place] today by telephone and verified that I am speaking with the correct person using two identifiers. Location patient: home Location provider: work Persons participating in the virtual visit: patient, Marine scientist.    I discussed the limitations, risks, security and privacy concerns of performing an evaluation and management service by telephone and the  availability of in person appointments. The patient expressed understanding and verbally consented to this telephonic visit.    Interactive audio and video telecommunications were attempted between this provider and patient, however failed, due to patient having technical difficulties OR patient did not have access to video capability.  We continued and completed visit with audio only.  Some vital signs may be absent or patient reported.   Hearing/Vision screen Hearing Screening - Comments:: Hearing aids Vision Screening - Comments:: Followed by [facility] Wears corrective lenses They have seen their ophthalmologist in the last 12 months.    Dietary issues and exercise activities discussed: Current Exercise Habits: Home exercise routine, Type of exercise: calisthenics, Time (Minutes): 60, Frequency (Times/Week): 3, Weekly Exercise (Minutes/Week): 180, Intensity: Mild Healthy diet Good water intake   Goals Addressed               This Visit's Progress     Patient Stated     Weight (lb) < 165 lb (74.8 kg) (pt-stated)   166 lb (75.3 kg)     Weight goal 160-165lb Stay active/fit       Depression Screen PHQ 2/9 Scores 01/11/2021 01/09/2020 05/20/2019 01/08/2019 11/13/2018 01/01/2018 12/27/2016  PHQ - 2 Score 0 0 0 0 0 0 0  PHQ- 9 Score - - - 0 0 - -    Fall Risk Fall Risk  01/11/2021 01/09/2020 05/20/2019 01/08/2019 11/13/2018  Falls in the past year? 0 0 0 0 0  Number falls in past yr: 0 0 - - -  Injury with Fall? 0 - - - -  Follow up Falls evaluation completed Falls evaluation completed - - -    FALL RISK PREVENTION PERTAINING TO THE HOME: Adequate lighting in your home to reduce risk of falls? Yes   ASSISTIVE DEVICES UTILIZED TO PREVENT FALLS: Use of a cane, walker or w/c? No   TIMED UP AND GO: Was the test performed? No .    Cognitive Function: Patient is alert and oriented x3.  Enjoys reading for brain stimulation.  Manages his own finances.   MMSE - Mini Mental  State Exam 01/01/2018 12/27/2016  Orientation to time  5 5  Orientation to Place 5 5  Registration 3 3  Attention/ Calculation 5 5  Recall 3 3  Language- name 2 objects 2 2  Language- repeat 1 1  Language- follow 3 step command 3 3  Language- read & follow direction 1 1  Write a sentence 1 1  Copy design 1 1  Total score 30 30     6CIT Screen 01/08/2019  What Year? 0 points  What month? 0 points  What time? 0 points  Count back from 20 0 points  Months in reverse 0 points  Repeat phrase 0 points  Total Score 0   Immunizations Immunization History  Administered Date(s) Administered   Fluad Quad(high Dose 65+) 01/25/2019   Influenza Whole 01/28/2009, 02/09/2010, 01/24/2016   Influenza, High Dose Seasonal PF 12/27/2016, 01/01/2018, 01/20/2020   Influenza,inj,Quad PF,6+ Mos 01/28/2014   PFIZER(Purple Top)SARS-COV-2 Vaccination 05/25/2019, 06/15/2019, 01/31/2020, 12/22/2020   Pneumococcal Conjugate-13 06/09/2016   Pneumococcal Polysaccharide-23 04/16/2012   Tdap 01/01/2004, 02/02/2016, 01/24/2020   Shingrix vaccine-Due, Education has been provided regarding the importance of this vaccine. Advised may receive this vaccine at local pharmacy or Health Dept. Aware to provide a copy of the vaccination record if obtained from local pharmacy or Health Dept. Verbalized acceptance and understanding. Deferred.   Influenza vaccine- plans to receive later in the season.  Health Maintenance Health Maintenance  Topic Date Due   Zoster Vaccines- Shingrix (1 of 2) 01/23/2021 (Originally 12/11/1964)   INFLUENZA VACCINE  07/23/2021 (Originally 11/23/2020)   COVID-19 Vaccine (5 - Booster for [place] series) 04/23/2021   COLONOSCOPY (Pts 45-40yr Insurance coverage will need to be confirmed)  06/14/2027   TETANUS/TDAP  01/23/2030   Hepatitis C Screening  Completed   HPV VACCINES  Aged Out   Lung Cancer Screening: (Low Dose CT Chest recommended if Age 75-80years, 30 pack-year currently smoking OR  have quit w/in 15years.) does not qualify.   Vision Screening: Recommended annual ophthalmology exams for early detection of glaucoma and other disorders of the eye.  Dental Screening: Recommended annual dental exams for proper oral hygiene  Community Resource Referral / Chronic Care Management: CRR required this visit?  No   CCM required this visit?  No      Plan:   Keep all routine maintenance appointments.   I have personally reviewed and noted the following in the patient's chart:   Medical and social history Use of alcohol, tobacco or illicit drugs  Current medications and supplements including opioid prescriptions. Patient is not currently taking opioid prescriptions. Functional ability and status Nutritional status Physical activity Advanced directives List of other physicians Hospitalizations, surgeries, and ER visits in previous 12 months Vitals Screenings to include cognitive, depression, and falls Referrals and appointments  In addition, I have reviewed and discussed with patient certain preventive protocols, quality metrics, and best practice recommendations. A written personalized care plan for preventive services as well as general preventive health recommendations were provided to patient via mychart.     OVarney Biles LPN   [phone]

## 2021-01-26 ENCOUNTER — Other Ambulatory Visit: Payer: Self-pay

## 2021-01-26 ENCOUNTER — Telehealth: Payer: Self-pay | Admitting: Internal Medicine

## 2021-01-26 ENCOUNTER — Ambulatory Visit (INDEPENDENT_AMBULATORY_CARE_PROVIDER_SITE_OTHER): Payer: PPO | Admitting: Internal Medicine

## 2021-01-26 DIAGNOSIS — N400 Enlarged prostate without lower urinary tract symptoms: Secondary | ICD-10-CM | POA: Diagnosis not present

## 2021-01-26 DIAGNOSIS — E7800 Pure hypercholesterolemia, unspecified: Secondary | ICD-10-CM

## 2021-01-26 DIAGNOSIS — I723 Aneurysm of iliac artery: Secondary | ICD-10-CM

## 2021-01-26 DIAGNOSIS — I714 Abdominal aortic aneurysm, without rupture, unspecified: Secondary | ICD-10-CM | POA: Diagnosis not present

## 2021-01-26 NOTE — Telephone Encounter (Signed)
Labs ordered.

## 2021-01-26 NOTE — Addendum Note (Signed)
Addended by: Lars Masson on: 01/26/2021 01:55 PM   Modules accepted: Orders

## 2021-01-26 NOTE — Telephone Encounter (Signed)
Patient scheduled for labs 02/02/21 as requested by check out note.   Needing orders placed.

## 2021-01-26 NOTE — Progress Notes (Signed)
[name], male   DOB: Sep 07, 1945, 75 y.o.   MRN: [id number]   Subjective:    [name], male    DOB: [birth year], 75 y.o.   MRN: [id number]  This visit occurred during the SARS-CoV-2 public health emergency.  Safety protocols were in place, including screening questions prior to the visit, additional usage of staff PPE, and extensive cleaning of exam room while observing appropriate contact time as indicated for disinfecting solutions.   Patient here for a scheduled follow up.   Chief Complaint  Patient presents with   Hyperlipidemia   .   HPI He reports he is doing relatively well.  Stays active.  Planning to travel back to [place] to visit his son.  No chest pain or sob reported.  No abdominal pain or bowel change reported.     Past Medical History:  Diagnosis Date   Arthritis    Diverticulosis, sigmoid    Environmental allergies    Frequency of urination    H/O sleep apnea    S/P  OSA surgery -- resolved   Hemorrhoids    History of BPH    History of melanoma excision    back   History of nephritis    age 85   History of squamous cell carcinoma excision    right arm, leg, face   History of urinary retention    secondary bph with obstruction   Melanoma ([place])    Urethral stricture    Past Surgical History:  Procedure Laterality Date   COLONOSCOPY WITH PROPOFOL N/A 06/13/2017   Procedure: COLONOSCOPY WITH PROPOFOL;  Surgeon: Lollie Sails, MD;  Location: [facility] ENDOSCOPY;  Service: Endoscopy;  Laterality: N/A;   CYSTOSCOPY WITH RETROGRADE URETHROGRAM N/A 05/08/2014   Procedure: CYSTOSCOPY WITH RETROGRADE URETHROGRAM;  Surgeon: Alexis Frock, MD;  Location: [facility];  Service: Urology;  Laterality: N/A;   CYSTOSCOPY WITH URETHRAL DILATATION N/A 05/08/2014   Procedure: CYSTOSCOPY WITH URETHRAL DILATATION;  Surgeon: Alexis Frock, MD;  Location[facility];  Service: Urology;  Laterality: N/A;   KNEE  ARTHROSCOPY W/ MENISCECTOMY Right x2   last one 2005   LAPAROSCOPIC INGUINAL HERNIA REPAIR Right 2007 (approx)   ORIF  RIGHT WRIST FX  2008   retained hardware   ROBOT ASSISTED LAPAROSCOPIC RADICAL PROSTATECTOMY N/A 03/19/2014   Procedure: ROBOTIC ASSISTED LAPAROSCOPIC SIMPLE PROSTATECTOMY;  Surgeon: Alexis Frock, MD;  Location: [facility];  Service: Urology;  Laterality: N/A;   TONSILLECTOMY  age 4   UVULOPALATOPHARYNGOPLASTY  x5  last one 52   Family History  Problem Relation Age of Onset   Heart disease Father        CABG x 4   Arthritis Father    Glaucoma Father    Hepatitis C Mother    Thyroid disease Sister    Social History   Socioeconomic History   Marital status: Married    Spouse name: Not on file   Number of children: Not on file   Years of education: Not on file   Highest education level: Not on file  Occupational History   Not on file  Tobacco Use   Smoking status: Never   Smokeless tobacco: Never  Vaping Use   Vaping Use: Never used  Substance and Sexual Activity   Alcohol use: Yes    Alcohol/week: 0.0 standard drinks    Comment: rare   Drug use: No   Sexual activity: Not on file  Other  Topics Concern   Not on file  Social History Narrative   Not on file   Social Determinants of Health   Financial Resource Strain: Low Risk    Difficulty of Paying Living Expenses: Not hard at all  Food Insecurity: No Food Insecurity   Worried About Charity fundraiser in the Last Year: Never true   [place] in the Last Year: Never true  Transportation Needs: No Transportation Needs   Lack of Transportation (Medical): No   Lack of Transportation (Non-Medical): No  Physical Activity: Sufficiently Active   Days of Exercise per Week: 3 days   Minutes of Exercise per Session: 60 min  Stress: No Stress Concern Present   Feeling of Stress : Not at all  Social Connections: Unknown   Frequency of Communication with Friends and Family: Not on file   Frequency of  Social Gatherings with Friends and Family: More than three times a week   Attends Religious Services: Not on Electrical engineer or Organizations: Not on file   Attends Archivist Meetings: Not on file   Marital Status: Married     Review of Systems  Constitutional:  Negative for appetite change and unexpected weight change.  HENT:  Negative for congestion and sinus pressure.   Respiratory:  Negative for cough, chest tightness and shortness of breath.   Cardiovascular:  Negative for chest pain and palpitations.  Gastrointestinal:  Negative for abdominal pain, diarrhea, nausea and vomiting.  Genitourinary:  Negative for difficulty urinating and dysuria.  Musculoskeletal:  Negative for joint swelling and myalgias.  Skin:  Negative for color change and rash.  Neurological:  Negative for dizziness, light-headedness and headaches.  Psychiatric/Behavioral:  Negative for agitation and dysphoric mood.       Objective:     BP 118/68   Pulse 64   Temp 97.8 F (36.6 C)   Resp 16   Ht 5\' 9"  (1.753 m)   Wt 170 lb (77.1 kg)   SpO2 98%   BMI 25.10 kg/m  Wt Readings from Last 3 Encounters:  01/26/21 170 lb (77.1 kg)  01/11/21 166 lb (75.3 kg)  09/24/20 166 lb (75.3 kg)    Physical Exam Vitals reviewed.  Constitutional:      General: He is not in acute distress.    Appearance: Normal appearance. He is well-developed.  HENT:     Head: Normocephalic and atraumatic.     Right Ear: External ear normal.     Left Ear: External ear normal.  Eyes:     General: No scleral icterus.       Right eye: No discharge.        Left eye: No discharge.     Conjunctiva/sclera: Conjunctivae normal.  Cardiovascular:     Rate and Rhythm: Normal rate and regular rhythm.  Pulmonary:     Effort: Pulmonary effort is normal. No respiratory distress.     Breath sounds: Normal breath sounds.  Abdominal:     General: Bowel sounds are normal.     Palpations: Abdomen is soft.      Tenderness: There is no abdominal tenderness.  Musculoskeletal:        General: No swelling or tenderness.     Cervical back: Neck supple. No tenderness.  Lymphadenopathy:     Cervical: No cervical adenopathy.  Skin:    Findings: No erythema or rash.  Neurological:     Mental Status: He is alert.  Psychiatric:  Mood and Affect: Mood normal.        Behavior: Behavior normal.     Outpatient Encounter Medications as of 01/26/2021  Medication Sig   Acetylcarnitine HCl (ACETYL L-CARNITINE PO) Take 300 mg by mouth.   Alpha-Lipoic Acid 300 MG TABS Take by mouth.   Boswellia-Glucosamine-Vit D (OSTEO BI-FLEX ONE PER DAY PO) Take by mouth.   Coenzyme Q10 (COQ10 PO) Take by mouth.   DOCUSATE SODIUM PO Take by mouth.   fexofenadine-pseudoephedrine (ALLEGRA-D 24) 180-240 MG 24 hr tablet Take 1 tablet by mouth daily.   finasteride (PROSCAR) 5 MG tablet Take 5 mg by mouth daily.   fluticasone (FLONASE) 50 MCG/ACT nasal spray SMARTSIG:1-2 Spray(s) Both Nares Daily   ipratropium (ATROVENT) 0.06 % nasal spray SMARTSIG:1-2 Spray(s) Both Nares 3 Times Daily   levocetirizine (XYZAL) 5 MG tablet Take 5 mg by mouth at bedtime.   meloxicam (MOBIC) 7.5 MG tablet Take 7.5 mg by mouth daily.   montelukast (SINGULAIR) 10 MG tablet Take 10 mg by mouth at bedtime.   Multiple Vitamins-Minerals (CENTRUM SILVER ADULT 50+ PO) Take 1 tablet by mouth daily.   Omega-3 Fatty Acids (FISH OIL PO) Take by mouth.   oxybutynin (DITROPAN) 5 MG tablet TK 1 T PO Q 8 H PRF URINARY FREQUENCY OR URGENCY   rosuvastatin (CRESTOR) 10 MG tablet TAKE 2 TABLETS ON MONDAY, WEDNESDAY AND FRIDAY AND ONE TABLET ON ALL OTHER DAYS OF THE WEEK   triamcinolone cream (KENALOG) 0.1 % Apply topically 2 (two) times daily.   No facility-administered encounter medications on file as of 01/26/2021.     Lab Results  Component Value Date   WBC 6.3 11/22/2019   HGB 14.0 11/22/2019   HCT 41.5 11/22/2019   PLT 179.0 11/22/2019   GLUCOSE 94  09/22/2020   CHOL 142 09/22/2020   TRIG 77.0 09/22/2020   HDL 38.00 (L) 09/22/2020   LDLCALC 89 09/22/2020   ALT 20 09/22/2020   AST 17 09/22/2020   NA 141 09/22/2020   K 4.1 09/22/2020   CL 105 09/22/2020   CREATININE 0.94 09/22/2020   BUN 22 09/22/2020   CO2 29 09/22/2020   TSH 1.56 11/22/2019   PSA 1.29 05/25/2020       Assessment & Plan:   Problem List Items Addressed This Visit     AAA (abdominal aortic aneurysm) without rupture    Evaluated by [facility] 05/2020 - stable.  Recommended f/u in 12 months.        Benign enlargement of prostate    S/p simple prostatectomy.  Followed by urology.       Hypercholesterolemia    On crestor.  Low cholesterol diet and exercise.  Follow lipid panel and liver function tests.        Iliac artery aneurysm [facility])    History of bilateral iliac artery aneurysm - evaluated 05/2020.  Stable.  Recommended f/u in 12 months.         Einar Pheasant, MD

## 2021-01-31 ENCOUNTER — Encounter: Payer: Self-pay | Admitting: Internal Medicine

## 2021-01-31 NOTE — Assessment & Plan Note (Signed)
On crestor.  Low cholesterol diet and exercise.  Follow lipid panel and liver function tests.   

## 2021-01-31 NOTE — Assessment & Plan Note (Signed)
Evaluated by [facility] 05/2020 - stable.  Recommended f/u in 12 months.

## 2021-01-31 NOTE — Assessment & Plan Note (Signed)
History of bilateral iliac artery aneurysm - evaluated 05/2020.  Stable.  Recommended f/u in 12 months.

## 2021-01-31 NOTE — Assessment & Plan Note (Signed)
S/p simple prostatectomy.  Followed by urology.

## 2021-02-02 ENCOUNTER — Other Ambulatory Visit (INDEPENDENT_AMBULATORY_CARE_PROVIDER_SITE_OTHER): Payer: PPO

## 2021-02-02 ENCOUNTER — Other Ambulatory Visit: Payer: Self-pay

## 2021-02-02 DIAGNOSIS — E7800 Pure hypercholesterolemia, unspecified: Secondary | ICD-10-CM

## 2021-02-02 LAB — CBC WITH DIFFERENTIAL/PLATELET
Basophils Absolute: 0.1 10*3/uL (ref 0.0–0.1)
Basophils Relative: 1 % (ref 0.0–3.0)
Eosinophils Absolute: 0.2 10*3/uL (ref 0.0–0.7)
Eosinophils Relative: 3.3 % (ref 0.0–5.0)
HCT: 40.7 % (ref 39.0–52.0)
Hemoglobin: 13.8 g/dL (ref 13.0–17.0)
Lymphocytes Relative: 35.7 % (ref 12.0–46.0)
Lymphs Abs: 2.2 10*3/uL (ref 0.7–4.0)
MCHC: 33.9 g/dL (ref 30.0–36.0)
MCV: 90.8 fl (ref 78.0–100.0)
Monocytes Absolute: 0.4 10*3/uL (ref 0.1–1.0)
Monocytes Relative: 6.9 % (ref 3.0–12.0)
Neutro Abs: 3.2 10*3/uL (ref 1.4–7.7)
Neutrophils Relative %: 53.1 % (ref 43.0–77.0)
Platelets: 198 10*3/uL (ref 150.0–400.0)
RBC: 4.48 Mil/uL (ref 4.22–5.81)
RDW: 13.2 % (ref 11.5–15.5)
WBC: 6 10*3/uL (ref 4.0–10.5)

## 2021-02-02 LAB — HEPATIC FUNCTION PANEL
ALT: 14 U/L (ref 0–53)
AST: 18 U/L (ref 0–37)
Albumin: 4.4 g/dL (ref 3.5–5.2)
Alkaline Phosphatase: 49 U/L (ref 39–117)
Bilirubin, Direct: 0.1 mg/dL (ref 0.0–0.3)
Total Bilirubin: 0.6 mg/dL (ref 0.2–1.2)
Total Protein: 6.4 g/dL (ref 6.0–8.3)

## 2021-02-02 LAB — LIPID PANEL
Cholesterol: 151 mg/dL (ref 0–200)
HDL: 43.1 mg/dL (ref >39.00)
LDL Cholesterol: 92 mg/dL (ref 0–99)
NonHDL: 108.31
Total CHOL/HDL Ratio: 4
Triglycerides: 81 mg/dL (ref 0.0–149.0)
VLDL: 16.2 mg/dL (ref 0.0–40.0)

## 2021-02-02 LAB — BASIC METABOLIC PANEL
BUN: 22 mg/dL (ref 6–23)
CO2: 32 mEq/L (ref 19–32)
Calcium: 9.4 mg/dL (ref 8.4–10.5)
Chloride: 104 mEq/L (ref 96–112)
Creatinine, Ser: 1.03 mg/dL (ref 0.40–1.50)
GFR: 71.24 mL/min (ref >60.00)
Glucose, Bld: 85 mg/dL (ref 70–99)
Potassium: 4.2 mEq/L (ref 3.5–5.1)
Sodium: 141 mEq/L (ref 135–145)

## 2021-02-02 LAB — TSH: TSH: 2.05 u[IU]/mL (ref 0.35–5.50)

## 2021-04-02 DIAGNOSIS — R3912 Poor urinary stream: Secondary | ICD-10-CM | POA: Diagnosis not present

## 2021-04-02 DIAGNOSIS — R310 Gross hematuria: Secondary | ICD-10-CM | POA: Diagnosis not present

## 2021-05-18 DIAGNOSIS — L570 Actinic keratosis: Secondary | ICD-10-CM | POA: Diagnosis not present

## 2021-05-18 DIAGNOSIS — L821 Other seborrheic keratosis: Secondary | ICD-10-CM | POA: Diagnosis not present

## 2021-05-18 DIAGNOSIS — Z85820 Personal history of malignant melanoma of skin: Secondary | ICD-10-CM | POA: Diagnosis not present

## 2021-05-18 DIAGNOSIS — Z85828 Personal history of other malignant neoplasm of skin: Secondary | ICD-10-CM | POA: Diagnosis not present

## 2021-05-18 DIAGNOSIS — D0439 Carcinoma in situ of skin of other parts of face: Secondary | ICD-10-CM | POA: Diagnosis not present

## 2021-05-18 DIAGNOSIS — X32XXXA Exposure to sunlight, initial encounter: Secondary | ICD-10-CM | POA: Diagnosis not present

## 2021-05-18 DIAGNOSIS — D0462 Carcinoma in situ of skin of left upper limb, including shoulder: Secondary | ICD-10-CM | POA: Diagnosis not present

## 2021-05-18 DIAGNOSIS — D2271 Melanocytic nevi of right lower limb, including hip: Secondary | ICD-10-CM | POA: Diagnosis not present

## 2021-05-18 DIAGNOSIS — D2261 Melanocytic nevi of right upper limb, including shoulder: Secondary | ICD-10-CM | POA: Diagnosis not present

## 2021-05-18 DIAGNOSIS — D485 Neoplasm of uncertain behavior of skin: Secondary | ICD-10-CM | POA: Diagnosis not present

## 2021-06-23 DIAGNOSIS — D0439 Carcinoma in situ of skin of other parts of face: Secondary | ICD-10-CM | POA: Diagnosis not present

## 2021-06-28 ENCOUNTER — Ambulatory Visit (INDEPENDENT_AMBULATORY_CARE_PROVIDER_SITE_OTHER): Payer: PPO | Admitting: Vascular Surgery

## 2021-06-28 ENCOUNTER — Ambulatory Visit (INDEPENDENT_AMBULATORY_CARE_PROVIDER_SITE_OTHER): Payer: PPO

## 2021-06-28 ENCOUNTER — Other Ambulatory Visit: Payer: Self-pay

## 2021-06-28 ENCOUNTER — Other Ambulatory Visit (INDEPENDENT_AMBULATORY_CARE_PROVIDER_SITE_OTHER): Payer: Self-pay | Admitting: Vascular Surgery

## 2021-06-28 ENCOUNTER — Encounter (INDEPENDENT_AMBULATORY_CARE_PROVIDER_SITE_OTHER): Payer: Self-pay | Admitting: Vascular Surgery

## 2021-06-28 VITALS — BP 153/74 | HR 52 | Resp 16 | Wt 170.6 lb

## 2021-06-28 DIAGNOSIS — E7800 Pure hypercholesterolemia, unspecified: Secondary | ICD-10-CM

## 2021-06-28 DIAGNOSIS — I714 Abdominal aortic aneurysm, without rupture, unspecified: Secondary | ICD-10-CM

## 2021-06-28 DIAGNOSIS — I724 Aneurysm of artery of lower extremity: Secondary | ICD-10-CM

## 2021-06-28 DIAGNOSIS — M159 Polyosteoarthritis, unspecified: Secondary | ICD-10-CM

## 2021-06-28 DIAGNOSIS — M150 Primary generalized (osteo)arthritis: Secondary | ICD-10-CM

## 2021-06-28 NOTE — Progress Notes (Signed)
MRN : [id number]  Kyle Meadows is a 76 y.o. (August 07, 1945) male who presents with chief complaint of check aneurysm.  History of Present Illness:   The patient presents to the office for evaluation of an abdominal aortic aneurysm. The aneurysm was found incidentally by CT scan. Patient denies abdominal pain or unusual back pain, no other abdominal complaints.  No history of an acute onset of painful blue discoloration of the toes.      No family history of AAA.    Patient denies amaurosis fugax or TIA symptoms. There is no history of claudication or rest pain symptoms of the lower extremities.  The patient denies angina or shortness of breath.   Previous duplex ultrasound from 06/28/2021 showed: AAA 2.58 cm and stable internal iliac artery aneurysms measuring 1.3 cm in the right and 1.2 cm on the left.  Triphasic blood flow throughout the aortoiliac system.  Lower extremity duplex shows Rt pop 0.90 cm and left pop 0.94 cm aneurysms  No outpatient medications have been marked as taking for the 06/28/21 encounter (Appointment) with Delana Meyer, Dolores Lory, MD.    Past Medical History:  Diagnosis Date   Arthritis    Diverticulosis, sigmoid    Environmental allergies    Frequency of urination    H/O sleep apnea    S/P  OSA surgery -- resolved   Hemorrhoids    History of BPH    History of melanoma excision    back   History of nephritis    age 47   History of squamous cell carcinoma excision    right arm, leg, face   History of urinary retention    secondary bph with obstruction   Melanoma ([place])    Urethral stricture     Past Surgical History:  Procedure Laterality Date   COLONOSCOPY WITH PROPOFOL N/A 06/13/2017   Procedure: COLONOSCOPY WITH PROPOFOL;  Surgeon: Lollie Sails, MD;  Location: [facility] ENDOSCOPY;  Service: Endoscopy;  Laterality: N/A;   CYSTOSCOPY WITH RETROGRADE URETHROGRAM N/A 05/08/2014   Procedure: CYSTOSCOPY WITH RETROGRADE URETHROGRAM;  Surgeon: Alexis Frock,  MD;  Location: [facility];  Service: Urology;  Laterality: N/A;   CYSTOSCOPY WITH URETHRAL DILATATION N/A 05/08/2014   Procedure: CYSTOSCOPY WITH URETHRAL DILATATION;  Surgeon: Alexis Frock, MD;  Location: [facility];  Service: Urology;  Laterality: N/A;   KNEE ARTHROSCOPY W/ MENISCECTOMY Right x2   last one 2005   LAPAROSCOPIC INGUINAL HERNIA REPAIR Right 2007 (approx)   ORIF  RIGHT WRIST FX  2008   retained hardware   ROBOT ASSISTED LAPAROSCOPIC RADICAL PROSTATECTOMY N/A 03/19/2014   Procedure: ROBOTIC ASSISTED LAPAROSCOPIC SIMPLE PROSTATECTOMY;  Surgeon: Alexis Frock, MD;  Location: [facility];  Service: Urology;  Laterality: N/A;   TONSILLECTOMY  age 24   UVULOPALATOPHARYNGOPLASTY  x5  last one 47    Social History Social History   Tobacco Use   Smoking status: Never   Smokeless tobacco: Never  Vaping Use   Vaping Use: Never used  Substance Use Topics   Alcohol use: Yes    Alcohol/week: 0.0 standard drinks    Comment: rare   Drug use: No    Family History Family History  Problem Relation Age of Onset   Heart disease Father        CABG x 4   Arthritis Father    Glaucoma Father    Hepatitis C Mother    Thyroid disease Sister     Allergies  Allergen  Reactions   Hydrocodone Other (See Comments)    hyper   Percocet [Oxycodone-Acetaminophen] Other (See Comments)    "keeps me wide awake"     REVIEW OF SYSTEMS (Negative unless checked)  Constitutional: '[]'$ Weight loss  '[]'$ Fever  '[]'$ Chills Cardiac: '[]'$ Chest pain   '[]'$ Chest pressure   '[]'$ Palpitations   '[]'$ Shortness of breath when laying flat   '[]'$ Shortness of breath with exertion. Vascular:  '[]'$ Pain in legs with walking   '[]'$ Pain in legs at rest  '[]'$ History of DVT   '[]'$ Phlebitis   '[]'$ Swelling in legs   '[]'$ Varicose veins   '[]'$ Non-healing ulcers Pulmonary:   '[]'$ Uses home oxygen   '[]'$ Productive cough   '[]'$ Hemoptysis   '[]'$ Wheeze  '[]'$ COPD   '[]'$ Asthma Neurologic:  '[]'$ Dizziness   '[]'$ Seizures   '[]'$ History of stroke    '[]'$ History of TIA  '[]'$ Aphasia   '[]'$ Vissual changes   '[]'$ Weakness or numbness in arm   '[]'$ Weakness or numbness in leg Musculoskeletal:   '[]'$ Joint swelling   '[]'$ Joint pain   '[]'$ Low back pain Hematologic:  '[]'$ Easy bruising  '[]'$ Easy bleeding   '[]'$ Hypercoagulable state   '[]'$ Anemic Gastrointestinal:  '[]'$ Diarrhea   '[]'$ Vomiting  '[]'$ Gastroesophageal reflux/heartburn   '[]'$ Difficulty swallowing. Genitourinary:  '[]'$ Chronic kidney disease   '[]'$ Difficult urination  '[]'$ Frequent urination   '[]'$ Blood in urine Skin:  '[]'$ Rashes   '[]'$ Ulcers  Psychological:  '[]'$ History of anxiety   '[]'$  History of major depression.  Physical Examination  There were no vitals filed for this visit. There is no height or weight on file to calculate BMI. Gen: WD/WN, NAD Head: [place]/AT, No temporalis wasting.  Ear/Nose/Throat: Hearing grossly intact, nares w/o erythema or drainage Eyes: PER, EOMI, sclera nonicteric.  Neck: Supple, no masses.  No bruit or JVD.  Pulmonary:  Good air movement, no audible wheezing, no use of accessory muscles.  Cardiac: RRR, normal S1, S2, no Murmurs. Vascular:  enlarge popliteal pulses Vessel Right Left  Radial Palpable Palpable  Carotid Palpable Palpable  PT Palpable Palpable  DP Palpable Palpable  Gastrointestinal: soft, non-distended. No guarding/no peritoneal signs.  Musculoskeletal: M/S 5/5 throughout.  No visible deformity.  Neurologic: CN 2-12 intact. Pain and light touch intact in extremities.  Symmetrical.  Speech is fluent. Motor exam as listed above. Psychiatric: Judgment intact, Mood & affect appropriate for pt's clinical situation. Dermatologic: No rashes or ulcers noted.  No changes consistent with cellulitis.   CBC Lab Results  Component Value Date   WBC 6.0 02/02/2021   HGB 13.8 02/02/2021   HCT 40.7 02/02/2021   MCV 90.8 02/02/2021   PLT 198.0 02/02/2021    BMET    Component Value Date/Time   NA 141 02/02/2021 0802   NA 142 04/09/2012 0000   K 4.2 02/02/2021 0802   CL 104 02/02/2021 0802    CO2 32 02/02/2021 0802   GLUCOSE 85 02/02/2021 0802   BUN 22 02/02/2021 0802   BUN 22 (A) 04/09/2012 0000   CREATININE 1.03 02/02/2021 0802   CALCIUM 9.4 02/02/2021 0802   GFRNONAA >60 10/21/2017 1754   GFRAA >60 10/21/2017 1754   CrCl cannot be calculated (Patient's most recent lab result is older than the maximum 21 days allowed.).  COAG No results found for: INR, PROTIME  Radiology No results found.   Assessment/Plan 1. Abdominal aortic aneurysm (AAA) without rupture, unspecified part No surgery or intervention at this time. The patient has an asymptomatic abdominal aortic aneurysm that is less than 4 cm in maximal diameter.  I have discussed the natural history of abdominal aortic aneurysm and  the small risk of rupture for aneurysm less than 5 cm in size.  However, as these small aneurysms tend to enlarge over time, continued surveillance with ultrasound or CT scan is mandatory.  I have also discussed optimizing medical management with hypertension and lipid control and the importance of abstinence from tobacco.  The patient is also encouraged to exercise a minimum of 30 minutes 4 times a week.  Should the patient develop new onset abdominal or back pain or signs of peripheral embolization they are instructed to seek medical attention immediately and to alert the physician providing care that they have an aneurysm.  The patient voices their understanding. The patient will return in 24 months with an aortic duplex.  - VAS US AORTA/IVC/ILIACS; Future  2. Popliteal aneurysm (HCC) No surgery or intervention at this time.  The patient has an asymptomatic popliteal artery aneurysm that is less than 2.5 cm in maximal diameter.  I have discussed the natural history of popliteal aneurysm and the small risk of thrombosis for aneurysm less than 2.5 cm in size.  However, as these small aneurysms tend to enlarge over time, continued surveillance with ultrasound is mandatory.   I have also  discussed optimizing medical management with hypertension and lipid control and the importance of abstinence from tobacco.  The patient is also encouraged to exercise a minimum of 30 minutes 4 times a week.   Should the patient develop new leg pain or signs of peripheral embolization they are instructed to seek medical attention immediately and to alert the physician providing care that they have an aneurysm.  The patient voices their understanding.  - VAS [place] LOWER EXTREMITY ARTERIAL DUPLEX; Future  3. Hypercholesterolemia Continue statin as ordered and reviewed, no changes at this time   4. Primary osteoarthritis involving multiple joints Continue NSAID medications as already ordered, these medications have been reviewed and there are no changes at this time.  Continued activity and therapy was stressed.     Hortencia Pilar, MD  06/28/2021 8:32 AM

## 2021-06-29 ENCOUNTER — Encounter (INDEPENDENT_AMBULATORY_CARE_PROVIDER_SITE_OTHER): Payer: Self-pay | Admitting: Vascular Surgery

## 2021-06-30 DIAGNOSIS — D0462 Carcinoma in situ of skin of left upper limb, including shoulder: Secondary | ICD-10-CM | POA: Diagnosis not present

## 2021-07-21 DIAGNOSIS — H259 Unspecified age-related cataract: Secondary | ICD-10-CM | POA: Diagnosis not present

## 2021-07-21 DIAGNOSIS — H524 Presbyopia: Secondary | ICD-10-CM | POA: Diagnosis not present

## 2021-07-21 DIAGNOSIS — H52223 Regular astigmatism, bilateral: Secondary | ICD-10-CM | POA: Diagnosis not present

## 2021-07-21 DIAGNOSIS — H5203 Hypermetropia, bilateral: Secondary | ICD-10-CM | POA: Diagnosis not present

## 2021-07-21 DIAGNOSIS — Z135 Encounter for screening for eye and ear disorders: Secondary | ICD-10-CM | POA: Diagnosis not present

## 2021-07-27 ENCOUNTER — Ambulatory Visit (INDEPENDENT_AMBULATORY_CARE_PROVIDER_SITE_OTHER): Payer: PPO | Admitting: Internal Medicine

## 2021-07-27 VITALS — BP 122/70 | HR 62 | Temp 97.9°F | Resp 16 | Ht 69.0 in | Wt 170.4 lb

## 2021-07-27 DIAGNOSIS — N400 Enlarged prostate without lower urinary tract symptoms: Secondary | ICD-10-CM

## 2021-07-27 DIAGNOSIS — E7800 Pure hypercholesterolemia, unspecified: Secondary | ICD-10-CM

## 2021-07-27 DIAGNOSIS — I714 Abdominal aortic aneurysm, without rupture, unspecified: Secondary | ICD-10-CM

## 2021-07-27 DIAGNOSIS — Z0000 Encounter for general adult medical examination without abnormal findings: Secondary | ICD-10-CM

## 2021-07-27 DIAGNOSIS — R42 Dizziness and giddiness: Secondary | ICD-10-CM | POA: Diagnosis not present

## 2021-07-27 DIAGNOSIS — I723 Aneurysm of iliac artery: Secondary | ICD-10-CM

## 2021-07-27 LAB — LIPID PANEL
Cholesterol: 144 mg/dL (ref 0–200)
HDL: 43.1 mg/dL (ref >39.00)
LDL Cholesterol: 86 mg/dL (ref 0–99)
NonHDL: 101.33
Total CHOL/HDL Ratio: 3
Triglycerides: 76 mg/dL (ref 0.0–149.0)
VLDL: 15.2 mg/dL (ref 0.0–40.0)

## 2021-07-27 LAB — BASIC METABOLIC PANEL
BUN: 26 mg/dL — ABNORMAL HIGH (ref 6–23)
CO2: 33 mEq/L — ABNORMAL HIGH (ref 19–32)
Calcium: 9.7 mg/dL (ref 8.4–10.5)
Chloride: 104 mEq/L (ref 96–112)
Creatinine, Ser: 0.93 mg/dL (ref 0.40–1.50)
GFR: 80.26 mL/min (ref >60.00)
Glucose, Bld: 82 mg/dL (ref 70–99)
Potassium: 4.5 mEq/L (ref 3.5–5.1)
Sodium: 141 mEq/L (ref 135–145)

## 2021-07-27 LAB — HEPATIC FUNCTION PANEL
ALT: 15 U/L (ref 0–53)
AST: 19 U/L (ref 0–37)
Albumin: 4.7 g/dL (ref 3.5–5.2)
Alkaline Phosphatase: 47 U/L (ref 39–117)
Bilirubin, Direct: 0.1 mg/dL (ref 0.0–0.3)
Total Bilirubin: 0.6 mg/dL (ref 0.2–1.2)
Total Protein: 6.6 g/dL (ref 6.0–8.3)

## 2021-07-27 NOTE — Progress Notes (Addendum)
[name], male   DOB: 11-Sep-1945, 76 y.o.   MRN: [id number] ? ? ?Subjective:  ? ? [name], male    DOB: November 28, 1945, 76 y.o.   MRN: [id number] ? ?This visit occurred during the SARS-CoV-2 public health emergency.  Safety protocols were in place, including screening questions prior to the visit, additional usage of staff PPE, and extensive cleaning of exam room while observing appropriate contact time as indicated for disinfecting solutions.  ? ?Patient here for her physical exam.  ? ?Chief Complaint  ?Patient presents with  ? Annual Exam  ? .  ? ?HPI ?Doing well.  Stays active.  Plays golf. No chest pain or sob reported.  No abdominal pain or bowel change reported.  Does report dizziness - pretty constant. No room spinning.  No headache reported.  Some chronic sinus congestion/nasal congestion.  Sees an allergist.  Has f/u next week.   ? ? ?Past Medical History:  ?Diagnosis Date  ? Arthritis   ? Diverticulosis, sigmoid   ? Environmental allergies   ? Frequency of urination   ? H/O sleep apnea   ? S/P  OSA surgery -- resolved  ? Hemorrhoids   ? History of BPH   ? History of melanoma excision   ? back  ? History of nephritis   ? age 6  ? History of squamous cell carcinoma excision   ? right arm, leg, face  ? History of urinary retention   ? secondary bph with obstruction  ? Melanoma ([place])   ? Urethral stricture   ? ?Past Surgical History:  ?Procedure Laterality Date  ? COLONOSCOPY WITH PROPOFOL N/A 06/13/2017  ? Procedure: COLONOSCOPY WITH PROPOFOL;  Surgeon: Lollie Sails, MD;  Location: [facility] ENDOSCOPY;  Service: Endoscopy;  Laterality: N/A;  ? CYSTOSCOPY WITH RETROGRADE URETHROGRAM N/A 05/08/2014  ? Procedure: CYSTOSCOPY WITH RETROGRADE URETHROGRAM;  Surgeon: Alexis Frock, MD;  Location: [facility];  Service: Urology;  Laterality: N/A;  ? CYSTOSCOPY WITH URETHRAL DILATATION N/A 05/08/2014  ? Procedure: CYSTOSCOPY WITH URETHRAL DILATATION;  Surgeon: Alexis Frock,  MD;  Location: [facility];  Service: Urology;  Laterality: N/A;  ? KNEE ARTHROSCOPY W/ MENISCECTOMY Right x2   last one 2005  ? LAPAROSCOPIC INGUINAL HERNIA REPAIR Right 2007 (approx)  ? ORIF  RIGHT WRIST FX  2008  ? retained hardware  ? ROBOT ASSISTED LAPAROSCOPIC RADICAL PROSTATECTOMY N/A 03/19/2014  ? Procedure: ROBOTIC ASSISTED LAPAROSCOPIC SIMPLE PROSTATECTOMY;  Surgeon: Alexis Frock, MD;  Location: [facility];  Service: Urology;  Laterality: N/A;  ? TONSILLECTOMY  age 30  ? UVULOPALATOPHARYNGOPLASTY  x5  last one 1995  ? ?Family History  ?Problem Relation Age of Onset  ? Heart disease Father   ?     CABG x 4  ? Arthritis Father   ? Glaucoma Father   ? Hepatitis C Mother   ? Thyroid disease Sister   ? ?Social History  ? ?Socioeconomic History  ? Marital status: Married  ?  Spouse name: Not on file  ? Number of children: Not on file  ? Years of education: Not on file  ? Highest education level: Not on file  ?Occupational History  ? Not on file  ?Tobacco Use  ? Smoking status: Never  ? Smokeless tobacco: Never  ?Vaping Use  ? Vaping Use: Never used  ?Substance and Sexual Activity  ? Alcohol use: Yes  ?  Alcohol/week: 0.0 standard drinks  ?  Comment:  rare  ? Drug use: No  ? Sexual activity: Not on file  ?Other Topics Concern  ? Not on file  ?Social History Narrative  ? Not on file  ? ?Social Determinants of Health  ? ?Financial Resource Strain: Low Risk   ? Difficulty of Paying Living Expenses: Not hard at all  ?Food Insecurity: No Food Insecurity  ? Worried About Charity fundraiser in the Last Year: Never true  ? Ran Out of Food in the Last Year: Never true  ?Transportation Needs: No Transportation Needs  ? Lack of Transportation (Medical): No  ? Lack of Transportation (Non-Medical): No  ?Physical Activity: Sufficiently Active  ? Days of Exercise per Week: 3 days  ? Minutes of Exercise per Session: 60 min  ?Stress: No Stress Concern Present  ? Feeling of Stress : Not at all  ?Social Connections:  Unknown  ? Frequency of Communication with Friends and Family: Not on file  ? Frequency of Social Gatherings with Friends and Family: More than three times a week  ? Attends Religious Services: Not on file  ? Active Member of Clubs or Organizations: Not on file  ? Attends Archivist Meetings: Not on file  ? Marital Status: Married  ? ? ? ?Review of Systems  ?Constitutional:  Negative for appetite change and unexpected weight change.  ?HENT:  Negative for congestion, sinus pressure and sore throat.   ?Eyes:  Negative for pain and visual disturbance.  ?Respiratory:  Negative for cough, chest tightness and shortness of breath.   ?Cardiovascular:  Negative for chest pain, palpitations and leg swelling.  ?Gastrointestinal:  Negative for abdominal pain, diarrhea, nausea and vomiting.  ?Genitourinary:  Negative for difficulty urinating and dysuria.  ?Musculoskeletal:  Negative for back pain and joint swelling.  ?Skin:  Negative for color change and rash.  ?Neurological:  Positive for dizziness. Negative for headaches.  ?Hematological:  Negative for adenopathy. Does not bruise/bleed easily.  ?Psychiatric/Behavioral:  Negative for decreased concentration and dysphoric mood.   ? ?   ?Objective:  ?  ? ?BP 122/70   Pulse 62   Temp 97.9 ?F (36.6 ?C)   Resp 16   Ht '5\' 9"'$  (1.753 m)   Wt 170 lb 6.4 oz (77.3 kg)   SpO2 97%   BMI 25.16 kg/m?  ?Wt Readings from Last 3 Encounters:  ?07/27/21 170 lb 6.4 oz (77.3 kg)  ?06/28/21 170 lb 9.6 oz (77.4 kg)  ?01/26/21 170 lb (77.1 kg)  ? ? ?Physical Exam ?Constitutional:   ?   General: He is not in acute distress. ?   Appearance: He is well-developed.  ?HENT:  ?   Head: Normocephalic and atraumatic.  ?   Nose: Nose normal.  ?   Mouth/Throat:  ?   Pharynx: No oropharyngeal exudate.  ?Eyes:  ?   General:     ?   Right eye: No discharge.     ?   Left eye: No discharge.  ?   Conjunctiva/sclera: Conjunctivae normal.  ?Neck:  ?   Thyroid: No thyromegaly.  ?Cardiovascular:  ?   Rate  and Rhythm: Normal rate and regular rhythm.  ?Pulmonary:  ?   Effort: No respiratory distress.  ?   Breath sounds: Normal breath sounds. No wheezing.  ?Abdominal:  ?   General: Bowel sounds are normal.  ?   Palpations: Abdomen is soft.  ?   Tenderness: There is no abdominal tenderness.  ?Musculoskeletal:     ?  General: No tenderness.  ?   Cervical back: Neck supple.  ?Lymphadenopathy:  ?   Cervical: No cervical adenopathy.  ?Skin: ?   General: Skin is warm and dry.  ?   Findings: No rash.  ?Neurological:  ?   Mental Status: He is alert and oriented to person, place, and time.  ?Psychiatric:     ?   Behavior: Behavior normal.  ? ? ? ?Outpatient Encounter Medications as of 07/27/2021  ?Medication Sig  ? Acetylcarnitine HCl (ACETYL L-CARNITINE PO) Take 300 mg by mouth.  ? Alpha-Lipoic Acid 300 MG TABS Take by mouth.  ? [place]-Glucosamine-Vit D (OSTEO BI-FLEX ONE PER DAY PO) Take by mouth.  ? Coenzyme Q10 (COQ10 PO) Take by mouth.  ? DOCUSATE SODIUM PO Take by mouth.  ? fexofenadine-pseudoephedrine (ALLEGRA-D 24) 180-240 MG 24 hr tablet Take 1 tablet by mouth daily.  ? finasteride (PROSCAR) 5 MG tablet Take 5 mg by mouth daily.  ? fluticasone (FLONASE) 50 MCG/ACT nasal spray SMARTSIG:1-2 Spray(s) Both Nares Daily  ? ipratropium (ATROVENT) 0.06 % nasal spray SMARTSIG:1-2 Spray(s) Both Nares 3 Times Daily  ? levocetirizine (XYZAL) 5 MG tablet Take 5 mg by mouth at bedtime.  ? meloxicam (MOBIC) 7.5 MG tablet Take 7.5 mg by mouth daily.  ? montelukast (SINGULAIR) 10 MG tablet Take 10 mg by mouth at bedtime.  ? Multiple Vitamins-Minerals (CENTRUM SILVER ADULT 50+ PO) Take 1 tablet by mouth daily.  ? Omega-3 Fatty Acids (FISH OIL PO) Take by mouth.  ? oxybutynin (DITROPAN) 5 MG tablet TK 1 T PO Q 8 H PRF URINARY FREQUENCY OR URGENCY  ? rosuvastatin (CRESTOR) 10 MG tablet TAKE 2 TABLETS ON MONDAY, WEDNESDAY AND FRIDAY AND ONE TABLET ON ALL OTHER DAYS OF THE WEEK  ? triamcinolone cream (KENALOG) 0.1 % Apply topically 2 (two)  times daily.  ? ?No facility-administered encounter medications on file as of 07/27/2021.  ?  ? ?Lab Results  ?Component Value Date  ? WBC 6.0 02/02/2021  ? HGB 13.8 02/02/2021  ? HCT 40.7 02/02/2021  ? PLT 198.

## 2021-07-27 NOTE — Assessment & Plan Note (Addendum)
Physical today 06/26/21.  Colonoscopy 2019.  Recommended f/u in 5 years.  Prostate issues - followed by urology.  ?

## 2021-07-28 ENCOUNTER — Other Ambulatory Visit: Payer: Self-pay

## 2021-07-28 ENCOUNTER — Encounter: Payer: Self-pay | Admitting: Internal Medicine

## 2021-07-28 ENCOUNTER — Telehealth: Payer: Self-pay

## 2021-07-28 MED ORDER — ROSUVASTATIN CALCIUM 20 MG PO TABS
20.0000 mg | ORAL_TABLET | Freq: Every day | ORAL | 1 refills | Status: DC
Start: 2021-07-28 — End: 2021-10-28

## 2021-07-28 NOTE — Assessment & Plan Note (Signed)
Dizziness as outlined.  Constant.  Not positional.  No room spinning.  On finasteride.  Blood pressure is doing well.  No increased sinus issues.  (chronic issues - on medication).  Check routine labs.  No chest pain, increased heart rate or palpitations.  Given persistence and given no clear identifiable etiology, will have neurology evaluate to confirm no other neurological etiology.   ?

## 2021-07-28 NOTE — Assessment & Plan Note (Signed)
On crestor.  Low cholesterol diet and exercise.  Follow lipid panel and liver function tests.   

## 2021-07-28 NOTE — Addendum Note (Signed)
Addended by: Alisa Graff on: 07/28/2021 03:10 AM ? ? Modules accepted: Orders ? ?

## 2021-07-28 NOTE — Assessment & Plan Note (Signed)
Evaluated by vascular 06/2021.  Recommended f/u in 24 months.  

## 2021-07-28 NOTE — Telephone Encounter (Signed)
Rosuvastatin '20mg'$  added to pt chart ?

## 2021-07-28 NOTE — Assessment & Plan Note (Signed)
S/p simple prostatectomy.  Followed by urology. Continue finasteride and oxybutynin.  

## 2021-07-28 NOTE — Assessment & Plan Note (Signed)
Evaluated 06/29/21 - Previous duplex ultrasound from 06/28/2021 showed: AAA 2.58 cm and stable internal iliac artery aneurysms measuring 1.3 cm in the right and 1.2 cm on the left.  Triphasic blood flow throughout the aortoiliac system.  Lower extremity duplex shows Rt pop 0.90 cm and left pop 0.94 cm aneurysms. Recommended f/u ultrasound in 24 months.  

## 2021-08-05 DIAGNOSIS — J300 Vasomotor rhinitis: Secondary | ICD-10-CM | POA: Diagnosis not present

## 2021-08-05 DIAGNOSIS — Z880 Allergy status to penicillin: Secondary | ICD-10-CM | POA: Diagnosis not present

## 2021-08-10 DIAGNOSIS — H25812 Combined forms of age-related cataract, left eye: Secondary | ICD-10-CM | POA: Diagnosis not present

## 2021-08-10 DIAGNOSIS — Z01818 Encounter for other preprocedural examination: Secondary | ICD-10-CM | POA: Diagnosis not present

## 2021-08-10 DIAGNOSIS — H25811 Combined forms of age-related cataract, right eye: Secondary | ICD-10-CM | POA: Diagnosis not present

## 2021-08-26 DIAGNOSIS — H25811 Combined forms of age-related cataract, right eye: Secondary | ICD-10-CM | POA: Diagnosis not present

## 2021-09-04 ENCOUNTER — Encounter: Payer: Self-pay | Admitting: Internal Medicine

## 2021-09-04 DIAGNOSIS — J300 Vasomotor rhinitis: Secondary | ICD-10-CM | POA: Insufficient documentation

## 2021-09-24 DIAGNOSIS — H25812 Combined forms of age-related cataract, left eye: Secondary | ICD-10-CM | POA: Diagnosis not present

## 2021-09-24 DIAGNOSIS — H269 Unspecified cataract: Secondary | ICD-10-CM | POA: Diagnosis not present

## 2021-10-27 ENCOUNTER — Other Ambulatory Visit: Payer: Self-pay | Admitting: Internal Medicine

## 2021-11-09 ENCOUNTER — Telehealth: Payer: Self-pay | Admitting: Internal Medicine

## 2021-11-09 NOTE — Telephone Encounter (Signed)
Pt called stating he has a cold, his chest is aching, has a cough that is rattly and when he breathes he is wheezing. Pt stated he had a temp of 102 yesterday. Sent to access nurse

## 2021-11-09 NOTE — Telephone Encounter (Signed)
S/w pt - [place]. Grandkids are sick with hand foot and mouth disease, pt and pt wife have been exposed.  Pt having cold like symptoms - productive cough, rattling in chest, wheezing, congestion. Pt denies difficulty breathing or shortness of breath  Pt had a fever of 102 yesterday, used tylenol to control. No fever as of yet today. Pt was asking for something to be prescribed.  Pt advised needs to be evaluated today, to proceed to closest urgent care in his area. Pt gave verbal agreement and stated he will find urgent care local to him and seek treatment there.

## 2021-11-09 NOTE — Telephone Encounter (Signed)
Agree if fever, wheezing, etc - need for evaluation.

## 2021-11-14 ENCOUNTER — Ambulatory Visit
Admission: RE | Admit: 2021-11-14 | Discharge: 2021-11-14 | Disposition: A | Discharge status: Home / Self Care | Payer: PPO | Source: Ambulatory Visit | Attending: Emergency Medicine | Admitting: Emergency Medicine

## 2021-11-14 VITALS — BP 155/64 | HR 64 | Temp 98.3°F | Resp 17

## 2021-11-14 DIAGNOSIS — J069 Acute upper respiratory infection, unspecified: Secondary | ICD-10-CM

## 2021-11-14 DIAGNOSIS — R051 Acute cough: Secondary | ICD-10-CM

## 2021-11-14 MED ORDER — PROMETHAZINE-DM 6.25-15 MG/5ML PO SYRP: 5.0000 mL | ORAL_SOLUTION | Freq: Four times a day (QID) | ORAL | 0 refills | Status: DC | PRN

## 2021-11-14 MED ORDER — AMOXICILLIN-POT CLAVULANATE 875-125 MG PO TABS: 1.0000 | ORAL_TABLET | Freq: Two times a day (BID) | ORAL | 0 refills | Status: AC

## 2021-11-14 MED ORDER — BENZONATATE 100 MG PO CAPS: 200.0000 mg | ORAL_CAPSULE | Freq: Three times a day (TID) | ORAL | 0 refills | Status: DC | PRN

## 2021-11-14 MED ORDER — IPRATROPIUM BROMIDE 0.06 % NA SOLN: 2.0000 | Freq: Four times a day (QID) | NASAL | 12 refills | Status: AC

## 2021-11-14 NOTE — ED Triage Notes (Signed)
For a week having cold like symptoms. Nasal congestion, sore throat, low grade fever, cough that is productive getting up mucous. Taking OTC medications that aren't helping

## 2021-11-14 NOTE — Discharge Instructions (Addendum)
The Augmentin twice daily with food for 10 days for treatment of your URI.  Perform sinus irrigation 2-3 times a day with a [employer] sinus rinse kit and distilled water.  Do not use tap water.  You can use plain over-the-counter Mucinex every 6 hours to break up the stickiness of the mucus so your body can clear it.  Increase your oral fluid intake to thin out your mucus so that is also able for your body to clear more easily.  Take an over-the-counter probiotic, such as Culturelle-align-activia, 1 hour after each dose of antibiotic to prevent diarrhea.  Use the Atrovent nasal spray, 2 squirts in each nostril every 6 hours, as needed for runny nose and postnasal drip.  Use the Tessalon Perles every 8 hours during the day.  Take them with a small sip of water.  They may give you some numbness to the base of your tongue or a metallic taste in your mouth, this is normal.  Use the Promethazine DM cough syrup at bedtime for cough and congestion.  It will make you drowsy so do not take it during the day.  If you develop any new or worsening symptoms return for reevaluation or see your primary care provider.

## 2021-11-14 NOTE — ED Provider Notes (Signed)
Kyle Meadows    CSN: [id number] Arrival date & time: 11/14/21  1225      History   Chief Complaint Chief Complaint  Patient presents with   Nasal Congestion    Cough.  Throat sore.  Low grade fever - Entered by patient   Cough    HPI Kyle Meadows is a 76 y.o. male.   HPI  76 year old male here for evaluation of respiratory complaints.  Patient reports that for the last week he has been experiencing fever up to 102, nasal congestion with green nasal discharge, sore throat, and a cough that is productive for the same green sputum.  He denies any ear pain or pressure, shortness of breath, or wheezing.  Patient ports that his grandchildren came up to visit from [place] and brought respiratory infection with them.  Patient reports that he is fever has come down to 99 over the past 1 to 2 days.  Past Medical History:  Diagnosis Date   Arthritis    Diverticulosis, sigmoid    Environmental allergies    Frequency of urination    H/O sleep apnea    S/P  OSA surgery -- resolved   Hemorrhoids    History of BPH    History of melanoma excision    back   History of nephritis    age 76   History of squamous cell carcinoma excision    right arm, leg, face   History of urinary retention    secondary bph with obstruction   Melanoma ([place])    Urethral stricture     Patient Active Problem List   Diagnosis Date Noted   Vasomotor rhinitis 09/04/2021   History of squamous cell carcinoma of skin 06/22/2020   Sleep apnea 06/22/2020   Knee pain 06/22/2020   AAA (abdominal aortic aneurysm) without rupture ([place]) 06/22/2020   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   Asthma, chronic 06/22/2020   Low back pain 05/27/2020   Dizziness 11/17/2018   Cervical radiculopathy 10/01/2018   Patellar tendonitis 10/01/2018   Osteoarthritis of knee 07/27/2017   Abdominal pain 12/30/2016   Iliac artery aneurysm ([place]) 11/22/2016   Hypercholesterolemia 12/08/2015   Health care maintenance  07/13/2014   DJD (degenerative joint disease) 10/09/2012   Enlarged prostate 10/09/2012   Other nonmedicinal substance allergy status 10/09/2012   Malignant neoplasm of skin 10/09/2012   Hemorrhoids 10/09/2012   Benign enlargement of prostate 10/09/2012    Past Surgical History:  Procedure Laterality Date   COLONOSCOPY WITH PROPOFOL N/A 06/13/2017   Procedure: COLONOSCOPY WITH PROPOFOL;  Surgeon: Lollie Sails, MD;  Location[facility];  Service: Endoscopy;  Laterality: N/A;   CYSTOSCOPY WITH RETROGRADE URETHROGRAM N/A 05/08/2014   Procedure: CYSTOSCOPY WITH RETROGRADE URETHROGRAM;  Surgeon: Alexis Frock, MD;  Location: [facility];  Service: Urology;  Laterality: N/A;   CYSTOSCOPY WITH URETHRAL DILATATION N/A 05/08/2014   Procedure: CYSTOSCOPY WITH URETHRAL DILATATION;  Surgeon: Alexis Frock, MD;  Location: [facility];  Service: Urology;  Laterality: N/A;   KNEE ARTHROSCOPY W/ MENISCECTOMY Right x2   last one 2005   LAPAROSCOPIC INGUINAL HERNIA REPAIR Right 2007 (approx)   ORIF  RIGHT WRIST FX  2008   retained hardware   ROBOT ASSISTED LAPAROSCOPIC RADICAL PROSTATECTOMY N/A 03/19/2014   Procedure: ROBOTIC ASSISTED LAPAROSCOPIC SIMPLE PROSTATECTOMY;  Surgeon: Alexis Frock, MD;  Location: [facility];  Service: Urology;  Laterality: N/A;   TONSILLECTOMY  age 7   UVULOPALATOPHARYNGOPLASTY  x5  last one  1995       Home Medications    Prior to Admission medications   Medication Sig Start Date End Date Taking? Authorizing Provider  amoxicillin-clavulanate (AUGMENTIN) 875-125 MG tablet Take 1 tablet by mouth every 12 (twelve) hours for 10 days. 11/14/21 11/24/21 Yes Margarette Canada, NP  benzonatate (TESSALON) 100 MG capsule Take 2 capsules (200 mg total) by mouth 3 (three) times daily as needed for cough. 11/14/21  Yes Margarette Canada, NP  ipratropium (ATROVENT) 0.06 % nasal spray Place 2 sprays into both nostrils 4 (four) times daily. 11/14/21  Yes Margarette Canada, NP  promethazine-dextromethorphan (PROMETHAZINE-DM) 6.25-15 MG/5ML syrup Take 5 mLs by mouth 4 (four) times daily as needed. 11/14/21  Yes Margarette Canada, NP  Acetylcarnitine HCl (ACETYL L-CARNITINE PO) Take 300 mg by mouth.    [provider]  Alpha-Lipoic Acid 300 MG TABS Take by mouth.    [provider]  Boswellia-Glucosamine-Vit D (OSTEO BI-FLEX ONE PER DAY PO) Take by mouth.    [provider]  Coenzyme Q10 (COQ10 PO) Take by mouth.    [provider]  DOCUSATE SODIUM PO Take by mouth.    [provider]  fexofenadine-pseudoephedrine (ALLEGRA-D 24) 180-240 MG 24 hr tablet Take 1 tablet by mouth daily.    [provider]  finasteride (PROSCAR) 5 MG tablet Take 5 mg by mouth daily. 11/03/20   [provider]  fluticasone Asencion Islam) 50 MCG/ACT nasal spray SMARTSIG:1-2 Spray(s) Both Nares Daily 06/17/20   [provider]  levocetirizine (XYZAL) 5 MG tablet Take 5 mg by mouth at bedtime. 03/07/19   [provider]  meloxicam (MOBIC) 7.5 MG tablet Take 7.5 mg by mouth daily. 12/22/20   [provider]  montelukast (SINGULAIR) 10 MG tablet Take 10 mg by mouth at bedtime. 03/30/19   [provider]  Multiple Vitamins-Minerals (CENTRUM SILVER ADULT 50+ PO) Take 1 tablet by mouth daily.    [provider]  Omega-3 Fatty Acids (FISH OIL PO) Take by mouth.    [provider]  oxybutynin (DITROPAN) 5 MG tablet TK 1 T PO Q 8 H PRF URINARY FREQUENCY OR URGENCY 03/31/15   [provider]  rosuvastatin (CRESTOR) 20 MG tablet TAKE 1 TABLET BY MOUTH ONCE DAILY. 10/28/21   Einar Pheasant, MD  triamcinolone cream (KENALOG) 0.1 % Apply topically 2 (two) times daily. 12/23/19   [provider]    Family History Family History  Problem Relation Age of Onset   Heart disease Father        CABG x 4   Arthritis Father    Glaucoma Father    Hepatitis C Mother    Thyroid disease  Sister     Social History Social History   Tobacco Use   Smoking status: Never   Smokeless tobacco: Never  Vaping Use   Vaping Use: Never used  Substance Use Topics   Alcohol use: Yes    Alcohol/week: 0.0 standard drinks of alcohol    Comment: rare   Drug use: No     Allergies   Hydrocodone and Percocet [oxycodone-acetaminophen]   Review of Systems Review of Systems  Constitutional:  Positive for fever.  HENT:  Positive for congestion, rhinorrhea and sore throat. Negative for ear pain.   Respiratory:  Positive for cough. Negative for shortness of breath and wheezing.   Hematological: Negative.   Psychiatric/Behavioral: Negative.       Physical Exam Triage Vital Signs ED Triage Vitals  Enc Vitals  Group     BP      Pulse      Resp      Temp      Temp src      SpO2      Weight      Height      Head Circumference      Peak Flow      Pain Score      Pain Loc      Pain Edu?      Excl. in [place]?    No data found.  Updated Vital Signs BP (!) 155/64 (BP Location: Left Arm)   Pulse 64   Temp 98.3 F (36.8 C) (Oral)   Resp 17   SpO2 95%   Visual Acuity Right Eye Distance:   Left Eye Distance:   Bilateral Distance:    Right Eye Near:   Left Eye Near:    Bilateral Near:     Physical Exam Vitals and nursing note reviewed.  Constitutional:      Appearance: Normal appearance. He is not ill-appearing.  HENT:     Head: Normocephalic and atraumatic.     Right Ear: Tympanic membrane, ear canal and external ear normal. There is no impacted cerumen.     Left Ear: Tympanic membrane, ear canal and external ear normal. There is no impacted cerumen.     Nose: Congestion and rhinorrhea present.     Mouth/Throat:     Mouth: Mucous membranes are moist.     Pharynx: Oropharynx is clear. Posterior oropharyngeal erythema present. No oropharyngeal exudate.  Cardiovascular:     Rate and Rhythm: Normal rate and regular rhythm.     Pulses: Normal pulses.     Heart  sounds: Normal heart sounds. No murmur heard.    No friction rub. No gallop.  Pulmonary:     Effort: Pulmonary effort is normal.     Breath sounds: Normal breath sounds. No wheezing, rhonchi or rales.  Musculoskeletal:     Cervical back: Normal range of motion and neck supple.  Lymphadenopathy:     Cervical: No cervical adenopathy.  Skin:    General: Skin is warm and dry.     Capillary Refill: Capillary refill takes less than 2 seconds.     Findings: No erythema or rash.  Neurological:     General: No focal deficit present.     Mental Status: He is alert and oriented to person, place, and time.  Psychiatric:        Mood and Affect: Mood normal.        Behavior: Behavior normal.        Thought Content: Thought content normal.        Judgment: Judgment normal.      UC Treatments / Results  Labs (all labs ordered are listed, but only abnormal results are displayed) Labs Reviewed - No data to display  EKG   Radiology No results found.  Procedures Procedures (including critical care time)  Medications Ordered in UC Medications - No data to display  Initial Impression / Assessment and Plan / UC Course  I have reviewed the triage vital signs and the nursing notes.  Pertinent labs & imaging results that were available during my care of the patient were reviewed by me and considered in my medical decision making (see chart for details).  Patient is a very pleasant, nontoxic-appearing 76 year old male here for evaluation of respiratory symptoms as outlined in HPI above.  His physical  exam reveals pearly-gray tympanic membranes bilaterally with normal light reflex and clear external auditory canals.  Nasal mucosa is erythematous edematous.  Thick purulent discharge in both nares.  He is sinuses are nontender to percussion.  Oropharyngeal exam reveals posterior oropharyngeal erythema with yellowish postnasal drip.  No exudate appreciated in the anterior oropharynx.  No cervical  adenopathy appreciable exam.  Cardiopulmonary exam reveals S1-S2 heart sounds with regular rate and rhythm and lung sounds are clear to auscultation and all fields.  Patient's exam is consistent with an upper respiratory infection.  Given the purulent discharge in his continue to run an elevated temp after week I am concerned it is bacterial in origin.  I will treat him with a 10-day course of Augmentin twice daily, after nasal spray, Tessalon Perles, and Promethazine DM cough syrup.   Final Clinical Impressions(s) / UC Diagnoses   Final diagnoses:  Acute upper respiratory infection  Acute cough     Discharge Instructions      The Augmentin twice daily with food for 10 days for treatment of your URI.  Perform sinus irrigation 2-3 times a day with a [employer] sinus rinse kit and distilled water.  Do not use tap water.  You can use plain over-the-counter Mucinex every 6 hours to break up the stickiness of the mucus so your body can clear it.  Increase your oral fluid intake to thin out your mucus so that is also able for your body to clear more easily.  Take an over-the-counter probiotic, such as Culturelle-align-activia, 1 hour after each dose of antibiotic to prevent diarrhea.  Use the Atrovent nasal spray, 2 squirts in each nostril every 6 hours, as needed for runny nose and postnasal drip.  Use the Tessalon Perles every 8 hours during the day.  Take them with a small sip of water.  They may give you some numbness to the base of your tongue or a metallic taste in your mouth, this is normal.  Use the Promethazine DM cough syrup at bedtime for cough and congestion.  It will make you drowsy so do not take it during the day.  If you develop any new or worsening symptoms return for reevaluation or see your primary care provider.      ED Prescriptions     Medication Sig Dispense Auth. Provider   amoxicillin-clavulanate (AUGMENTIN) 875-125 MG tablet Take 1 tablet by mouth every 12  (twelve) hours for 10 days. 14 tablet Margarette Canada, NP   ipratropium (ATROVENT) 0.06 % nasal spray Place 2 sprays into both nostrils 4 (four) times daily. 15 mL Margarette Canada, NP   benzonatate (TESSALON) 100 MG capsule Take 2 capsules (200 mg total) by mouth 3 (three) times daily as needed for cough. 21 capsule Margarette Canada, NP   promethazine-dextromethorphan (PROMETHAZINE-DM) 6.25-15 MG/5ML syrup Take 5 mLs by mouth 4 (four) times daily as needed. 118 mL Margarette Canada, NP      PDMP not reviewed this encounter.   Margarette Canada, NP 11/14/21 1246

## 2021-12-01 ENCOUNTER — Encounter: Payer: Self-pay | Admitting: Podiatry

## 2021-12-01 ENCOUNTER — Ambulatory Visit: Payer: PPO | Admitting: Podiatry

## 2021-12-01 DIAGNOSIS — M19072 Primary osteoarthritis, left ankle and foot: Secondary | ICD-10-CM | POA: Diagnosis not present

## 2021-12-01 DIAGNOSIS — M19071 Primary osteoarthritis, right ankle and foot: Secondary | ICD-10-CM

## 2021-12-01 MED ORDER — MELOXICAM 15 MG PO TABS: 15.0000 mg | ORAL_TABLET | Freq: Every day | ORAL | 3 refills | Status: AC

## 2021-12-01 NOTE — Progress Notes (Signed)
He presents today concerned that he may have an ingrown toenail to the hallux right he states that it was tender red and swollen for a few weeks and he says is doing much better now he is also concerned about arthritis to his forefoot and midfoot states that he takes 7 and half milligrams of meloxicam which does seem to help with his foot but is primarily for his back.  Objective: Vital signs stable he is alert orient x 3 pulses are palpable.  Hallux right demonstrates mild erythema there is no purulence no malodor there is no tenderness on palpation appears to be healing very nicely.  This was along the tibial border.  No signs of infection at this point.  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  He also has osteoarthritis of the tarsometatarsal joints on palpation.  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  Also does demonstrate a noninfected tibial border of the hallux right.  Plan: Follow-up with me if the hallux worsens  Otherwise we will increase his meloxicam from 7.5 mg to 15.0 mg.

## 2021-12-02 DIAGNOSIS — B351 Tinea unguium: Secondary | ICD-10-CM | POA: Diagnosis not present

## 2021-12-02 DIAGNOSIS — M2011 Hallux valgus (acquired), right foot: Secondary | ICD-10-CM | POA: Diagnosis not present

## 2021-12-02 DIAGNOSIS — M2012 Hallux valgus (acquired), left foot: Secondary | ICD-10-CM | POA: Diagnosis not present

## 2021-12-02 DIAGNOSIS — L600 Ingrowing nail: Secondary | ICD-10-CM | POA: Diagnosis not present

## 2022-01-10 DIAGNOSIS — D2272 Melanocytic nevi of left lower limb, including hip: Secondary | ICD-10-CM | POA: Diagnosis not present

## 2022-01-10 DIAGNOSIS — L570 Actinic keratosis: Secondary | ICD-10-CM | POA: Diagnosis not present

## 2022-01-10 DIAGNOSIS — Z85820 Personal history of malignant melanoma of skin: Secondary | ICD-10-CM | POA: Diagnosis not present

## 2022-01-10 DIAGNOSIS — L538 Other specified erythematous conditions: Secondary | ICD-10-CM | POA: Diagnosis not present

## 2022-01-10 DIAGNOSIS — D2261 Melanocytic nevi of right upper limb, including shoulder: Secondary | ICD-10-CM | POA: Diagnosis not present

## 2022-01-10 DIAGNOSIS — Z85828 Personal history of other malignant neoplasm of skin: Secondary | ICD-10-CM | POA: Diagnosis not present

## 2022-01-10 DIAGNOSIS — D485 Neoplasm of uncertain behavior of skin: Secondary | ICD-10-CM | POA: Diagnosis not present

## 2022-01-10 DIAGNOSIS — L820 Inflamed seborrheic keratosis: Secondary | ICD-10-CM | POA: Diagnosis not present

## 2022-01-10 DIAGNOSIS — R208 Other disturbances of skin sensation: Secondary | ICD-10-CM | POA: Diagnosis not present

## 2022-01-10 DIAGNOSIS — D044 Carcinoma in situ of skin of scalp and neck: Secondary | ICD-10-CM | POA: Diagnosis not present

## 2022-01-19 ENCOUNTER — Telehealth: Payer: Self-pay | Admitting: Internal Medicine

## 2022-01-19 NOTE — Telephone Encounter (Signed)
Patient stated he will call back when he gets home from [facility]

## 2022-01-21 ENCOUNTER — Telehealth: Payer: Self-pay | Admitting: Internal Medicine

## 2022-01-21 DIAGNOSIS — E7800 Pure hypercholesterolemia, unspecified: Secondary | ICD-10-CM

## 2022-01-21 DIAGNOSIS — N400 Enlarged prostate without lower urinary tract symptoms: Secondary | ICD-10-CM

## 2022-01-21 NOTE — Telephone Encounter (Signed)
Patient has a lab appt 01/26/2022, there are no orders in.

## 2022-01-23 NOTE — Telephone Encounter (Signed)
Orders placed for f/u labs.  

## 2022-01-23 NOTE — Addendum Note (Signed)
Addended by: Alisa Graff on: 01/23/2022 02:37 PM   Modules accepted: Orders

## 2022-01-26 ENCOUNTER — Other Ambulatory Visit (INDEPENDENT_AMBULATORY_CARE_PROVIDER_SITE_OTHER): Payer: PPO

## 2022-01-26 DIAGNOSIS — E7800 Pure hypercholesterolemia, unspecified: Secondary | ICD-10-CM | POA: Diagnosis not present

## 2022-01-26 DIAGNOSIS — N400 Enlarged prostate without lower urinary tract symptoms: Secondary | ICD-10-CM

## 2022-01-26 LAB — CBC WITH DIFFERENTIAL/PLATELET
Basophils Absolute: 0.1 10*3/uL (ref 0.0–0.1)
Basophils Relative: 1.1 % (ref 0.0–3.0)
Eosinophils Absolute: 0.2 10*3/uL (ref 0.0–0.7)
Eosinophils Relative: 3.2 % (ref 0.0–5.0)
HCT: 40.8 % (ref 39.0–52.0)
Hemoglobin: 13.5 g/dL (ref 13.0–17.0)
Lymphocytes Relative: 32.1 % (ref 12.0–46.0)
Lymphs Abs: 1.8 10*3/uL (ref 0.7–4.0)
MCHC: 33.2 g/dL (ref 30.0–36.0)
MCV: 93.1 fl (ref 78.0–100.0)
Monocytes Absolute: 0.4 10*3/uL (ref 0.1–1.0)
Monocytes Relative: 7.3 % (ref 3.0–12.0)
Neutro Abs: 3.2 10*3/uL (ref 1.4–7.7)
Neutrophils Relative %: 56.3 % (ref 43.0–77.0)
Platelets: 182 10*3/uL (ref 150.0–400.0)
RBC: 4.38 Mil/uL (ref 4.22–5.81)
RDW: 13.6 % (ref 11.5–15.5)
WBC: 5.7 10*3/uL (ref 4.0–10.5)

## 2022-01-26 LAB — HEPATIC FUNCTION PANEL
ALT: 16 U/L (ref 0–53)
AST: 19 U/L (ref 0–37)
Albumin: 4.5 g/dL (ref 3.5–5.2)
Alkaline Phosphatase: 50 U/L (ref 39–117)
Bilirubin, Direct: 0.1 mg/dL (ref 0.0–0.3)
Total Bilirubin: 0.6 mg/dL (ref 0.2–1.2)
Total Protein: 6.4 g/dL (ref 6.0–8.3)

## 2022-01-26 LAB — BASIC METABOLIC PANEL
BUN: 24 mg/dL — ABNORMAL HIGH (ref 6–23)
CO2: 31 mEq/L (ref 19–32)
Calcium: 9.3 mg/dL (ref 8.4–10.5)
Chloride: 105 mEq/L (ref 96–112)
Creatinine, Ser: 1.01 mg/dL (ref 0.40–1.50)
GFR: 72.44 mL/min (ref >60.00)
Glucose, Bld: 88 mg/dL (ref 70–99)
Potassium: 4 mEq/L (ref 3.5–5.1)
Sodium: 142 mEq/L (ref 135–145)

## 2022-01-26 LAB — LIPID PANEL
Cholesterol: 139 mg/dL (ref 0–200)
HDL: 45.1 mg/dL (ref >39.00)
LDL Cholesterol: 79 mg/dL (ref 0–99)
NonHDL: 93.7
Total CHOL/HDL Ratio: 3
Triglycerides: 74 mg/dL (ref 0.0–149.0)
VLDL: 14.8 mg/dL (ref 0.0–40.0)

## 2022-01-26 LAB — TSH: TSH: 1.64 u[IU]/mL (ref 0.35–5.50)

## 2022-01-26 LAB — PSA, MEDICARE: PSA: 1.2 ng/ml (ref 0.10–4.00)

## 2022-01-28 ENCOUNTER — Ambulatory Visit: Payer: PPO | Admitting: Internal Medicine

## 2022-01-28 ENCOUNTER — Other Ambulatory Visit: Payer: PPO

## 2022-02-03 ENCOUNTER — Ambulatory Visit (INDEPENDENT_AMBULATORY_CARE_PROVIDER_SITE_OTHER): Payer: PPO | Admitting: Internal Medicine

## 2022-02-03 ENCOUNTER — Ambulatory Visit (INDEPENDENT_AMBULATORY_CARE_PROVIDER_SITE_OTHER): Payer: PPO

## 2022-02-03 VITALS — BP 121/75 | HR 62 | Temp 97.8°F | Resp 16 | Ht 69.0 in | Wt 171.0 lb

## 2022-02-03 DIAGNOSIS — Z0000 Encounter for general adult medical examination without abnormal findings: Secondary | ICD-10-CM

## 2022-02-03 DIAGNOSIS — E7800 Pure hypercholesterolemia, unspecified: Secondary | ICD-10-CM | POA: Diagnosis not present

## 2022-02-03 DIAGNOSIS — I723 Aneurysm of iliac artery: Secondary | ICD-10-CM

## 2022-02-03 DIAGNOSIS — I714 Abdominal aortic aneurysm, without rupture, unspecified: Secondary | ICD-10-CM

## 2022-02-03 DIAGNOSIS — M79604 Pain in right leg: Secondary | ICD-10-CM | POA: Diagnosis not present

## 2022-02-03 NOTE — Progress Notes (Signed)
Patient ID: KIVON APREA, male   DOB: [birth year], 76 y.o.   MRN: [id number]   Subjective:    Patient ID: CLEVESTER HELZER, male    DOB: 05/09/1945, 76 y.o.   MRN: [id number]   Patient here for  Chief Complaint  Patient presents with   Follow-up    6 month follow up   .   HPI Here to follow up regarding his cholesterol.  Also followed by [facility] for AAA and iliac artery aneurysm. Recommended f/u in 24 months - last seen 06/2021.  Tries to stay active.  No chest pain or sob with increased activity or exertion.  Has noticed some increased pain - right lateral leg down to caof.  Has seen Dr Kayleen Memos - [facility].  Is s/p injection previously and meloxicam.  Discussed stretches.  Plans to f/u with Dr Kayleen Memos.  No abdominal pain.  Bowels stable.    Past Medical History:  Diagnosis Date   Arthritis    Diverticulosis, sigmoid    Environmental allergies    Frequency of urination    H/O sleep apnea    S/P  OSA surgery -- resolved   Hemorrhoids    History of BPH    History of melanoma excision    back   History of nephritis    age 38   History of squamous cell carcinoma excision    right arm, leg, face   History of urinary retention    secondary bph with obstruction   Melanoma ([place])    Urethral stricture    Past Surgical History:  Procedure Laterality Date   COLONOSCOPY WITH PROPOFOL N/A 06/13/2017   Procedure: COLONOSCOPY WITH PROPOFOL;  Surgeon: Lollie Sails, MD;  Location: [facility] ENDOSCOPY;  Service: Endoscopy;  Laterality: N/A;   CYSTOSCOPY WITH RETROGRADE URETHROGRAM N/A 05/08/2014   Procedure: CYSTOSCOPY WITH RETROGRADE URETHROGRAM;  Surgeon: Alexis Frock, MD;  Location: [facility];  Service: Urology;  Laterality: N/A;   CYSTOSCOPY WITH URETHRAL DILATATION N/A 05/08/2014   Procedure: CYSTOSCOPY WITH URETHRAL DILATATION;  Surgeon: Alexis Frock, MD;  Location: [facility];  Service: Urology;  Laterality: N/A;   KNEE ARTHROSCOPY W/ MENISCECTOMY Right x2    last one 2005   LAPAROSCOPIC INGUINAL HERNIA REPAIR Right 2007 (approx)   ORIF  RIGHT WRIST FX  2008   retained hardware   ROBOT ASSISTED LAPAROSCOPIC RADICAL PROSTATECTOMY N/A 03/19/2014   Procedure: ROBOTIC ASSISTED LAPAROSCOPIC SIMPLE PROSTATECTOMY;  Surgeon: Alexis Frock, MD;  Location: [facility];  Service: Urology;  Laterality: N/A;   TONSILLECTOMY  age 76   UVULOPALATOPHARYNGOPLASTY  x5  last one 7   Family History  Problem Relation Age of Onset   Heart disease Father        CABG x 4   Arthritis Father    Glaucoma Father    Hepatitis C Mother    Thyroid disease Sister    Social History   Socioeconomic History   Marital status: Married    Spouse name: Not on file   Number of children: Not on file   Years of education: Not on file   Highest education level: Not on file  Occupational History   Not on file  Tobacco Use   Smoking status: Never   Smokeless tobacco: Never  Vaping Use   Vaping Use: Never used  Substance and Sexual Activity   Alcohol use: Yes    Alcohol/week: 0.0 standard drinks of alcohol    Comment: rare   Drug use:  No   Sexual activity: Not on file  Other Topics Concern   Not on file  Social History Narrative   Not on file   Social Determinants of Health   Financial Resource Strain: Low Risk  (02/03/2022)   Overall Financial Resource Strain (CARDIA)    Difficulty of Paying Living Expenses: Not hard at all  Food Insecurity: No Food Insecurity (02/03/2022)   Hunger Vital Sign    Worried About Running Out of Food in the Last Year: Never true    Ran Out of Food in the Last Year: Never true  Transportation Needs: No Transportation Needs (02/03/2022)   PRAPARE - Hydrologist (Medical): No    Lack of Transportation (Non-Medical): No  Physical Activity: Sufficiently Active (02/03/2022)   Exercise Vital Sign    Days of Exercise per Week: 3 days    Minutes of Exercise per Session: 60 min  Stress: No Stress Concern  Present (02/03/2022)   [place]    Feeling of Stress : Not at all  Social Connections: Unknown (02/03/2022)   Social Connection and Isolation Panel [NHANES]    Frequency of Communication with Friends and Family: Not on file    Frequency of Social Gatherings with Friends and Family: More than three times a week    Attends Religious Services: Not on file    Active Member of Clubs or Organizations: Not on file    Attends Archivist Meetings: Not on file    Marital Status: Married     Review of Systems  Constitutional:  Negative for appetite change and unexpected weight change.  HENT:  Negative for congestion and sinus pressure.   Respiratory:  Negative for cough, chest tightness and shortness of breath.   Cardiovascular:  Negative for chest pain, palpitations and leg swelling.  Gastrointestinal:  Negative for abdominal pain, nausea and vomiting.  Genitourinary:  Negative for difficulty urinating and dysuria.  Musculoskeletal:  Negative for joint swelling and myalgias.       Right lateral leg pain as outlined.   Skin:  Negative for color change and rash.  Neurological:  Negative for dizziness, light-headedness and headaches.  Psychiatric/Behavioral:  Negative for agitation and dysphoric mood.        Objective:     BP 126/78   Pulse 62   Temp 97.8 F (36.6 C)   Resp 16   Ht '5\' 9"'$  (1.753 m)   Wt 171 lb (77.6 kg)   BMI 25.25 kg/m  Wt Readings from Last 3 Encounters:  02/07/22 171 lb (77.6 kg)  02/03/22 171 lb (77.6 kg)  07/27/21 170 lb 6.4 oz (77.3 kg)    Physical Exam Constitutional:      General: He is not in acute distress.    Appearance: Normal appearance. He is well-developed.  HENT:     Head: Normocephalic and atraumatic.     Right Ear: External ear normal.     Left Ear: External ear normal.  Eyes:     General: No scleral icterus.       Right eye: No discharge.        Left eye: No  discharge.  Cardiovascular:     Rate and Rhythm: Normal rate and regular rhythm.  Pulmonary:     Effort: Pulmonary effort is normal. No respiratory distress.     Breath sounds: Normal breath sounds.  Abdominal:     General: Bowel sounds are normal.  Palpations: Abdomen is soft.     Tenderness: There is no abdominal tenderness.  Musculoskeletal:        General: No swelling or tenderness.     Cervical back: Neck supple. No tenderness.  Lymphadenopathy:     Cervical: No cervical adenopathy.  Skin:    Findings: No erythema or rash.  Neurological:     Mental Status: He is alert.  Psychiatric:        Mood and Affect: Mood normal.        Behavior: Behavior normal.      Outpatient Encounter Medications as of 02/03/2022  Medication Sig   Acetylcarnitine HCl (ACETYL L-CARNITINE PO) Take 300 mg by mouth.   Alpha-Lipoic Acid 300 MG TABS Take by mouth.   Boswellia-Glucosamine-Vit D (OSTEO BI-FLEX ONE PER DAY PO) Take by mouth.   Coenzyme Q10 (COQ10 PO) Take by mouth.   DOCUSATE SODIUM PO Take by mouth.   fexofenadine-pseudoephedrine (ALLEGRA-D 24) 180-240 MG 24 hr tablet Take 1 tablet by mouth daily.   finasteride (PROSCAR) 5 MG tablet Take 5 mg by mouth daily.   fluticasone (FLONASE) 50 MCG/ACT nasal spray SMARTSIG:1-2 Spray(s) Both Nares Daily   ipratropium (ATROVENT) 0.06 % nasal spray Place 2 sprays into both nostrils 4 (four) times daily.   levocetirizine (XYZAL) 5 MG tablet Take 5 mg by mouth at bedtime.   meloxicam (MOBIC) 15 MG tablet Take 1 tablet (15 mg total) by mouth daily.   montelukast (SINGULAIR) 10 MG tablet Take 10 mg by mouth at bedtime.   Multiple Vitamins-Minerals (CENTRUM SILVER ADULT 50+ PO) Take 1 tablet by mouth daily.   Omega-3 Fatty Acids (FISH OIL PO) Take by mouth.   oxybutynin (DITROPAN) 5 MG tablet TK 1 T PO Q 8 H PRF URINARY FREQUENCY OR URGENCY   promethazine-dextromethorphan (PROMETHAZINE-DM) 6.25-15 MG/5ML syrup Take 5 mLs by mouth 4 (four) times  daily as needed.   rosuvastatin (CRESTOR) 20 MG tablet TAKE 1 TABLET BY MOUTH ONCE DAILY.   triamcinolone cream (KENALOG) 0.1 % Apply topically 2 (two) times daily.   [DISCONTINUED] benzonatate (TESSALON) 100 MG capsule Take 2 capsules (200 mg total) by mouth 3 (three) times daily as needed for cough.   No facility-administered encounter medications on file as of 02/03/2022.     Lab Results  Component Value Date   WBC 5.7 01/26/2022   HGB 13.5 01/26/2022   HCT 40.8 01/26/2022   PLT 182.0 01/26/2022   GLUCOSE 88 01/26/2022   CHOL 139 01/26/2022   TRIG 74.0 01/26/2022   HDL 45.10 01/26/2022   LDLCALC 79 01/26/2022   ALT 16 01/26/2022   AST 19 01/26/2022   NA 142 01/26/2022   K 4.0 01/26/2022   CL 105 01/26/2022   CREATININE 1.01 01/26/2022   BUN 24 (H) 01/26/2022   CO2 31 01/26/2022   TSH 1.64 01/26/2022   PSA 1.20 01/26/2022       Assessment & Plan:   Problem List Items Addressed This Visit     AAA (abdominal aortic aneurysm) without rupture ([place])    Evaluated 06/29/21 - Previous duplex ultrasound from 06/28/2021 showed: AAA 2.58 cm and stable internal iliac artery aneurysms measuring 1.3 cm in the right and 1.2 cm on the left.  Triphasic blood flow throughout the aortoiliac system.  Lower extremity duplex shows Rt pop 0.90 cm and left pop 0.94 cm aneurysms. Recommended f/u ultrasound in 24 months.       Hypercholesterolemia    On crestor.  Low cholesterol  diet and exercise.  Follow lipid panel and liver function tests.        Iliac artery aneurysm ([place])    Evaluated by vascular 06/2021.  Recommended f/u in 24 months.       Right leg pain    Right lateral leg pain - as outlined.  Has seen [facility] - Dr Kayleen Memos.  Stretches. Plans to f/u with Dr Kayleen Memos.         Einar Pheasant, MD

## 2022-02-03 NOTE — Progress Notes (Signed)
Subjective:   Kyle Meadows is a 76 y.o. male who presents for Medicare Annual/Subsequent preventive examination.  Review of Systems    No ROS.  Medicare Wellness Cardiac Risk Factors include: advanced age (>24mn, >>25women);male gender     Objective:    Today's Vitals   02/03/22 1015  BP: 121/75  Pulse: 62  Resp: 16  Temp: 97.8 F (36.6 C)  SpO2: 99%  Weight: 171 lb (77.6 kg)  Height: '5\' 9"'$  (1.753 m)   Body mass index is 25.25 kg/m.     02/03/2022   10:31 AM 01/11/2021    8:29 AM 09/07/2020   12:46 PM 01/08/2019    8:48 AM 01/01/2018    9:02 AM 07/03/2017    7:18 PM 06/13/2017   10:01 AM  Advanced Directives  Does Patient Have a Medical Advance Directive? Yes Yes No Yes Yes Yes Yes  Type of AParamedicof [place]Living will [place]West [place]Living will  [place]Living will [place] will Living will Living will  Does patient want to make changes to medical advance directive? No - Patient declined No - Patient declined  No - Patient declined No - Patient declined    Copy of [place] Chart? Yes - validated most recent copy scanned in chart (See row information) Yes - validated most recent copy scanned in chart (See row information)  Yes - validated most recent copy scanned in chart (See row information) Yes      Current Medications (verified) Outpatient Encounter Medications as of 02/03/2022  Medication Sig   Acetylcarnitine HCl (ACETYL L-CARNITINE PO) Take 300 mg by mouth.   Alpha-Lipoic Acid 300 MG TABS Take by mouth.   Boswellia-Glucosamine-Vit D (OSTEO BI-FLEX ONE PER DAY PO) Take by mouth.   Coenzyme Q10 (COQ10 PO) Take by mouth.   DOCUSATE SODIUM PO Take by mouth.   fexofenadine-pseudoephedrine (ALLEGRA-D 24) 180-240 MG 24 hr tablet Take 1 tablet by mouth daily.   finasteride (PROSCAR) 5 MG tablet Take 5 mg by mouth daily.   fluticasone (FLONASE) 50 MCG/ACT  nasal spray SMARTSIG:1-2 Spray(s) Both Nares Daily   ipratropium (ATROVENT) 0.06 % nasal spray Place 2 sprays into both nostrils 4 (four) times daily.   levocetirizine (XYZAL) 5 MG tablet Take 5 mg by mouth at bedtime.   meloxicam (MOBIC) 15 MG tablet Take 1 tablet (15 mg total) by mouth daily.   montelukast (SINGULAIR) 10 MG tablet Take 10 mg by mouth at bedtime.   Multiple Vitamins-Minerals (CENTRUM SILVER ADULT 50+ PO) Take 1 tablet by mouth daily.   Omega-3 Fatty Acids (FISH OIL PO) Take by mouth.   oxybutynin (DITROPAN) 5 MG tablet TK 1 T PO Q 8 H PRF URINARY FREQUENCY OR URGENCY   promethazine-dextromethorphan (PROMETHAZINE-DM) 6.25-15 MG/5ML syrup Take 5 mLs by mouth 4 (four) times daily as needed.   rosuvastatin (CRESTOR) 20 MG tablet TAKE 1 TABLET BY MOUTH ONCE DAILY.   triamcinolone cream (KENALOG) 0.1 % Apply topically 2 (two) times daily.   No facility-administered encounter medications on file as of 02/03/2022.    Allergies (verified) Hydrocodone and Percocet [oxycodone-acetaminophen]   History: Past Medical History:  Diagnosis Date   Arthritis    Diverticulosis, sigmoid    Environmental allergies    Frequency of urination    H/O sleep apnea    S/P  OSA surgery -- resolved   Hemorrhoids    History of BPH  History of melanoma excision    back   History of nephritis    age 42   History of squamous cell carcinoma excision    right arm, leg, face   History of urinary retention    secondary bph with obstruction   Melanoma ([place])    Urethral stricture    Past Surgical History:  Procedure Laterality Date   COLONOSCOPY WITH PROPOFOL N/A 06/13/2017   Procedure: COLONOSCOPY WITH PROPOFOL;  Surgeon: Lollie Sails, MD;  [facility] ENDOSCOPY;  Service: Endoscopy;  Laterality: N/A;   CYSTOSCOPY WITH RETROGRADE URETHROGRAM N/A 05/08/2014   Procedure: CYSTOSCOPY WITH RETROGRADE URETHROGRAM;  Surgeon: Alexis Frock, MD;  Location: [facility];  Service:  Urology;  Laterality: N/A;   CYSTOSCOPY WITH URETHRAL DILATATION N/A 05/08/2014   Procedure: CYSTOSCOPY WITH URETHRAL DILATATION;  Surgeon: Alexis Frock, MD;  Location: [facility];  Service: Urology;  Laterality: N/A;   KNEE ARTHROSCOPY W/ MENISCECTOMY Right x2   last one 2005   LAPAROSCOPIC INGUINAL HERNIA REPAIR Right 2007 (approx)   ORIF  RIGHT WRIST FX  2008   retained hardware   ROBOT ASSISTED LAPAROSCOPIC RADICAL PROSTATECTOMY N/A 03/19/2014   Procedure: ROBOTIC ASSISTED LAPAROSCOPIC SIMPLE PROSTATECTOMY;  Surgeon: Alexis Frock, MD;  Location: [facility] ORS;  Service: Urology;  Laterality: N/A;   TONSILLECTOMY  age 89   UVULOPALATOPHARYNGOPLASTY  x5  last one 70   Family History  Problem Relation Age of Onset   Heart disease Father        CABG x 4   Arthritis Father    Glaucoma Father    Hepatitis C Mother    Thyroid disease Sister    Social History   Socioeconomic History   Marital status: Married    Spouse name: Not on file   Number of children: Not on file   Years of education: Not on file   Highest education level: Not on file  Occupational History   Not on file  Tobacco Use   Smoking status: Never   Smokeless tobacco: Never  Vaping Use   Vaping Use: Never used  Substance and Sexual Activity   Alcohol use: Yes    Alcohol/week: 0.0 standard drinks of alcohol    Comment: rare   Drug use: No   Sexual activity: Not on file  Other Topics Concern   Not on file  Social History Narrative   Not on file   Social Determinants of Health   Financial Resource Strain: Low Risk  (02/03/2022)   Overall Financial Resource Strain (CARDIA)    Difficulty of Paying Living Expenses: Not hard at all  Food Insecurity: No Food Insecurity (02/03/2022)   Hunger Vital Sign    Worried About Running Out of Food in the Last Year: Never true    Ran Out of Food in the Last Year: Never true  Transportation Needs: No Transportation Needs (02/03/2022)   PRAPARE -  Hydrologist (Medical): No    Lack of Transportation (Non-Medical): No  Physical Activity: Sufficiently Active (02/03/2022)   Exercise Vital Sign    Days of Exercise per Week: 3 days    Minutes of Exercise per Session: 60 min  Stress: No Stress Concern Present (02/03/2022)   [place]    Feeling of Stress : Not at all  Social Connections: Unknown (02/03/2022)   Social Connection and Isolation Panel [NHANES]    Frequency of Communication with Friends  and Family: Not on file    Frequency of Social Gatherings with Friends and Family: More than three times a week    Attends Religious Services: Not on file    Active Member of Clubs or Organizations: Not on file    Attends Archivist Meetings: Not on file    Marital Status: Married    Tobacco Counseling Counseling given: Not Answered   Clinical Intake:  Pre-visit preparation completed: Yes        Diabetes: No  How often do you need to have someone help you when you read instructions, pamphlets, or other written materials from your doctor or pharmacy?: 1 - Never    Interpreter Needed?: No      Activities of Daily Living    02/03/2022   10:24 AM  In your present state of health, do you have any difficulty performing the following activities:  Hearing? 0  Vision? 0  Difficulty concentrating or making decisions? 0  Walking or climbing stairs? 0  Dressing or bathing? 0  Doing errands, shopping? 0  Preparing Food and eating ? N  Using the Toilet? N  In the past six months, have you accidently leaked urine? N  Comment Followed by Urology and PCP  Do you have problems with loss of bowel control? N  Managing your Medications? N  Managing your Finances? N  Housekeeping or managing your Housekeeping? N    Patient Care Team: Einar Pheasant, MD as PCP - General (Internal Medicine)  Indicate any recent Medical  Services you may have received from other than [facility] providers in the past year (date may be approximate).     Assessment:   This is a routine wellness examination for Kyle Meadows.  Hearing/Vision screen Hearing Screening - Comments:: Hearing aids Vision Screening - Comments:: Followed by [facility] Wears corrective lenses when reading Cataract extracted, bilateral; [facility] They have seen their ophthalmologist in the last 12 months.   Dietary issues and exercise activities discussed: Current Exercise Habits: Home exercise routine, Type of exercise: walking;calisthenics;strength training/weights (Riding road bike), Time (Minutes): 60, Frequency (Times/Week): 3, Weekly Exercise (Minutes/Week): 180, Intensity: Mild Healthy diet Good water intake    Goals Addressed               This Visit's Progress     Patient Stated     Weight (lb) < 165 lb (74.8 kg) (pt-stated)        Weight goal 160-165lb. Stay active/fit. Healthy diet.       Depression Screen    02/03/2022   10:28 AM 01/11/2021    8:28 AM 01/09/2020    8:53 AM 05/20/2019    1:07 PM 01/08/2019    8:53 AM 11/13/2018    1:54 PM 01/01/2018    9:03 AM  PHQ 2/9 Scores  PHQ - 2 Score 0 0 0 0 0 0 0  PHQ- 9 Score     0 0     Fall Risk    02/03/2022   10:28 AM 01/11/2021    8:30 AM 01/09/2020    8:47 AM 05/20/2019    1:07 PM 01/08/2019    8:53 AM  Fall Risk   Falls in the past year? 0 0 0 0 0  Number falls in past yr: 0 0 0    Injury with Fall? 0 0     Risk for fall due to : No Fall Risks      Follow up Falls evaluation completed Falls  evaluation completed Falls evaluation completed      FALL RISK PREVENTION PERTAINING TO THE HOME: Home free of loose throw rugs in walkways, pet beds, electrical cords, etc? Yes  Adequate lighting in your home to reduce risk of falls? Yes   ASSISTIVE DEVICES UTILIZED TO PREVENT FALLS: Life alert? No  Use of a cane, walker or w/c? No  Grab bars in the bathroom? No  Shower  chair or bench in shower? No  Elevated toilet seat or a handicapped toilet? No   TIMED UP AND GO: Was the test performed? Yes .  Length of time to ambulate 10 feet: 10 sec.   Gait steady and fast without use of assistive device  Cognitive Function:    01/01/2018    9:18 AM 12/27/2016    1:23 PM  MMSE - Mini Mental State Exam  Orientation to time 5 5  Orientation to Place 5 5  Registration 3 3  Attention/ Calculation 5 5  Recall 3 3  Language- name 2 objects 2 2  Language- repeat 1 1  Language- follow 3 step command 3 3  Language- read & follow direction 1 1  Write a sentence 1 1  Copy design 1 1  Total score 30 30        02/03/2022   10:42 AM 01/08/2019    8:56 AM  6CIT Screen  What Year? 0 points 0 points  What month? 0 points 0 points  What time? 0 points 0 points  Count back from 20 0 points 0 points  Months in reverse 0 points 0 points  Repeat phrase 0 points 0 points  Total Score 0 points 0 points    Immunizations Immunization History  Administered Date(s) Administered   Fluad Quad(high Dose 65+) 01/25/2019   Influenza Whole 01/28/2009, 02/09/2010, 01/24/2016   Influenza, High Dose Seasonal PF 12/27/2016, 01/01/2018, 01/20/2020, 01/21/2021   Influenza,inj,Quad PF,6+ Mos 01/28/2014   Influenza-Unspecified 01/21/2021   PFIZER(Purple Top)SARS-COV-2 Vaccination 05/25/2019, 06/15/2019, 01/31/2020, 12/22/2020   Pneumococcal Conjugate-13 06/09/2016   Pneumococcal Polysaccharide-23 04/16/2012   Tdap 01/01/2004, 02/02/2016, 01/24/2020   Zoster Recombinat (Shingrix) 07/13/2021, 10/13/2021   Flu Vaccine status: Due, Education has been provided regarding the importance of this vaccine. Advised may receive this vaccine at local pharmacy or Health Dept. Aware to provide a copy of the vaccination record if obtained from local pharmacy or Health Dept. Verbalized acceptance and understanding. Notes completed with [facility]. Agrees to update immunization record.   Covid-19  vaccine status: Completed vaccines x4.  Screening Tests Health Maintenance  Topic Date Due   COVID-19 Vaccine (5 - Pfizer risk series) 02/19/2022 (Originally 02/16/2021)   INFLUENZA VACCINE  07/24/2022 (Originally 11/23/2021)   TETANUS/TDAP  01/23/2030   Pneumonia Vaccine 78+ Years old  Completed   Hepatitis C Screening  Completed   Zoster Vaccines- Shingrix  Completed   HPV VACCINES  Aged Out   COLONOSCOPY (Pts 45-38yr Insurance coverage will need to be confirmed)  Discontinued    Health Maintenance There are no preventive care reminders to display for this patient.  Lung Cancer Screening: (Low Dose CT Chest recommended if Age 76-80years, 30 pack-year currently smoking OR have quit w/in 15years.) does not qualify.   Vision Screening: Recommended annual ophthalmology exams for early detection of glaucoma and other disorders of the eye.  Dental Screening: Recommended annual dental exams for proper oral hygiene  Community Resource Referral / Chronic Care Management: CRR required this visit?  No   CCM required this  visit?  No      Plan:     I have personally reviewed and noted the following in the patient's chart:   Medical and social history Use of alcohol, tobacco or illicit drugs  Current medications and supplements including opioid prescriptions. Patient is not currently taking opioid prescriptions. Functional ability and status Nutritional status Physical activity Advanced directives List of other physicians Hospitalizations, surgeries, and ER visits in previous 12 months Vitals Screenings to include cognitive, depression, and falls Referrals and appointments  In addition, I have reviewed and discussed with patient certain preventive protocols, quality metrics, and best practice recommendations. A written personalized care plan for preventive services as well as general preventive health recommendations were provided to patient.     Varney Biles,  LPN   [id number]

## 2022-02-03 NOTE — Patient Instructions (Addendum)
Kyle Meadows , Thank you for taking time to come for your Medicare Wellness Visit. I appreciate your ongoing commitment to your health goals. Please review the following plan we discussed and let me know if I can assist you in the future.   These are the goals we discussed:  Goals       Patient Stated     Weight (lb) < 165 lb (74.8 kg) (pt-stated)      Weight goal 160-165lb. Stay active/fit. Healthy diet.        This is a list of the screening recommended for you and due dates:  Health Maintenance  Topic Date Due   COVID-19 Vaccine (5 - Pfizer risk series) 02/19/2022*   Flu Shot  07/24/2022*   Tetanus Vaccine  01/23/2030   Pneumonia Vaccine  Completed   Hepatitis C Screening: USPSTF Recommendation to screen - Ages 18-79 yo.  Completed   Zoster (Shingles) Vaccine  Completed   HPV Vaccine  Aged Out   Colon Cancer Screening  Discontinued  *Topic was postponed. The date shown is not the original due date.    Advanced directives: on file  Next appointment: Follow up in one year for your annual wellness visit.   Preventive Care 30 Years and Older, Male  Preventive care refers to lifestyle choices and visits with your health care provider that can promote health and wellness. What does preventive care include? A yearly physical exam. This is also called an annual well check. Dental exams once or twice a year. Routine eye exams. Ask your health care provider how often you should have your eyes checked. Personal lifestyle choices, including: Daily care of your teeth and gums. Regular physical activity. Eating a healthy diet. Avoiding tobacco and drug use. Limiting alcohol use. Practicing safe sex. Taking low doses of aspirin every day. Taking vitamin and mineral supplements as recommended by your health care provider. What happens during an annual well check? The services and screenings done by your health care provider during your annual well check will depend on your age,  overall health, lifestyle risk factors, and family history of disease. Counseling  Your health care provider may ask you questions about your: Alcohol use. Tobacco use. Drug use. Emotional well-being. Home and relationship well-being. Sexual activity. Eating habits. History of falls. Memory and ability to understand (cognition). Work and work Statistician. Screening  You may have the following tests or measurements: Height, weight, and BMI. Blood pressure. Lipid and cholesterol levels. These may be checked every 5 years, or more frequently if you are over 28 years old. Skin check. Lung cancer screening. You may have this screening every year starting at age 48 if you have a 30-pack-year history of smoking and currently smoke or have quit within the past 15 years. Fecal occult blood test (FOBT) of the stool. You may have this test every year starting at age 34. Flexible sigmoidoscopy or colonoscopy. You may have a sigmoidoscopy every 5 years or a colonoscopy every 10 years starting at age 80. Prostate cancer screening. Recommendations will vary depending on your family history and other risks. Hepatitis C blood test. Hepatitis B blood test. Sexually transmitted disease (STD) testing. Diabetes screening. This is done by checking your blood sugar (glucose) after you have not eaten for a while (fasting). You may have this done every 1-3 years. Abdominal aortic aneurysm (AAA) screening. You may need this if you are a current or former smoker. Osteoporosis. You may be screened starting at age 20 if  you are at high risk. Talk with your health care provider about your test results, treatment options, and if necessary, the need for more tests. Vaccines  Your health care provider may recommend certain vaccines, such as: Influenza vaccine. This is recommended every year. Tetanus, diphtheria, and acellular pertussis (Tdap, Td) vaccine. You may need a Td booster every 10 years. Zoster vaccine.  You may need this after age 22. Pneumococcal 13-valent conjugate (PCV13) vaccine. One dose is recommended after age 23. Pneumococcal polysaccharide (PPSV23) vaccine. One dose is recommended after age 30. Talk to your health care provider about which screenings and vaccines you need and how often you need them. This information is not intended to replace advice given to you by your health care provider. Make sure you discuss any questions you have with your health care provider. Document Released: 05/08/2015 Document Revised: 12/30/2015 Document Reviewed: 02/10/2015 Elsevier Interactive Patient Education  2017 [place] Prevention in the Home Falls can cause injuries. They can happen to people of all ages. There are many things you can do to make your home safe and to help prevent falls. What can I do on the outside of my home? Regularly fix the edges of walkways and driveways and fix any cracks. Remove anything that might make you trip as you walk through a door, such as a raised step or threshold. Trim any bushes or trees on the path to your home. Use bright outdoor lighting. Clear any walking paths of anything that might make someone trip, such as rocks or tools. Regularly check to see if handrails are loose or broken. Make sure that both sides of any steps have handrails. Any raised decks and porches should have guardrails on the edges. Have any leaves, snow, or ice cleared regularly. Use sand or salt on walking paths during winter. Clean up any spills in your garage right away. This includes oil or grease spills. What can I do in the bathroom? Use night lights. Install grab bars by the toilet and in the tub and shower. Do not use towel bars as grab bars. Use non-skid mats or decals in the tub or shower. If you need to sit down in the shower, use a plastic, non-slip stool. Keep the floor dry. Clean up any water that spills on the floor as soon as it happens. Remove soap  buildup in the tub or shower regularly. Attach bath mats securely with double-sided non-slip rug tape. Do not have throw rugs and other things on the floor that can make you trip. What can I do in the bedroom? Use night lights. Make sure that you have a light by your bed that is easy to reach. Do not use any sheets or blankets that are too big for your bed. They should not hang down onto the floor. Have a firm chair that has side arms. You can use this for support while you get dressed. Do not have throw rugs and other things on the floor that can make you trip. What can I do in the kitchen? Clean up any spills right away. Avoid walking on wet floors. Keep items that you use a lot in easy-to-reach places. If you need to reach something above you, use a strong step stool that has a grab bar. Keep electrical cords out of the way. Do not use floor polish or wax that makes floors slippery. If you must use wax, use non-skid floor wax. Do not have throw rugs and other things on  the floor that can make you trip. What can I do with my stairs? Do not leave any items on the stairs. Make sure that there are handrails on both sides of the stairs and use them. Fix handrails that are broken or loose. Make sure that handrails are as long as the stairways. Check any carpeting to make sure that it is firmly attached to the stairs. Fix any carpet that is loose or worn. Avoid having throw rugs at the top or bottom of the stairs. If you do have throw rugs, attach them to the floor with carpet tape. Make sure that you have a light switch at the top of the stairs and the bottom of the stairs. If you do not have them, ask someone to add them for you. What else can I do to help prevent falls? Wear shoes that: Do not have high heels. Have rubber bottoms. Are comfortable and fit you well. Are closed at the toe. Do not wear sandals. If you use a stepladder: Make sure that it is fully opened. Do not climb a closed  stepladder. Make sure that both sides of the stepladder are locked into place. Ask someone to hold it for you, if possible. Clearly mark and make sure that you can see: Any grab bars or handrails. First and last steps. Where the edge of each step is. Use tools that help you move around (mobility aids) if they are needed. These include: Canes. Walkers. Scooters. Crutches. Turn on the lights when you go into a dark area. Replace any light bulbs as soon as they burn out. Set up your furniture so you have a clear path. Avoid moving your furniture around. If any of your floors are uneven, fix them. If there are any pets around you, be aware of where they are. Review your medicines with your doctor. Some medicines can make you feel dizzy. This can increase your chance of falling. Ask your doctor what other things that you can do to help prevent falls. This information is not intended to replace advice given to you by your health care provider. Make sure you discuss any questions you have with your health care provider. Document Released: 02/05/2009 Document Revised: 09/17/2015 Document Reviewed: 05/16/2014 Elsevier Interactive Patient Education  2017 [employer].

## 2022-02-07 ENCOUNTER — Encounter: Payer: Self-pay | Admitting: Internal Medicine

## 2022-02-07 DIAGNOSIS — M79604 Pain in right leg: Secondary | ICD-10-CM | POA: Insufficient documentation

## 2022-02-07 NOTE — Assessment & Plan Note (Signed)
Right lateral leg pain - as outlined.  Has seen [facility] - Dr Kayleen Memos.  Stretches. Plans to f/u with Dr Kayleen Memos.

## 2022-02-07 NOTE — Assessment & Plan Note (Signed)
Evaluated by vascular 06/2021.  Recommended f/u in 24 months.  

## 2022-02-07 NOTE — Assessment & Plan Note (Signed)
On crestor.  Low cholesterol diet and exercise.  Follow lipid panel and liver function tests.   

## 2022-02-07 NOTE — Assessment & Plan Note (Signed)
Evaluated 06/29/21 - Previous duplex ultrasound from 06/28/2021 showed: AAA 2.58 cm and stable internal iliac artery aneurysms measuring 1.3 cm in the right and 1.2 cm on the left.  Triphasic blood flow throughout the aortoiliac system.  Lower extremity duplex shows Rt pop 0.90 cm and left pop 0.94 cm aneurysms. Recommended f/u ultrasound in 24 months.  

## 2022-02-08 ENCOUNTER — Telehealth: Payer: Self-pay | Admitting: Internal Medicine

## 2022-02-08 NOTE — Telephone Encounter (Signed)
Pt came into the office to drop off immunization record. Place in provider folder

## 2022-02-09 NOTE — Telephone Encounter (Signed)
Already documented and sent to scan

## 2022-02-21 ENCOUNTER — Encounter (INDEPENDENT_AMBULATORY_CARE_PROVIDER_SITE_OTHER): Payer: Self-pay

## 2022-03-04 DIAGNOSIS — M5416 Radiculopathy, lumbar region: Secondary | ICD-10-CM | POA: Diagnosis not present

## 2022-03-08 ENCOUNTER — Encounter: Payer: Self-pay | Admitting: Internal Medicine

## 2022-03-08 DIAGNOSIS — M5416 Radiculopathy, lumbar region: Secondary | ICD-10-CM | POA: Insufficient documentation

## 2022-03-11 DIAGNOSIS — M5416 Radiculopathy, lumbar region: Secondary | ICD-10-CM | POA: Diagnosis not present

## 2022-03-23 DIAGNOSIS — D045 Carcinoma in situ of skin of trunk: Secondary | ICD-10-CM | POA: Diagnosis not present

## 2022-03-23 DIAGNOSIS — D044 Carcinoma in situ of skin of scalp and neck: Secondary | ICD-10-CM | POA: Diagnosis not present

## 2022-03-24 ENCOUNTER — Ambulatory Visit
Admission: EM | Admit: 2022-03-24 | Discharge: 2022-03-24 | Disposition: A | Discharge status: Home / Self Care | Payer: PPO | Attending: Urgent Care | Admitting: Urgent Care

## 2022-03-24 DIAGNOSIS — Z9889 Other specified postprocedural states: Secondary | ICD-10-CM | POA: Insufficient documentation

## 2022-03-24 DIAGNOSIS — Z1152 Encounter for screening for COVID-19: Secondary | ICD-10-CM | POA: Insufficient documentation

## 2022-03-24 DIAGNOSIS — J069 Acute upper respiratory infection, unspecified: Secondary | ICD-10-CM | POA: Diagnosis not present

## 2022-03-24 LAB — RESP PANEL BY RT-PCR (RSV, FLU A&B, COVID)  RVPGX2
Influenza A by PCR: NEGATIVE
Influenza B by PCR: NEGATIVE
Resp Syncytial Virus by PCR: NEGATIVE
SARS Coronavirus 2 by RT PCR: NEGATIVE

## 2022-03-24 NOTE — ED Triage Notes (Signed)
Pt. Presents to UC w/ c/o a productive cough, sore throat, nasal drainage and nasal congestion.

## 2022-03-24 NOTE — ED Provider Notes (Signed)
[facility]    CSN: [id number] HPI  Presents to urgent care with complaining of productive cough, so Arrival date & time: 03/24/22  4098      History   Chief Complaint No chief complaint on file.   HPI Kyle Meadows is a 76 y.o. male.   HPI  Presents to urgent care with complaining of productive cough, sore throat, nasal drainage, nasal congestion.  X 2 days.  He endorses copious green-colored nasal drainage as well as yellow-green sputum when he coughs.  Denies fever, chills, myalgias.  Denies nausea, vomiting, diarrhea.  Denies any significant comorbidities complicating his symptoms.  He has been using "Tylenol cough and cold".  He also sees an allergist and regularly uses Singulair, Flonase, ipratropium nasal spray.  Past Medical History:  Diagnosis Date   Arthritis    Diverticulosis, sigmoid    Environmental allergies    Frequency of urination    H/O sleep apnea    S/P  OSA surgery -- resolved   Hemorrhoids    History of BPH    History of melanoma excision    back   History of nephritis    age 15   History of squamous cell carcinoma excision    right arm, leg, face   History of urinary retention    secondary bph with obstruction   Melanoma ([place])    Urethral stricture     Patient Active Problem List   Diagnosis Date Noted   Lumbar radiculopathy 03/08/2022   Right leg pain 02/07/2022   Vasomotor rhinitis 09/04/2021   History of squamous cell carcinoma of skin 06/22/2020   Sleep apnea 06/22/2020   Knee pain 06/22/2020   AAA (abdominal aortic aneurysm) without rupture ([place]) 06/22/2020   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   Asthma, chronic 06/22/2020   Low back pain 05/27/2020   Dizziness 11/17/2018   Cervical radiculopathy 10/01/2018   Patellar tendonitis 10/01/2018   Osteoarthritis of knee 07/27/2017   Abdominal pain 12/30/2016   Iliac artery aneurysm ([place]) 11/22/2016   Hypercholesterolemia 12/08/2015   Health care maintenance  07/13/2014   DJD (degenerative joint disease) 10/09/2012   Enlarged prostate 10/09/2012   Other nonmedicinal substance allergy status 10/09/2012   Malignant neoplasm of skin 10/09/2012   Hemorrhoids 10/09/2012   Benign enlargement of prostate 10/09/2012    Past Surgical History:  Procedure Laterality Date   COLONOSCOPY WITH PROPOFOL N/A 06/13/2017   Procedure: COLONOSCOPY WITH PROPOFOL;  Surgeon: Lollie Sails, MD;  Location: [facility] ENDOSCOPY;  Service: Endoscopy;  Laterality: N/A;   CYSTOSCOPY WITH RETROGRADE URETHROGRAM N/A 05/08/2014   Procedure: CYSTOSCOPY WITH RETROGRADE URETHROGRAM;  Surgeon: Alexis Frock, MD;  Location: [facility];  Service: Urology;  Laterality: N/A;   CYSTOSCOPY WITH URETHRAL DILATATION N/A 05/08/2014   Procedure: CYSTOSCOPY WITH URETHRAL DILATATION;  Surgeon: Alexis Frock, MD;  Location: [facility];  Service: Urology;  Laterality: N/A;   KNEE ARTHROSCOPY W/ MENISCECTOMY Right x2   last one 2005   LAPAROSCOPIC INGUINAL HERNIA REPAIR Right 2007 (approx)   ORIF  RIGHT WRIST FX  2008   retained hardware   ROBOT ASSISTED LAPAROSCOPIC RADICAL PROSTATECTOMY N/A 03/19/2014   Procedure: ROBOTIC ASSISTED LAPAROSCOPIC SIMPLE PROSTATECTOMY;  Surgeon: Alexis Frock, MD;  Location: [facility];  Service: Urology;  Laterality: N/A;   TONSILLECTOMY  age 24   UVULOPALATOPHARYNGOPLASTY  x5  last one 1995       Home Medications    Prior to Admission medications   Medication  Sig Start Date End Date Taking? Authorizing Provider  Acetylcarnitine HCl (ACETYL L-CARNITINE PO) Take 300 mg by mouth.    [provider]  Alpha-Lipoic Acid 300 MG TABS Take by mouth.    [provider]  Boswellia-Glucosamine-Vit D (OSTEO BI-FLEX ONE PER DAY PO) Take by mouth.    [provider]  Coenzyme Q10 (COQ10 PO) Take by mouth.    [provider]  DOCUSATE SODIUM PO Take by mouth.    [provider]   fexofenadine-pseudoephedrine (ALLEGRA-D 24) 180-240 MG 24 hr tablet Take 1 tablet by mouth daily.    [provider]  finasteride (PROSCAR) 5 MG tablet Take 5 mg by mouth daily. 11/03/20   [provider]  fluticasone Asencion Islam) 50 MCG/ACT nasal spray SMARTSIG:1-2 Spray(s) Both Nares Daily 06/17/20   [provider]  ipratropium (ATROVENT) 0.06 % nasal spray Place 2 sprays into both nostrils 4 (four) times daily. 11/14/21   Margarette Canada, NP  levocetirizine (XYZAL) 5 MG tablet Take 5 mg by mouth at bedtime. 03/07/19   [provider]  meloxicam (MOBIC) 15 MG tablet Take 1 tablet (15 mg total) by mouth daily. 12/01/21   Hyatt, Max T, DPM  montelukast (SINGULAIR) 10 MG tablet Take 10 mg by mouth at bedtime. 03/30/19   [provider]  Multiple Vitamins-Minerals (CENTRUM SILVER ADULT 50+ PO) Take 1 tablet by mouth daily.    [provider]  Omega-3 Fatty Acids (FISH OIL PO) Take by mouth.    [provider]  oxybutynin (DITROPAN) 5 MG tablet TK 1 T PO Q 8 H PRF URINARY FREQUENCY OR URGENCY 03/31/15   [provider]  promethazine-dextromethorphan (PROMETHAZINE-DM) 6.25-15 MG/5ML syrup Take 5 mLs by mouth 4 (four) times daily as needed. 11/14/21   Margarette Canada, NP  rosuvastatin (CRESTOR) 20 MG tablet TAKE 1 TABLET BY MOUTH ONCE DAILY. 10/28/21   Einar Pheasant, MD  triamcinolone cream (KENALOG) 0.1 % Apply topically 2 (two) times daily. 12/23/19   [provider]    Family History Family History  Problem Relation Age of Onset   Heart disease Father        CABG x 4   Arthritis Father    Glaucoma Father    Hepatitis C Mother    Thyroid disease Sister     Social History Social History   Tobacco Use   Smoking status: Never   Smokeless tobacco: Never  Vaping Use   Vaping Use: Never used  Substance Use Topics   Alcohol use: Yes    Alcohol/week: 0.0 standard drinks of alcohol    Comment: rare   Drug use: No      Allergies   Hydrocodone and Percocet [oxycodone-acetaminophen]   Review of Systems Review of Systems   Physical Exam Triage Vital Signs ED Triage Vitals  Enc Vitals Group     BP      Pulse      Resp      Temp      Temp src      SpO2      Weight      Height      Head Circumference      Peak Flow      Pain Score      Pain Loc      Pain Edu?      Excl. in [place]?    No data found.  Updated Vital Signs There were no vitals taken for this visit.  Visual Acuity  Right Eye Distance:   Left Eye Distance:   Bilateral Distance:    Right Eye Near:   Left Eye Near:    Bilateral Near:     Physical Exam   UC Treatments / Results  Labs (all labs ordered are listed, but only abnormal results are displayed) Labs Reviewed - No data to display  EKG   Radiology No results found.  Procedures Procedures (including critical care time)  Medications Ordered in UC Medications - No data to display  Initial Impression / Assessment and Plan / UC Course  I have reviewed the triage vital signs and the nursing notes.  Pertinent labs & imaging results that were available during my care of the patient were reviewed by me and considered in my medical decision making (see chart for details).   Patient is afebrile here without recent antipyretics. Satting well on room air. Overall is well appearing, well hydrated, without respiratory distress. Pulmonary exam is unremarkable.  Lungs CTAB without wheezing or rhonchi.  Minor pharyngeal erythema is present without peritonsillar exudates.  Suspect viral pathogen as the cause of his upper respiratory symptoms.  Results of testing are pending.  Patient is advised to use OTC medication for symptom control and to follow-up with his primary care provider if symptoms do not resolve.  Final Clinical Impressions(s) / UC Diagnoses   Final diagnoses:  None   Discharge Instructions   None    ED Prescriptions   None    PDMP not  reviewed this encounter.   Rose Phi, [place] 03/24/22 [phone]

## 2022-03-24 NOTE — Discharge Instructions (Addendum)
The symptoms you reported today are most likely caused by a viral upper respiratory infection.  This is a self limiting illness and you should find your symptoms resolving within a few days.  We have performed a respiratory swab checking for COVID, influenza, and RSV.  If the results of this testing are positive, someone will call you if you are eligible for any antiviral treatment.  As we discussed, I recommend you continue to use the medication prescribed by your allergist.  You might also find relief of your symptoms by using a nasal decongestant such as Sudafed sinus (pseudoephedrine).  You will need to obtain this medication from behind the pharmacist counter.  Speak to the pharmacist to verify that you are not duplicating medications with other over-the-counter formulations that you may be using.  Use Tylenol for control of any fever, chills, body aches that may develop.  Follow up here or with your primary care provider if your symptoms are worsening or not improving.

## 2022-04-01 DIAGNOSIS — M5416 Radiculopathy, lumbar region: Secondary | ICD-10-CM | POA: Diagnosis not present

## 2022-04-22 ENCOUNTER — Ambulatory Visit
Admission: EM | Admit: 2022-04-22 | Discharge: 2022-04-22 | Disposition: A | Discharge status: Home / Self Care | Payer: PPO | Attending: Emergency Medicine | Admitting: Emergency Medicine

## 2022-04-22 DIAGNOSIS — J0100 Acute maxillary sinusitis, unspecified: Secondary | ICD-10-CM

## 2022-04-22 MED ORDER — AMOXICILLIN 875 MG PO TABS: 875.0000 mg | ORAL_TABLET | Freq: Two times a day (BID) | ORAL | 0 refills | Status: AC

## 2022-04-22 NOTE — Discharge Instructions (Addendum)
Take the amoxicillin as directed.  Follow up with your primary care provider if your symptoms are not improving.   ° ° °

## 2022-04-22 NOTE — ED Triage Notes (Addendum)
Patient to Urgent Care with complaints of nasal congestion/ drainage and dry/ intermittent cough that started at thanksgiving. Patient was seen 11/30 and had a negative respiratory panel.  Taking daily allergy pill/ nasal sprays prescribed by allergist.   Denies any known fevers. Negative home covid test today.

## 2022-04-22 NOTE — ED Provider Notes (Signed)
Kyle Meadows    CSN: [id number] Arrival date & time: 04/22/22  1250      History   Chief Complaint Chief Complaint  Patient presents with   Cough    Congested runny nose - Entered by patient    HPI TURHAN CHILL is a 76 y.o. male.  Patient presents with 5 week history of congestion, postnasal drip, cough.  No fever, shortness of breath, vomiting, diarrhea, or other symptoms.  Treatment at home with allergy medication and nasal spray.  He was seen at this urgent care on 03/24/2022; diagnosed with Viral URI with cough; negative respiratory panel; symptomatic treatment.   His medical history includes asthma, allergies, BPH, iliac artery aneurysm, AAA, popliteal aneurysm.   The history is provided by the patient and medical records.    Past Medical History:  Diagnosis Date   Arthritis    Diverticulosis, sigmoid    Environmental allergies    Frequency of urination    H/O sleep apnea    S/P  OSA surgery -- resolved   Hemorrhoids    History of BPH    History of melanoma excision    back   History of nephritis    age 34   History of squamous cell carcinoma excision    right arm, leg, face   History of urinary retention    secondary bph with obstruction   Melanoma ([place])    Urethral stricture     Patient Active Problem List   Diagnosis Date Noted   History of colonoscopy 03/24/2022   Lumbar radiculopathy 03/08/2022   Right leg pain 02/07/2022   Vasomotor rhinitis 09/04/2021   History of squamous cell carcinoma of skin 06/22/2020   Sleep apnea 06/22/2020   Knee pain 06/22/2020   AAA (abdominal aortic aneurysm) without rupture ([place]) 06/22/2020   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   Asthma, chronic 06/22/2020   Low back pain 05/27/2020   Dizziness 11/17/2018   Cervical radiculopathy 10/01/2018   Patellar tendonitis 10/01/2018   Osteoarthritis of knee 07/27/2017   Abdominal pain 12/30/2016   Iliac artery aneurysm ([place]) 11/22/2016   Hypercholesterolemia  12/08/2015   Health care maintenance 07/13/2014   DJD (degenerative joint disease) 10/09/2012   Enlarged prostate 10/09/2012   Other nonmedicinal substance allergy status 10/09/2012   Malignant neoplasm of skin 10/09/2012   Hemorrhoids 10/09/2012   Benign enlargement of prostate 10/09/2012    Past Surgical History:  Procedure Laterality Date   COLONOSCOPY WITH PROPOFOL N/A 06/13/2017   Procedure: COLONOSCOPY WITH PROPOFOL;  Surgeon: Lollie Sails, MD;  Location: [facility] ENDOSCOPY;  Service: Endoscopy;  Laterality: N/A;   CYSTOSCOPY WITH RETROGRADE URETHROGRAM N/A 05/08/2014   Procedure: CYSTOSCOPY WITH RETROGRADE URETHROGRAM;  Surgeon: Alexis Frock, MD;  Location[facility];  Service: Urology;  Laterality: N/A;   CYSTOSCOPY WITH URETHRAL DILATATION N/A 05/08/2014   Procedure: CYSTOSCOPY WITH URETHRAL DILATATION;  Surgeon: Alexis Frock, MD;  Location: [facility];  Service: Urology;  Laterality: N/A;   KNEE ARTHROSCOPY W/ MENISCECTOMY Right x2   last one 2005   LAPAROSCOPIC INGUINAL HERNIA REPAIR Right 2007 (approx)   ORIF  RIGHT WRIST FX  2008   retained hardware   ROBOT ASSISTED LAPAROSCOPIC RADICAL PROSTATECTOMY N/A 03/19/2014   Procedure: ROBOTIC ASSISTED LAPAROSCOPIC SIMPLE PROSTATECTOMY;  Surgeon: Alexis Frock, MD;  Location: [facility];  Service: Urology;  Laterality: N/A;   TONSILLECTOMY  age 54   UVULOPALATOPHARYNGOPLASTY  x5  last one 1995  Home Medications    Prior to Admission medications   Medication Sig Start Date End Date Taking? Authorizing Provider  amoxicillin (AMOXIL) 875 MG tablet Take 1 tablet (875 mg total) by mouth 2 (two) times daily for 7 days. 04/22/22 04/29/22 Yes Sharion Balloon, NP  Acetylcarnitine HCl (ACETYL L-CARNITINE PO) Take 300 mg by mouth.    [provider]  Alpha-Lipoic Acid 300 MG TABS Take by mouth.    [provider]  Boswellia-Glucosamine-Vit D (OSTEO BI-FLEX ONE PER DAY PO) Take by  mouth.    [provider]  Coenzyme Q10 (COQ10 PO) Take by mouth.    [provider]  diclofenac (VOLTAREN) 75 MG EC tablet Take 1 tablet every 12 hours by oral route.    [provider]  DOCUSATE SODIUM PO Take by mouth.    [provider]  fexofenadine-pseudoephedrine (ALLEGRA-D 24) 180-240 MG 24 hr tablet Take 1 tablet by mouth daily.    [provider]  finasteride (PROSCAR) 5 MG tablet Take 5 mg by mouth daily. 11/03/20   [provider]  fluticasone Asencion Islam) 50 MCG/ACT nasal spray SMARTSIG:1-2 Spray(s) Both Nares Daily 06/17/20   [provider]  ipratropium (ATROVENT) 0.06 % nasal spray Place 2 sprays into both nostrils 4 (four) times daily. 11/14/21   Margarette Canada, NP  levocetirizine (XYZAL) 5 MG tablet Take 5 mg by mouth at bedtime. 03/07/19   [provider]  meloxicam (MOBIC) 15 MG tablet Take 1 tablet (15 mg total) by mouth daily. 12/01/21   Hyatt, Max T, DPM  montelukast (SINGULAIR) 10 MG tablet Take 10 mg by mouth at bedtime. 03/30/19   [provider]  Multiple Vitamins-Minerals (CENTRUM SILVER ADULT 50+ PO) Take 1 tablet by mouth daily.    [provider]  Omega-3 Fatty Acids (FISH OIL PO) Take by mouth.    [provider]  oxybutynin (DITROPAN) 5 MG tablet TK 1 T PO Q 8 H PRF URINARY FREQUENCY OR URGENCY 03/31/15   [provider]  promethazine-dextromethorphan (PROMETHAZINE-DM) 6.25-15 MG/5ML syrup Take 5 mLs by mouth 4 (four) times daily as needed. 11/14/21   Margarette Canada, NP  rosuvastatin (CRESTOR) 20 MG tablet TAKE 1 TABLET BY MOUTH ONCE DAILY. 10/28/21   Einar Pheasant, MD  [facility] injection  09/27/21   [provider]  triamcinolone cream (KENALOG) 0.1 % Apply topically 2 (two) times daily. 12/23/19   [provider]    Family History Family History  Problem Relation Age of Onset   Heart disease Father        CABG x 4   Arthritis Father    Glaucoma  Father    Hepatitis C Mother    Thyroid disease Sister     Social History Social History   Tobacco Use   Smoking status: Never   Smokeless tobacco: Never  Vaping Use   Vaping Use: Never used  Substance Use Topics   Alcohol use: Yes    Alcohol/week: 0.0 standard drinks of alcohol    Comment: rare   Drug use: No     Allergies   Hydrocodone and Percocet [oxycodone-acetaminophen]   Review of Systems Review of Systems  Constitutional:  Negative for chills and fever.  HENT:  Positive for congestion and postnasal drip. Negative for ear pain and sore throat.   Respiratory:  Positive for cough. Negative for shortness of breath.   Cardiovascular:  Negative for chest pain and palpitations.  Gastrointestinal:  Negative for diarrhea and vomiting.  Skin:  Negative for color change and rash.  All other systems reviewed and are negative.    Physical Exam Triage Vital Signs ED Triage Vitals  Enc Vitals Group     BP 04/22/22 1445 135/75     Pulse Rate 04/22/22 1445 61     Resp 04/22/22 1445 14     Temp 04/22/22 1445 97.9 F (36.6 C)     Temp src --      SpO2 04/22/22 1445 97 %     Weight 04/22/22 1444 163 lb (73.9 kg)     Height 04/22/22 1444 '5\' 9"'$  (1.753 m)     Head Circumference --      Peak Flow --      Pain Score 04/22/22 1443 0     Pain Loc --      Pain Edu? --      Excl. in [place]? --    No data found.  Updated Vital Signs BP 135/75   Pulse 61   Temp 97.9 F (36.6 C)   Resp 14   Ht '5\' 9"'$  (1.753 m)   Wt 163 lb (73.9 kg)   SpO2 97%   BMI 24.07 kg/m   Visual Acuity Right Eye Distance:   Left Eye Distance:   Bilateral Distance:    Right Eye Near:   Left Eye Near:    Bilateral Near:     Physical Exam Vitals and nursing note reviewed.  Constitutional:      General: He is not in acute distress.    Appearance: Normal appearance. He is well-developed. He is not ill-appearing.  HENT:     Right Ear: Tympanic membrane normal.     Left Ear: Tympanic membrane  normal.     Nose: Congestion present.     Mouth/Throat:     Mouth: Mucous membranes are moist.     Pharynx: Oropharynx is clear.  Cardiovascular:     Rate and Rhythm: Normal rate and regular rhythm.     Heart sounds: Normal heart sounds.  Pulmonary:     Effort: Pulmonary effort is normal. No respiratory distress.     Breath sounds: Normal breath sounds.  Musculoskeletal:     Cervical back: Neck supple.  Skin:    General: Skin is warm and dry.  Neurological:     Mental Status: He is alert.  Psychiatric:        Mood and Affect: Mood normal.        Behavior: Behavior normal.      UC Treatments / Results  Labs (all labs ordered are listed, but only abnormal results are displayed) Labs Reviewed - No data to display  EKG   Radiology No results found.  Procedures Procedures (including critical care time)  Medications Ordered in UC Medications - No data to display  Initial Impression / Assessment and Plan / UC Course  I have reviewed the triage vital signs and the nursing notes.  Pertinent labs & imaging results that were available during my care of the patient were reviewed by me and considered in my medical decision making (see chart for details).    Acute sinusitis.  Patient has been symptomatic for 5 weeks.  Treating with amoxicillin.  Discussed symptomatic treatment including Tylenol.  Instructed patient to follow up with his PCP if his symptoms are not improving.  He agrees to plan of care.   Final Clinical Impressions(s) / UC Diagnoses   Final diagnoses:  Acute non-recurrent maxillary sinusitis  Discharge Instructions      Take the amoxicillin as directed.  Follow up with your primary care provider if your symptoms are not improving.        ED Prescriptions     Medication Sig Dispense Auth. Provider   amoxicillin (AMOXIL) 875 MG tablet Take 1 tablet (875 mg total) by mouth 2 (two) times daily for 7 days. 14 tablet Sharion Balloon, NP      PDMP  not reviewed this encounter.   Sharion Balloon, NP 04/22/22 [phone]

## 2022-04-26 DIAGNOSIS — N35919 Unspecified urethral stricture, male, unspecified site: Secondary | ICD-10-CM | POA: Diagnosis not present

## 2022-04-26 DIAGNOSIS — R3912 Poor urinary stream: Secondary | ICD-10-CM | POA: Diagnosis not present

## 2022-04-26 DIAGNOSIS — R310 Gross hematuria: Secondary | ICD-10-CM | POA: Diagnosis not present

## 2022-04-27 ENCOUNTER — Encounter: Payer: Self-pay | Admitting: Internal Medicine

## 2022-04-27 DIAGNOSIS — N35919 Unspecified urethral stricture, male, unspecified site: Secondary | ICD-10-CM | POA: Insufficient documentation

## 2022-04-27 DIAGNOSIS — R319 Hematuria, unspecified: Secondary | ICD-10-CM | POA: Insufficient documentation

## 2022-04-29 ENCOUNTER — Other Ambulatory Visit: Payer: Self-pay | Admitting: Internal Medicine

## 2022-05-03 ENCOUNTER — Other Ambulatory Visit: Payer: Self-pay | Admitting: Urology

## 2022-05-25 DIAGNOSIS — M170 Bilateral primary osteoarthritis of knee: Secondary | ICD-10-CM | POA: Diagnosis not present

## 2022-05-31 ENCOUNTER — Encounter (HOSPITAL_BASED_OUTPATIENT_CLINIC_OR_DEPARTMENT_OTHER): Payer: Self-pay | Admitting: Urology

## 2022-05-31 NOTE — Progress Notes (Signed)
Spoke w/ via phone for pre-op interview---Kyle Meadows needs dos---- ISTAT (GENT)              Meadows results------ COVID test -----patient states asymptomatic no test needed Arrive at -------1015 NPO after MN NO Solid Food.  Clear liquids from MN until---0915 Med rec completed Medications to take morning of surgery -----Ditropan and Proscar Diabetic medication ----- Patient instructed no nail polish to be worn day of surgery Patient instructed to bring photo id and insurance card day of surgery Patient aware to have Driver (ride ) / caregiver  Wife Kyle Meadows  for 24 hours after surgery  Patient Special Instructions ----- Pre-Op special Istructions ----- Patient verbalized understanding of instructions that were given at this phone interview. Patient denies shortness of breath, chest pain, fever, cough at this phone interview.

## 2022-06-01 DIAGNOSIS — M1711 Unilateral primary osteoarthritis, right knee: Secondary | ICD-10-CM | POA: Diagnosis not present

## 2022-06-08 DIAGNOSIS — M1711 Unilateral primary osteoarthritis, right knee: Secondary | ICD-10-CM | POA: Diagnosis not present

## 2022-06-09 ENCOUNTER — Telehealth: Payer: Self-pay | Admitting: Internal Medicine

## 2022-06-09 ENCOUNTER — Other Ambulatory Visit: Payer: Self-pay

## 2022-06-09 DIAGNOSIS — R413 Other amnesia: Secondary | ICD-10-CM

## 2022-06-09 DIAGNOSIS — R42 Dizziness and giddiness: Secondary | ICD-10-CM

## 2022-06-09 MED ORDER — STERILE WATER FOR INJECTION IJ SOLN
1.0000 mg | Freq: Once | INTRAVENOUS | Status: AC
  Administered 2022-06-10: 1.5 mL via INTRALESIONAL
  Filled 2022-06-09: qty 1

## 2022-06-09 NOTE — Telephone Encounter (Signed)
Ok to refer to [facility] Neurology.

## 2022-06-09 NOTE — Telephone Encounter (Signed)
Referral placed Pt advised

## 2022-06-09 NOTE — Addendum Note (Signed)
Addended by: Denita Lung A on: 06/09/2022 03:17 PM   Modules accepted: Orders

## 2022-06-09 NOTE — Telephone Encounter (Signed)
Patient would like Dr Nicki Reaper to put in a referral to Dr Tomi Likens in [place], Neurologist. Phone # [phone].Marland Kitchen

## 2022-06-09 NOTE — Telephone Encounter (Signed)
Patient requesting referral to neurology for memory changes. Says that he has noticed memory gradually getting worse. Stated that he was previously referred to Dr Manuella Ghazi but would prefer to go to [facility] so requesting new referral. Ok to send referral to neurology?

## 2022-06-10 ENCOUNTER — Ambulatory Visit (HOSPITAL_BASED_OUTPATIENT_CLINIC_OR_DEPARTMENT_OTHER): Payer: PPO | Admitting: Anesthesiology

## 2022-06-10 ENCOUNTER — Other Ambulatory Visit: Payer: Self-pay

## 2022-06-10 ENCOUNTER — Encounter (HOSPITAL_BASED_OUTPATIENT_CLINIC_OR_DEPARTMENT_OTHER): Payer: Self-pay | Admitting: Urology

## 2022-06-10 ENCOUNTER — Ambulatory Visit (HOSPITAL_BASED_OUTPATIENT_CLINIC_OR_DEPARTMENT_OTHER)
Admission: RE | Admit: 2022-06-10 | Discharge: 2022-06-10 | Disposition: A | Discharge status: Home / Self Care | Payer: PPO | Source: Ambulatory Visit | Attending: Urology | Admitting: Urology

## 2022-06-10 ENCOUNTER — Encounter (HOSPITAL_BASED_OUTPATIENT_CLINIC_OR_DEPARTMENT_OTHER)
Admission: RE | Disposition: A | Discharge status: Home / Self Care | Payer: Self-pay | Source: Ambulatory Visit | Attending: Urology

## 2022-06-10 DIAGNOSIS — J45909 Unspecified asthma, uncomplicated: Secondary | ICD-10-CM | POA: Insufficient documentation

## 2022-06-10 DIAGNOSIS — N401 Enlarged prostate with lower urinary tract symptoms: Secondary | ICD-10-CM | POA: Diagnosis not present

## 2022-06-10 DIAGNOSIS — N35919 Unspecified urethral stricture, male, unspecified site: Secondary | ICD-10-CM | POA: Diagnosis not present

## 2022-06-10 DIAGNOSIS — M1990 Unspecified osteoarthritis, unspecified site: Secondary | ICD-10-CM | POA: Insufficient documentation

## 2022-06-10 DIAGNOSIS — N99114 Postprocedural urethral stricture, male, unspecified: Secondary | ICD-10-CM | POA: Diagnosis not present

## 2022-06-10 DIAGNOSIS — R3912 Poor urinary stream: Secondary | ICD-10-CM | POA: Insufficient documentation

## 2022-06-10 DIAGNOSIS — G4730 Sleep apnea, unspecified: Secondary | ICD-10-CM | POA: Diagnosis not present

## 2022-06-10 DIAGNOSIS — N35914 Unspecified anterior urethral stricture, male: Secondary | ICD-10-CM | POA: Diagnosis not present

## 2022-06-10 DIAGNOSIS — Z01818 Encounter for other preprocedural examination: Secondary | ICD-10-CM

## 2022-06-10 HISTORY — DX: Sleep apnea, unspecified: G47.30

## 2022-06-10 HISTORY — PX: CYSTOSCOPY WITH INJECTION: SHX1424

## 2022-06-10 HISTORY — PX: CYSTOSCOPY WITH URETHRAL DILATATION: SHX5125

## 2022-06-10 LAB — POCT I-STAT, CHEM 8
BUN: 28 mg/dL — ABNORMAL HIGH (ref 8–23)
Calcium, Ion: 1.31 mmol/L (ref 1.15–1.40)
Chloride: 102 mmol/L (ref 98–111)
Creatinine, Ser: 1 mg/dL (ref 0.61–1.24)
Glucose, Bld: 91 mg/dL (ref 70–99)
HCT: 43 % (ref 39.0–52.0)
Hemoglobin: 14.6 g/dL (ref 13.0–17.0)
Potassium: 4.4 mmol/L (ref 3.5–5.1)
Sodium: 143 mmol/L (ref 135–145)
TCO2: 29 mmol/L (ref 22–32)

## 2022-06-10 SURGERY — CYSTOSCOPY, WITH URETHRAL DILATION
Anesthesia: General

## 2022-06-10 MED ORDER — EPHEDRINE 5 MG/ML INJ
INTRAVENOUS | Status: AC
  Filled 2022-06-10: qty 5

## 2022-06-10 MED ORDER — FENTANYL CITRATE (PF) 100 MCG/2ML IJ SOLN
INTRAMUSCULAR | Status: AC
  Filled 2022-06-10: qty 2

## 2022-06-10 MED ORDER — PROPOFOL 10 MG/ML IV BOLUS
INTRAVENOUS | Status: DC | PRN
  Administered 2022-06-10 (×2): 50 mg via INTRAVENOUS
  Administered 2022-06-10: 150 mg via INTRAVENOUS

## 2022-06-10 MED ORDER — LACTATED RINGERS IV SOLN: INTRAVENOUS | Status: DC

## 2022-06-10 MED ORDER — EPHEDRINE SULFATE (PRESSORS) 50 MG/ML IJ SOLN
INTRAMUSCULAR | Status: DC | PRN
  Administered 2022-06-10 (×3): 5 mg via INTRAVENOUS

## 2022-06-10 MED ORDER — PHENYLEPHRINE HCL (PRESSORS) 10 MG/ML IV SOLN
INTRAVENOUS | Status: DC | PRN
  Administered 2022-06-10: 160 ug via INTRAVENOUS

## 2022-06-10 MED ORDER — ONDANSETRON HCL 4 MG/2ML IJ SOLN
INTRAMUSCULAR | Status: AC
  Filled 2022-06-10: qty 6

## 2022-06-10 MED ORDER — SULFAMETHOXAZOLE-TRIMETHOPRIM 800-160 MG PO TABS: 1.0000 | ORAL_TABLET | Freq: Every day | ORAL | 0 refills | Status: AC

## 2022-06-10 MED ORDER — CEFAZOLIN SODIUM 1 G IJ SOLR
INTRAMUSCULAR | Status: AC
  Filled 2022-06-10: qty 20

## 2022-06-10 MED ORDER — DEXAMETHASONE SODIUM PHOSPHATE 4 MG/ML IJ SOLN
INTRAMUSCULAR | Status: DC | PRN
  Administered 2022-06-10: 4 mg via INTRAVENOUS

## 2022-06-10 MED ORDER — PROPOFOL 1000 MG/100ML IV EMUL
INTRAVENOUS | Status: AC
  Filled 2022-06-10: qty 100

## 2022-06-10 MED ORDER — PHENYLEPHRINE 80 MCG/ML (10ML) SYRINGE FOR IV PUSH (FOR BLOOD PRESSURE SUPPORT)
PREFILLED_SYRINGE | INTRAVENOUS | Status: AC
  Filled 2022-06-10: qty 20

## 2022-06-10 MED ORDER — DEXAMETHASONE SODIUM PHOSPHATE 10 MG/ML IJ SOLN
INTRAMUSCULAR | Status: AC
  Filled 2022-06-10: qty 1

## 2022-06-10 MED ORDER — TRAMADOL HCL 50 MG PO TABS: 50.0000 mg | ORAL_TABLET | Freq: Four times a day (QID) | ORAL | 0 refills | Status: DC | PRN

## 2022-06-10 MED ORDER — ACETAMINOPHEN 500 MG PO TABS
ORAL_TABLET | ORAL | Status: AC
  Filled 2022-06-10: qty 2

## 2022-06-10 MED ORDER — ONDANSETRON HCL 4 MG/2ML IJ SOLN
INTRAMUSCULAR | Status: DC | PRN
  Administered 2022-06-10: 4 mg via INTRAVENOUS

## 2022-06-10 MED ORDER — LIDOCAINE HCL (CARDIAC) PF 100 MG/5ML IV SOSY
PREFILLED_SYRINGE | INTRAVENOUS | Status: DC | PRN
  Administered 2022-06-10: 100 mg via INTRAVENOUS

## 2022-06-10 MED ORDER — FENTANYL CITRATE (PF) 100 MCG/2ML IJ SOLN
INTRAMUSCULAR | Status: DC | PRN
  Administered 2022-06-10 (×2): 25 ug via INTRAVENOUS

## 2022-06-10 MED ORDER — ACETAMINOPHEN 500 MG PO TABS
1000.0000 mg | ORAL_TABLET | Freq: Once | ORAL | Status: AC
  Administered 2022-06-10: 1000 mg via ORAL

## 2022-06-10 MED ORDER — GENTAMICIN SULFATE 40 MG/ML IJ SOLN
380.0000 mg | INTRAVENOUS | Status: AC
  Administered 2022-06-10: 380 mg via INTRAVENOUS
  Filled 2022-06-10: qty 9.5

## 2022-06-10 MED ORDER — FENTANYL CITRATE (PF) 100 MCG/2ML IJ SOLN: 25.0000 ug | INTRAMUSCULAR | Status: DC | PRN

## 2022-06-10 MED ORDER — LIDOCAINE HCL (PF) 2 % IJ SOLN
INTRAMUSCULAR | Status: AC
  Filled 2022-06-10: qty 15

## 2022-06-10 MED ORDER — PROPOFOL 10 MG/ML IV BOLUS
INTRAVENOUS | Status: AC
  Filled 2022-06-10: qty 20

## 2022-06-10 MED ORDER — SENNOSIDES-DOCUSATE SODIUM 8.6-50 MG PO TABS: 1.0000 | ORAL_TABLET | Freq: Two times a day (BID) | ORAL | 0 refills | Status: DC

## 2022-06-10 SURGICAL SUPPLY — 37 items
BAG DRAIN URO-CYSTO SKYTR STRL (DRAIN) ×1 IMPLANT
BAG DRN RND TRDRP ANRFLXCHMBR (UROLOGICAL SUPPLIES) ×1
BAG DRN UROCATH (DRAIN) ×1
BAG URINE DRAIN 2000ML AR STRL (UROLOGICAL SUPPLIES) IMPLANT
BAG URINE LEG 500ML (DRAIN) IMPLANT
BALLN NEPHROSTOMY (BALLOONS) ×1
BALLN OPTILUME DCB 30X3X75 (BALLOONS)
BALLN OPTILUME DCB 30X5X75 (BALLOONS)
BALLOON NEPHROSTOMY (BALLOONS) IMPLANT
BALLOON OPTILUME DCB 30X3X75 (BALLOONS) IMPLANT
BALLOON OPTILUME DCB 30X5X75 (BALLOONS) IMPLANT
CATH FOLEY 2W COUNCIL 20FR 5CC (CATHETERS) IMPLANT
CATH FOLEY 2W COUNCIL 5CC 16FR (CATHETERS) IMPLANT
CATH FOLEY 2W COUNCIL 5CC 18FR (CATHETERS) IMPLANT
CATH FOLEY 2WAY  3CC 10FR (CATHETERS)
CATH FOLEY 2WAY 3CC 10FR (CATHETERS) IMPLANT
CATH ROBINSON RED A/P 14FR (CATHETERS) IMPLANT
CLOTH BEACON ORANGE TIMEOUT ST (SAFETY) ×2 IMPLANT
ELECT REM PT RETURN 9FT ADLT (ELECTROSURGICAL)
ELECTRODE REM PT RTRN 9FT ADLT (ELECTROSURGICAL) IMPLANT
GLOVE BIO SURGEON STRL SZ7.5 (GLOVE) ×1 IMPLANT
GOWN STRL REUS W/TWL LRG LVL3 (GOWN DISPOSABLE) ×1 IMPLANT
GUIDEWIRE ANG ZIPWIRE 038X150 (WIRE) ×1 IMPLANT
GUIDEWIRE STR DUAL SENSOR (WIRE) IMPLANT
HOLDER FOLEY CATH W/STRAP (MISCELLANEOUS) IMPLANT
KIT TURNOVER CYSTO (KITS) ×1 IMPLANT
MANIFOLD NEPTUNE II (INSTRUMENTS) ×1 IMPLANT
NDL ASPIRATION 22 (NEEDLE) ×1 IMPLANT
NDL SAFETY ECLIP 18X1.5 (MISCELLANEOUS) IMPLANT
NDL SPNL 22GX3.5 QUINCKE BK (NEEDLE) IMPLANT
NEEDLE ASPIRATION 22 (NEEDLE) ×1 IMPLANT
NEEDLE SPNL 22GX3.5 QUINCKE BK (NEEDLE) ×1 IMPLANT
PACK CYSTO (CUSTOM PROCEDURE TRAY) ×1 IMPLANT
SLEEVE SCD COMPRESS KNEE MED (STOCKING) ×1 IMPLANT
SYR 20ML LL LF (SYRINGE) IMPLANT
TUBE CONNECTING 12X1/4 (SUCTIONS) IMPLANT
WATER STERILE IRR 3000ML UROMA (IV SOLUTION) ×1 IMPLANT

## 2022-06-10 NOTE — Anesthesia Preprocedure Evaluation (Addendum)
Anesthesia Evaluation  Patient identified by MRN, date of birth, ID band Patient awake    Reviewed: Allergy & Precautions, NPO status , Patient's Chart, lab work & pertinent test results  History of Anesthesia Complications Negative for: history of anesthetic complications  Airway Mallampati: II  TM Distance: >3 FB Neck ROM: Full    Dental no notable dental hx.    Pulmonary asthma , sleep apnea (s/p UPPP)    Pulmonary exam normal        Cardiovascular negative cardio ROS Normal cardiovascular exam     Neuro/Psych  negative psych ROS   GI/Hepatic negative GI ROS, Neg liver ROS,,,  Endo/Other  negative endocrine ROS    Renal/GU negative Renal ROS   URETHRAL STRICTURE    Musculoskeletal  (+) Arthritis ,    Abdominal   Peds  Hematology negative hematology ROS (+)   Anesthesia Other Findings Day of surgery medications reviewed with patient.  Reproductive/Obstetrics negative OB ROS                              Anesthesia Physical Anesthesia Plan  ASA: 2  Anesthesia Plan: General   Post-op Pain Management: Tylenol PO (pre-op)*   Induction: Intravenous  PONV Risk Score and Plan: 2 and Treatment may vary due to age or medical condition, Ondansetron and Dexamethasone  Airway Management Planned: LMA  Additional Equipment: None  Intra-op Plan:   Post-operative Plan: Extubation in OR  Informed Consent: I have reviewed the patients History and Physical, chart, labs and discussed the procedure including the risks, benefits and alternatives for the proposed anesthesia with the patient or authorized representative who has indicated his/her understanding and acceptance.     Dental advisory given  Plan Discussed with: CRNA  Anesthesia Plan Comments:         Anesthesia Quick Evaluation

## 2022-06-10 NOTE — Anesthesia Procedure Notes (Signed)
Procedure Name: LMA Insertion Date/Time: 06/10/2022 12:35 PM  Performed by: Georgeanne Nim, CRNAPre-anesthesia Checklist: Patient identified, Emergency Drugs available, Suction available, Patient being monitored and Timeout performed Patient Re-evaluated:Patient Re-evaluated prior to induction Oxygen Delivery Method: Circle system utilized Preoxygenation: Pre-oxygenation with 100% oxygen Induction Type: IV induction Ventilation: Mask ventilation without difficulty LMA: LMA inserted LMA Size: 4.0 Number of attempts: 1 Placement Confirmation: positive ETCO2 and breath sounds checked- equal and bilateral Tube secured with: Tape Dental Injury: Teeth and Oropharynx as per pre-operative assessment

## 2022-06-10 NOTE — Transfer of Care (Signed)
Immediate Anesthesia Transfer of Care Note  Patient: Kyle Meadows  Procedure(s) Performed: CYSTOSCOPY WITH URETHRAL DILATATION CYSTOSCOPY WITH INJECTION OF MITOMYCIN  Patient Location: PACU  Anesthesia Type:General  Level of Consciousness: drowsy and patient cooperative  Airway & Oxygen Therapy: Patient Spontanous Breathing and Patient connected to nasal cannula oxygen  Post-op Assessment: Report given to RN and Post -op Vital signs reviewed and stable  Post vital signs: Reviewed and stable  Last Vitals:  Vitals Value Taken Time  BP 126/73 06/10/22 1312  Temp    Pulse 70 06/10/22 1313  Resp    SpO2 98 % 06/10/22 1313  Vitals shown include unvalidated device data.  Last Pain:  Vitals:   06/10/22 1049  TempSrc: Oral  PainSc: 0-No pain         Complications: No notable events documented.

## 2022-06-10 NOTE — Discharge Instructions (Addendum)
1 - You may have urinary urgency (bladder spasms) and bloody urine on / off with catheter in place. This is normal.  2 - Call MD or go to ER for fever >102, severe pain / nausea / vomiting not relieved by medications, or acute change in medical status         No acetaminophen/Tylenol until after 5:00 pm today if needed.   Post Anesthesia Home Care Instructions  Activity: Get plenty of rest for the remainder of the day. A responsible individual must stay with you for 24 hours following the procedure.  For the next 24 hours, DO NOT: -Drive a car -Paediatric nurse -Drink alcoholic beverages -Take any medication unless instructed by your physician -Make any legal decisions or sign important papers.  Meals: Start with liquid foods such as gelatin or soup. Progress to regular foods as tolerated. Avoid greasy, spicy, heavy foods. If nausea and/or vomiting occur, drink only clear liquids until the nausea and/or vomiting subsides. Call your physician if vomiting continues.  Special Instructions/Symptoms: Your throat may feel dry or sore from the anesthesia or the breathing tube placed in your throat during surgery. If this causes discomfort, gargle with warm salt water. The discomfort should disappear within 24 hours.

## 2022-06-10 NOTE — Brief Op Note (Signed)
06/10/2022  1:00 PM  PATIENT:  Kyle Meadows  77 y.o. male  PRE-OPERATIVE DIAGNOSIS:  URETHRAL STRICTURE  POST-OPERATIVE DIAGNOSIS:  URETHRAL STRICTURE  PROCEDURE:  Procedure(s) with comments: CYSTOSCOPY WITH URETHRAL DILATATION (N/A) - 24 MINS CYSTOSCOPY WITH INJECTION OF MITOMYCIN (N/A)  SURGEON:  Surgeon(s) and Role:    * Alexis Frock, MD - Primary  PHYSICIAN ASSISTANT:   ASSISTANTS: none   ANESTHESIA:   general  EBL:  minimal   BLOOD ADMINISTERED:none  DRAINS:  44F council foley to gravity    LOCAL MEDICATIONS USED:  NONE  SPECIMEN:  No Specimen  DISPOSITION OF SPECIMEN:  N/A  COUNTS:  YES  TOURNIQUET:  * No tourniquets in log *  DICTATION: .Other Dictation: Dictation Number [id number]  PLAN OF CARE: Discharge to home after PACU  PATIENT DISPOSITION:  PACU - hemodynamically stable.   Delay start of Pharmacological VTE agent (>24hrs) due to surgical blood loss or risk of bleeding: not applicable

## 2022-06-10 NOTE — Anesthesia Postprocedure Evaluation (Signed)
Anesthesia Post Note  Patient: Kyle Meadows  Procedure(s) Performed: CYSTOSCOPY WITH URETHRAL DILATATION CYSTOSCOPY WITH INJECTION OF MITOMYCIN     Patient location during evaluation: PACU Anesthesia Type: General Level of consciousness: awake and alert Pain management: pain level controlled Vital Signs Assessment: post-procedure vital signs reviewed and stable Respiratory status: spontaneous breathing, nonlabored ventilation and respiratory function stable Cardiovascular status: blood pressure returned to baseline Postop Assessment: no apparent nausea or vomiting Anesthetic complications: no   No notable events documented.  Last Vitals:  Vitals:   06/10/22 1330 06/10/22 1345  BP: 129/77 130/79  Pulse: 62 63  Resp: 10 11  Temp:    SpO2: 98% 93%    Last Pain:  Vitals:   06/10/22 1345  TempSrc:   PainSc: 0-No pain                 Marthenia Rolling

## 2022-06-10 NOTE — H&P (Signed)
Kyle Meadows is an 77 y.o. male.    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 Pre-op TRUS 155gm. Passed trial of void 1 week post-op. Remains on finasteride + oxybutynin post-op to prevent regrowth and for some irritative symptoms.   2 - Urethral Stricture - pt with new splaying of urinary stream and weak stream 04/2014, treated with OR dilation 05/2014 for high-grade distal stricure (<1cm segment just proximal to fossa navicularis). Recurrent by cysto 1`/2024.   3 - Prostate Cancer Screening - Annual screening until 2016 - PSA 2.12 at age 68   4 - Gross Hematuria - new gross hematuria 09/2017 x 1 day, few clots, no retention. UCX negative. No recent GU imaging. BMp normal, Cr <1. This was after 2 weeks in [place] with lots of physical activity and then long plane ride with prolonged sitting. Non-recurrnet. Hematuria CT 10/2017 UNremarkable. Cysto 11/2017 with some partial recurrence prostate fossa tissue ===> Start finasteride.    PMH sig for lap right inguinal hernia repair (mesh) via umbilical approach. One son in [place] Occupational psychologist) another in [place] ([employer] football program) His PCP is Radio producer with [employer].    Today Kyle Meadows is seen to proceed with cysto, urethral dilation, mit-C injeciton for recurrent pendulous stricture. No interval fevers. Most recnet UA without infectious parameters.   Past Medical History:  Diagnosis Date   Arthritis    Diverticulosis, sigmoid    Environmental allergies    Frequency of urination    H/O sleep apnea    S/P  OSA surgery -- resolved   Hemorrhoids    History of BPH    History of melanoma excision    back   History of nephritis    age 52   History of squamous cell carcinoma excision    right arm, leg, face    History of urinary retention    secondary bph with obstruction   Melanoma ([place])    Sleep apnea    Urethral stricture     Past Surgical History:  Procedure Laterality Date   COLONOSCOPY WITH PROPOFOL N/A 06/13/2017   Procedure: COLONOSCOPY WITH PROPOFOL;  Surgeon: Lollie Sails, MD;  Location: [facility]COPY;  Service: Endoscopy;  Laterality: N/A;   CYSTOSCOPY WITH RETROGRADE URETHROGRAM N/A 05/08/2014   Procedure: CYSTOSCOPY WITH RETROGRADE URETHROGRAM;  Surgeon: Alexis Frock, MD;  Location: [facility];  Service: Urology;  Laterality: N/A;   CYSTOSCOPY WITH URETHRAL DILATATION N/A 05/08/2014   Procedure: CYSTOSCOPY WITH URETHRAL DILATATION;  Surgeon: Alexis Frock, MD;  Location: [facility];  Service: Urology;  Laterality: N/A;   KNEE ARTHROSCOPY W/ MENISCECTOMY Right x2   last one 2005   LAPAROSCOPIC INGUINAL HERNIA REPAIR Right 2007 (approx)   ORIF  RIGHT WRIST FX  2008   retained hardware   ROBOT ASSISTED LAPAROSCOPIC RADICAL PROSTATECTOMY N/A 03/19/2014   Procedure: ROBOTIC ASSISTED LAPAROSCOPIC SIMPLE PROSTATECTOMY;  Surgeon: Alexis Frock, MD;  Location: [facility];  Service: Urology;  Laterality: N/A;   TONSILLECTOMY  age 59   UVULOPALATOPHARYNGOPLASTY  x5  last one 42    Family History  Problem Relation Age of Onset   Heart disease Father        CABG x 4   Arthritis Father    Glaucoma Father    Hepatitis C Mother  Thyroid disease Sister    Social History:  reports that he has never smoked. He has never used smokeless tobacco. He reports current alcohol use. He reports that he does not use drugs.  Allergies:  Allergies  Allergen Reactions   Hydrocodone Other (See Comments)    hyper   Percocet [Oxycodone-Acetaminophen] Other (See Comments)    "keeps me wide awake"    No medications prior to admission.    No results found for this or any previous visit (from the past 48 hour(s)). No results found.  Review of Systems   Constitutional:  Negative for chills and fever.  All other systems reviewed and are negative.   Height 5' 9"$  (1.753 m), weight 74.4 kg. Physical Exam Vitals reviewed.  Eyes:     Pupils: Pupils are equal, round, and reactive to light.  Cardiovascular:     Rate and Rhythm: Normal rate.  Pulmonary:     Effort: Pulmonary effort is normal.  Abdominal:     General: Abdomen is flat.  Genitourinary:    Comments: No CVAT Musculoskeletal:     Cervical back: Normal range of motion.  Neurological:     General: No focal deficit present.     Mental Status: He is alert.  Psychiatric:        Mood and Affect: Mood normal.      Assessment/Plan  Proceed as planned with cysto, urethral dilation. Risks (including stricture recurrence), benefits, alternatives, expected peri-op course discussed previously and reiterated today.   Alexis Frock, MD 06/10/2022, 7:10 AM

## 2022-06-10 NOTE — Op Note (Unsigned)
NAME: Kyle Meadows, Kyle Meadows MEDICAL RECORD NO: [id number] ACCOUNT NO: [id number] DATE OF BIRTH: 01-Nov-1945 FACILITY: [facility] LOCATION: [facility] PHYSICIAN: Alexis Frock, MD  Operative Report   DATE OF PROCEDURE: 06/10/2022  PREOPERATIVE DIAGNOSIS:  Pendulous urethral stricture.  PROCEDURE:  Cystoscopy with urethral dilation and injection of intralesional mitomycin.  ESTIMATED BLOOD LOSS:  Nil.  COMPLICATIONS:  None.  SPECIMEN:  None.  FINDINGS: 1.  Short segment distal pendulous urethral stricture estimate 8 to 10-French pre-dilation and 24-French post-dilation. 2.  Prior simple prostatectomy, prostate defect.  DRAINS:  20-French Council catheter to gravity irrigation.  INDICATIONS:  This is a pleasant 77 year old man with a remote history of simple prostatectomy.  He has done very well for years from that, however he was found on workup of recurrent irritative voiding to have a new distal urethral stricture just within  the fossa navicularis of the penis.  He underwent dilation of this previously.  However, he did have a clinical recurrence.  Given the recurrent nature and short segment of the repeat dilation with injection of mitomycin to reduce the risk of recurrence  would be most prudent.  He presents for this today.  Informed consent was obtained and placed in medical record.  PROCEDURE DETAILS:  The patient being identified and verified.  The procedure being cystoscopy with intralesional mitomycin and urethral dilation was confirmed.  Procedure timeout was performed.  Intravenous antibiotics were administered.  General LMA  anesthesia was induced.  The patient was placed into a low lithotomy position.  Sterile field was created, prepped and draped the patient's penis, perineum, and proximal thighs using iodine.  Attempt was made at cystourethroscopy using a 24-French  injection scope approximately 3-4 mm within the fossa navicularis high-grade stricture was encountered.  This was  relatively short segment and was estimated to be approximately 8-French.  A 0.038 ZIPwire was navigated to the level of the urinary bladder  using fluoroscopic guidance over which a 24-French balloon dilation apparatus was advanced across the level of stricture.  This was inflated to a pressure of 20 atmospheres, held for 90 seconds and released.  This resulted in excellent opening of the  stricture.  Inspection of the urethra proximal to this only revealed wide open prostatic urethra consistent with prior simple prostatectomy.  Otherwise, unremarkable bladder.  Given the very distal location of the stricture and this was palpated to be  approximately 5 mm within the area of the fossa navicularis and was felt that a percutaneous by hand injection technique would be most advantageous.  As such, a 1% mitomycin solution was injected circumferentially intraurethrally with a small needle into  the area of scar approximately 1 mL of the 0.1% concentration was used.  Next, a 20-French Council catheter was placed over the working ZIPwire to the level of the bladder, 10 mL water in the balloon, connected to straight drain.  Procedure was terminated.   The patient tolerated procedure well, no immediate perioperative complications.  The patient taken to postanesthesia care unit in stable condition.  Plan for discharge home.  He has followup with a trial of void next week.   PUS D: 06/10/2022 1:05:40 pm T: 06/10/2022 2:19:00 pm  JOB: [id number]

## 2022-06-13 ENCOUNTER — Encounter (HOSPITAL_BASED_OUTPATIENT_CLINIC_OR_DEPARTMENT_OTHER): Payer: Self-pay | Admitting: Urology

## 2022-06-13 DIAGNOSIS — R310 Gross hematuria: Secondary | ICD-10-CM | POA: Diagnosis not present

## 2022-06-13 DIAGNOSIS — R3912 Poor urinary stream: Secondary | ICD-10-CM | POA: Diagnosis not present

## 2022-06-13 DIAGNOSIS — N35919 Unspecified urethral stricture, male, unspecified site: Secondary | ICD-10-CM | POA: Diagnosis not present

## 2022-06-15 DIAGNOSIS — M1711 Unilateral primary osteoarthritis, right knee: Secondary | ICD-10-CM | POA: Diagnosis not present

## 2022-06-16 ENCOUNTER — Encounter: Payer: Self-pay | Admitting: Internal Medicine

## 2022-06-16 ENCOUNTER — Ambulatory Visit (INDEPENDENT_AMBULATORY_CARE_PROVIDER_SITE_OTHER): Payer: PPO | Admitting: Internal Medicine

## 2022-06-16 VITALS — BP 122/70 | HR 69 | Temp 98.0°F | Resp 16 | Ht 69.0 in | Wt 170.0 lb

## 2022-06-16 DIAGNOSIS — I714 Abdominal aortic aneurysm, without rupture, unspecified: Secondary | ICD-10-CM

## 2022-06-16 DIAGNOSIS — I723 Aneurysm of iliac artery: Secondary | ICD-10-CM | POA: Diagnosis not present

## 2022-06-16 DIAGNOSIS — E7800 Pure hypercholesterolemia, unspecified: Secondary | ICD-10-CM | POA: Diagnosis not present

## 2022-06-16 DIAGNOSIS — R413 Other amnesia: Secondary | ICD-10-CM | POA: Diagnosis not present

## 2022-06-16 DIAGNOSIS — R319 Hematuria, unspecified: Secondary | ICD-10-CM

## 2022-06-16 DIAGNOSIS — R1031 Right lower quadrant pain: Secondary | ICD-10-CM | POA: Diagnosis not present

## 2022-06-16 DIAGNOSIS — N400 Enlarged prostate without lower urinary tract symptoms: Secondary | ICD-10-CM

## 2022-06-16 LAB — CBC WITH DIFFERENTIAL/PLATELET
Basophils Absolute: 0.1 10*3/uL (ref 0.0–0.1)
Basophils Relative: 1.7 % (ref 0.0–3.0)
Eosinophils Absolute: 0.3 10*3/uL (ref 0.0–0.7)
Eosinophils Relative: 5.2 % — ABNORMAL HIGH (ref 0.0–5.0)
HCT: 41.9 % (ref 39.0–52.0)
Hemoglobin: 14.1 g/dL (ref 13.0–17.0)
Lymphocytes Relative: 28.9 % (ref 12.0–46.0)
Lymphs Abs: 1.9 10*3/uL (ref 0.7–4.0)
MCHC: 33.6 g/dL (ref 30.0–36.0)
MCV: 93.1 fl (ref 78.0–100.0)
Monocytes Absolute: 0.5 10*3/uL (ref 0.1–1.0)
Monocytes Relative: 8 % (ref 3.0–12.0)
Neutro Abs: 3.7 10*3/uL (ref 1.4–7.7)
Neutrophils Relative %: 56.2 % (ref 43.0–77.0)
Platelets: 242 10*3/uL (ref 150.0–400.0)
RBC: 4.5 Mil/uL (ref 4.22–5.81)
RDW: 13.6 % (ref 11.5–15.5)
WBC: 6.5 10*3/uL (ref 4.0–10.5)

## 2022-06-16 LAB — BASIC METABOLIC PANEL
BUN: 28 mg/dL — ABNORMAL HIGH (ref 6–23)
CO2: 34 mEq/L — ABNORMAL HIGH (ref 19–32)
Calcium: 9.9 mg/dL (ref 8.4–10.5)
Chloride: 103 mEq/L (ref 96–112)
Creatinine, Ser: 1.05 mg/dL (ref 0.40–1.50)
GFR: 68.95 mL/min (ref >60.00)
Glucose, Bld: 83 mg/dL (ref 70–99)
Potassium: 4.6 mEq/L (ref 3.5–5.1)
Sodium: 142 mEq/L (ref 135–145)

## 2022-06-16 LAB — VITAMIN B12: Vitamin B-12: 677 pg/mL (ref 211–911)

## 2022-06-16 NOTE — Progress Notes (Signed)
Subjective:    [name], male    DOB: [birth year], 77 y.o.   MRN: [id number]  Patient here for  Chief Complaint  Patient presents with   Referral    Discuss neurology referral    Dizziness   Memory Loss    HPI Here as a work in to discuss memory changes.  Reports he has noticed some changes where he is not able to remember what he has been told.  This has been going on form more than 6 months.  A little more difficulty doing his finances.  Able to do them, but having to think more about it.  Has hearing aids.  Discussed need to confirm can hear well.  This could be affecting - not being able to remember what he is being told - due to not hearing fully.  He stays active.  Exercises.  Reads.  Some numbness in feet - may cause some balance issues.  Long term tinnitus.  No headache.  No room spinning.  No chest pain or sob.  Eating and drinking ok.  No vomiting or diarrhea. Seeing urology - s/u urethral dilatation 06/10/22.     Past Medical History:  Diagnosis Date   Arthritis    Diverticulosis, sigmoid    Environmental allergies    Frequency of urination    H/O sleep apnea    S/P  OSA surgery -- resolved   Hemorrhoids    History of BPH    History of melanoma excision    back   History of nephritis    age 55   History of squamous cell carcinoma excision    right arm, leg, face   History of urinary retention    secondary bph with obstruction   Melanoma ([place])    Sleep apnea    Urethral stricture    Past Surgical History:  Procedure Laterality Date   cataracts Bilateral    2023   COLONOSCOPY WITH PROPOFOL N/A 06/13/2017   Procedure: COLONOSCOPY WITH PROPOFOL;  Surgeon: Lollie Sails, MD;  Location: [facility] ENDOSCOPY;  Service: Endoscopy;  Laterality: N/A;   CYSTOSCOPY WITH INJECTION N/A 06/10/2022   Procedure: CYSTOSCOPY WITH INJECTION OF MITOMYCIN;  Surgeon: Alexis Frock, MD;  Location: [facility];  Service: Urology;  Laterality: N/A;    CYSTOSCOPY WITH RETROGRADE URETHROGRAM N/A 05/08/2014   Procedure: CYSTOSCOPY WITH RETROGRADE URETHROGRAM;  Surgeon: Alexis Frock, MD;  Location: [facility];  Service: Urology;  Laterality: N/A;   CYSTOSCOPY WITH URETHRAL DILATATION N/A 05/08/2014   Procedure: CYSTOSCOPY WITH URETHRAL DILATATION;  Surgeon: Alexis Frock, MD;  Location[facility];  Service: Urology;  Laterality: N/A;   CYSTOSCOPY WITH URETHRAL DILATATION N/A 06/10/2022   Procedure: CYSTOSCOPY WITH URETHRAL DILATATION;  Surgeon: Alexis Frock, MD;  Location: [facility];  Service: Urology;  Laterality: N/A;  45 MINS   KNEE ARTHROSCOPY W/ MENISCECTOMY Right x2   last one 2005   LAPAROSCOPIC INGUINAL HERNIA REPAIR Right 2007 (approx)   ORIF  RIGHT WRIST FX  04/25/2006   retained hardware   ROBOT ASSISTED LAPAROSCOPIC RADICAL PROSTATECTOMY N/A 03/19/2014   Procedure: ROBOTIC ASSISTED LAPAROSCOPIC SIMPLE PROSTATECTOMY;  Surgeon: Alexis Frock, MD;  Location: [facility];  Service: Urology;  Laterality: N/A;   TONSILLECTOMY  age 55   UVULOPALATOPHARYNGOPLASTY  x5  last one 24   Family History  Problem Relation Age of Onset   Heart disease Father        CABG  x 4   Arthritis Father    Glaucoma Father    Hepatitis C Mother    Thyroid disease Sister    Social History   Socioeconomic History   Marital status: Married    Spouse name: Not on file   Number of children: Not on file   Years of education: Not on file   Highest education level: Not on file  Occupational History   Not on file  Tobacco Use   Smoking status: Never   Smokeless tobacco: Never  Vaping Use   Vaping Use: Never used  Substance and Sexual Activity   Alcohol use: Yes    Alcohol/week: 0.0 standard drinks of alcohol    Comment: rare   Drug use: No   Sexual activity: Not on file  Other Topics Concern   Not on file  Social History Narrative   Not on file   Social Determinants of Health   Financial  Resource Strain: Low Risk  (02/03/2022)   Overall Financial Resource Strain (CARDIA)    Difficulty of Paying Living Expenses: Not hard at all  Food Insecurity: No Food Insecurity (02/03/2022)   Hunger Vital Sign    Worried About Running Out of Food in the Last Year: Never true    Ran Out of Food in the Last Year: Never true  Transportation Needs: No Transportation Needs (02/03/2022)   PRAPARE - Hydrologist (Medical): No    Lack of Transportation (Non-Medical): No  Physical Activity: Sufficiently Active (02/03/2022)   Exercise Vital Sign    Days of Exercise per Week: 3 days    Minutes of Exercise per Session: 60 min  Stress: No Stress Concern Present (02/03/2022)   [place]    Feeling of Stress : Not at all  Social Connections: Unknown (02/03/2022)   Social Connection and Isolation Panel [NHANES]    Frequency of Communication with Friends and Family: Not on file    Frequency of Social Gatherings with Friends and Family: More than three times a week    Attends Religious Services: Not on file    Active Member of Clubs or Organizations: Not on file    Attends Archivist Meetings: Not on file    Marital Status: Married     Review of Systems  Constitutional:  Negative for appetite change and unexpected weight change.  HENT:  Negative for congestion and sinus pressure.   Respiratory:  Negative for cough, chest tightness and shortness of breath.   Cardiovascular:  Negative for chest pain and palpitations.  Gastrointestinal:  Negative for abdominal pain, diarrhea, nausea and vomiting.  Genitourinary:  Negative for difficulty urinating and dysuria.  Musculoskeletal:  Negative for joint swelling and myalgias.  Skin:  Negative for color change and rash.  Neurological:  Negative for dizziness and headaches.  Psychiatric/Behavioral:  Negative for agitation and dysphoric mood.         Objective:     BP 122/70   Pulse 69   Temp 98 F (36.7 C)   Resp 16   Ht '5\' 9"'$  (1.753 m)   Wt 170 lb (77.1 kg)   SpO2 98%   BMI 25.10 kg/m  Wt Readings from Last 3 Encounters:  06/16/22 170 lb (77.1 kg)  06/10/22 169 lb 9.6 oz (76.9 kg)  04/22/22 163 lb (73.9 kg)    Physical Exam Vitals reviewed.  Constitutional:      General: He is not in  acute distress.    Appearance: Normal appearance. He is well-developed.  HENT:     Head: Normocephalic and atraumatic.     Right Ear: External ear normal.     Left Ear: External ear normal.  Eyes:     General: No scleral icterus.       Right eye: No discharge.        Left eye: No discharge.     Conjunctiva/sclera: Conjunctivae normal.  Cardiovascular:     Rate and Rhythm: Normal rate and regular rhythm.  Pulmonary:     Effort: Pulmonary effort is normal. No respiratory distress.     Breath sounds: Normal breath sounds.  Abdominal:     General: Bowel sounds are normal.     Palpations: Abdomen is soft.     Tenderness: There is no abdominal tenderness.  Musculoskeletal:        General: No swelling or tenderness.     Cervical back: Neck supple. No tenderness.  Lymphadenopathy:     Cervical: No cervical adenopathy.  Skin:    Findings: No erythema or rash.  Neurological:     Mental Status: He is alert.  Psychiatric:        Mood and Affect: Mood normal.        Behavior: Behavior normal.      Outpatient Encounter Medications as of 06/16/2022  Medication Sig   acetaminophen (TYLENOL) 500 MG tablet Take 500 mg by mouth every 6 (six) hours as needed.   Acetylcarnitine HCl (ACETYL L-CARNITINE PO) Take 300 mg by mouth.   Alpha-Lipoic Acid 300 MG TABS Take by mouth.   Boswellia-Glucosamine-Vit D (OSTEO BI-FLEX ONE PER DAY PO) Take by mouth.   Coenzyme Q10 (COQ10 PO) Take by mouth.   DOCUSATE SODIUM PO Take by mouth.   fexofenadine-pseudoephedrine (ALLEGRA-D 24) 180-240 MG 24 hr tablet Take 1 tablet by mouth daily.   finasteride  (PROSCAR) 5 MG tablet Take 5 mg by mouth daily.   ipratropium (ATROVENT) 0.06 % nasal spray Place 2 sprays into both nostrils 4 (four) times daily.   levocetirizine (XYZAL) 5 MG tablet Take 5 mg by mouth at bedtime.   meloxicam (MOBIC) 15 MG tablet Take 1 tablet (15 mg total) by mouth daily.   montelukast (SINGULAIR) 10 MG tablet Take 10 mg by mouth at bedtime.   Multiple Vitamins-Minerals (CENTRUM SILVER ADULT 50+ PO) Take 1 tablet by mouth daily.   Omega-3 Fatty Acids (FISH OIL PO) Take by mouth.   oxybutynin (DITROPAN) 5 MG tablet TK 1 T PO Q 8 H PRF URINARY FREQUENCY OR URGENCY   rosuvastatin (CRESTOR) 20 MG tablet TAKE 1 TABLET BY MOUTH ONCE DAILY.   triamcinolone cream (KENALOG) 0.1 % Apply topically 2 (two) times daily.   [DISCONTINUED] diclofenac (VOLTAREN) 75 MG EC tablet Take 1 tablet every 12 hours by oral route. (Patient not taking: Reported on 06/10/2022)   [DISCONTINUED] fluticasone (FLONASE) 50 MCG/ACT nasal spray SMARTSIG:1-2 Spray(s) Both Nares Daily   [DISCONTINUED] promethazine-dextromethorphan (PROMETHAZINE-DM) 6.25-15 MG/5ML syrup Take 5 mLs by mouth 4 (four) times daily as needed. (Patient not taking: Reported on 06/10/2022)   [DISCONTINUED] senna-docusate (SENOKOT-S) 8.6-50 MG tablet Take 1 tablet by mouth 2 (two) times daily. While taking strong pain meds to prevent consipation   [DISCONTINUED] traMADol (ULTRAM) 50 MG tablet Take 1-2 tablets (50-100 mg total) by mouth every 6 (six) hours as needed for moderate pain or severe pain (post-operatively).   No facility-administered encounter medications on file as of 06/16/2022.  Lab Results  Component Value Date   WBC 6.5 06/16/2022   HGB 14.1 06/16/2022   HCT 41.9 06/16/2022   PLT 242.0 06/16/2022   GLUCOSE 83 06/16/2022   CHOL 139 01/26/2022   TRIG 74.0 01/26/2022   HDL 45.10 01/26/2022   LDLCALC 79 01/26/2022   ALT 16 01/26/2022   AST 19 01/26/2022   NA 142 06/16/2022   K 4.6 06/16/2022   CL 103 06/16/2022    CREATININE 1.05 06/16/2022   BUN 28 (H) 06/16/2022   CO2 34 (H) 06/16/2022   TSH 1.64 01/26/2022   PSA 1.20 01/26/2022    No results found.     Assessment & Plan:  Memory change Assessment & Plan: Memory change as outlined.  Discussed focus and concentration.  Discussed confirm hearing well - has hearing aids.  MMSE 30/30.  Check labs, including B12.  Discussed further evaluation.  Request referral to neurology.   Orders: -     CBC with Differential/Platelet -     Basic metabolic panel -     Vitamin B12 -     Ambulatory referral to Neurology  Right lower quadrant pain Assessment & Plan: Pain right inguinal area.  No severe pain, but has noticed some discomfort.  Question of inguinal hernia on exam.  Will have surgery evaluate.    Orders: -     Ambulatory referral to General Surgery  Abdominal aortic aneurysm (AAA) without rupture, unspecified part [facility]) Assessment & Plan: Evaluated 06/29/21 - Previous duplex ultrasound from 06/28/2021 showed: AAA 2.58 cm and stable internal iliac artery aneurysms measuring 1.3 cm in the right and 1.2 cm on the left.  Triphasic blood flow throughout the aortoiliac system.  Lower extremity duplex shows Rt pop 0.90 cm and left pop 0.94 cm aneurysms. Recommended f/u ultrasound in 24 months.    Benign prostatic hyperplasia without lower urinary tract symptoms Assessment & Plan: S/p simple prostatectomy.  Followed by urology. Continue finasteride and oxybutynin.    Hematuria, unspecified type Assessment & Plan: Urology 04/26/22 - Alexis Frock - felt likely from some partial prostatic fossa growth.  06/10/22 - cysto with urethral dilatation.    Hypercholesterolemia Assessment & Plan: On crestor.  Low cholesterol diet and exercise.  Follow lipid panel and liver function tests.     Iliac artery aneurysm [facility]) Assessment & Plan: Evaluated by vascular 06/2021.  Recommended f/u in 24 months.       Einar Pheasant, MD

## 2022-06-26 ENCOUNTER — Encounter: Payer: Self-pay | Admitting: Internal Medicine

## 2022-06-26 DIAGNOSIS — R1031 Right lower quadrant pain: Secondary | ICD-10-CM | POA: Insufficient documentation

## 2022-06-26 NOTE — Assessment & Plan Note (Signed)
Memory change as outlined.  Discussed focus and concentration.  Discussed confirm hearing well - has hearing aids.  MMSE 30/30.  Check labs, including B12.  Discussed further evaluation.  Request referral to neurology.

## 2022-06-26 NOTE — Assessment & Plan Note (Signed)
On crestor.  Low cholesterol diet and exercise.  Follow lipid panel and liver function tests.   

## 2022-06-26 NOTE — Assessment & Plan Note (Signed)
Urology 04/26/22 - Kyle Meadows - felt likely from some partial prostatic fossa growth.  06/10/22 - cysto with urethral dilatation.

## 2022-06-26 NOTE — Assessment & Plan Note (Signed)
Evaluated by vascular 06/2021.  Recommended f/u in 24 months.

## 2022-06-26 NOTE — Assessment & Plan Note (Signed)
S/p simple prostatectomy.  Followed by urology. Continue finasteride and oxybutynin.

## 2022-06-26 NOTE — Assessment & Plan Note (Signed)
Evaluated 06/29/21 - Previous duplex ultrasound from 06/28/2021 showed: AAA 2.58 cm and stable internal iliac artery aneurysms measuring 1.3 cm in the right and 1.2 cm on the left.  Triphasic blood flow throughout the aortoiliac system.  Lower extremity duplex shows Rt pop 0.90 cm and left pop 0.94 cm aneurysms. Recommended f/u ultrasound in 24 months.

## 2022-06-26 NOTE — Assessment & Plan Note (Signed)
Pain right inguinal area.  No severe pain, but has noticed some discomfort.  Question of inguinal hernia on exam.  Will have surgery evaluate.

## 2022-06-27 ENCOUNTER — Encounter: Payer: Self-pay | Admitting: Physician Assistant

## 2022-06-27 DIAGNOSIS — M5416 Radiculopathy, lumbar region: Secondary | ICD-10-CM | POA: Diagnosis not present

## 2022-06-27 DIAGNOSIS — M545 Low back pain, unspecified: Secondary | ICD-10-CM | POA: Diagnosis not present

## 2022-06-30 ENCOUNTER — Other Ambulatory Visit: Payer: Self-pay | Admitting: Orthopedic Surgery

## 2022-06-30 DIAGNOSIS — M545 Low back pain, unspecified: Secondary | ICD-10-CM

## 2022-07-04 ENCOUNTER — Ambulatory Visit: Payer: PPO

## 2022-07-04 ENCOUNTER — Encounter: Payer: Self-pay | Admitting: Physician Assistant

## 2022-07-04 ENCOUNTER — Ambulatory Visit: Payer: PPO | Admitting: Physician Assistant

## 2022-07-04 VITALS — BP 149/88 | HR 83 | Resp 18 | Ht 69.0 in | Wt 161.0 lb

## 2022-07-04 DIAGNOSIS — R413 Other amnesia: Secondary | ICD-10-CM

## 2022-07-04 NOTE — Patient Instructions (Addendum)
It was a pleasure to see you today at our office.   Recommendations:  Neurocognitive evaluation at our office MRI of the brain, the radiology office will call you to arrange you appointment Follow up in 1 month  Whom to call:  Memory  decline, memory medications: Call our office [phone]   For psychiatric meds, mood meds: Please have your primary care physician manage these medications.    For assessment of decision of mental capacity and competency:  Call Dr. Anthoney Harada, geriatric psychiatrist at [phone]  For guidance in geriatric dementia issues please call [facility] [phone]   If you have any severe symptoms of a stroke, or other severe issues such as confusion,severe chills or fever, etc call 911 or go to the ER as you may need to be evaluated further      RECOMMENDATIONS FOR ALL PATIENTS WITH MEMORY PROBLEMS: 1. Continue to exercise (Recommend 30 minutes of walking everyday, or 3 hours every week) 2. Increase social interactions - continue going to [place] and enjoy social gatherings with friends and family 3. Eat healthy, avoid fried foods and eat more fruits and vegetables 4. Maintain adequate blood pressure, blood sugar, and blood cholesterol level. Reducing the risk of stroke and cardiovascular disease also helps promoting better memory. 5. Avoid stressful situations. Live a simple life and avoid aggravations. Organize your time and prepare for the next day in anticipation. 6. Sleep well, avoid any interruptions of sleep and avoid any distractions in the bedroom that may interfere with adequate sleep quality 7. Avoid sugar, avoid sweets as there is a strong link between excessive sugar intake, diabetes, and cognitive impairment We discussed the Mediterranean diet, which has been shown to help patients reduce the risk of progressive memory disorders and reduces cardiovascular risk. This includes eating fish, eat fruits and green leafy vegetables,  nuts like almonds and hazelnuts, walnuts, and also use olive oil. Avoid fast foods and fried foods as much as possible. Avoid sweets and sugar as sugar use has been linked to worsening of memory function.  There is always a concern of gradual progression of memory problems. If this is the case, then we may need to adjust level of care according to patient needs. Support, both to the patient and caregiver, should then be put into place.      You have been referred for a neuropsychological evaluation (i.e., evaluation of memory and thinking abilities). Please bring someone with you to this appointment if possible, as it is helpful for the doctor to hear from both you and another adult who knows you well. Please bring eyeglasses and hearing aids if you wear them.    The evaluation will take approximately 3 hours and has two parts:   The first part is a clinical interview with the neuropsychologist (Dr. Melvyn Novas or Dr. Nicole Kindred). During the interview, the neuropsychologist will speak with you and the individual you brought to the appointment.    The second part of the evaluation is testing with the doctor's technician Hinton Dyer or Maudie Mercury). During the testing, the technician will ask you to remember different types of material, solve problems, and answer some questionnaires. Your family member will not be present for this portion of the evaluation.   Please note: We must reserve several hours of the neuropsychologist's time and the psychometrician's time for your evaluation appointment. As such, there is a No-Show fee of $100. If you are unable to attend any of your appointments, please contact our office as  soon as possible to reschedule.    FALL PRECAUTIONS: Be cautious when walking. Scan the area for obstacles that may increase the risk of trips and falls. When getting up in the mornings, sit up at the edge of the bed for a few minutes before getting out of bed. Consider elevating the bed at the head end to  avoid drop of blood pressure when getting up. Walk always in a well-lit room (use night lights in the walls). Avoid area rugs or power cords from appliances in the middle of the walkways. Use a walker or a cane if necessary and consider physical therapy for balance exercise. Get your eyesight checked regularly.  FINANCIAL OVERSIGHT: Supervision, especially oversight when making financial decisions or transactions is also recommended.  HOME SAFETY: Consider the safety of the kitchen when operating appliances like stoves, microwave oven, and blender. Consider having supervision and share cooking responsibilities until no longer able to participate in those. Accidents with firearms and other hazards in the house should be identified and addressed as well.   ABILITY TO BE LEFT ALONE: If patient is unable to contact 911 operator, consider using LifeLine, or when the need is there, arrange for someone to stay with patients. Smoking is a fire hazard, consider supervision or cessation. Risk of wandering should be assessed by caregiver and if detected at any point, supervision and safe proof recommendations should be instituted.  MEDICATION SUPERVISION: Inability to self-administer medication needs to be constantly addressed. Implement a mechanism to ensure safe administration of the medications.   DRIVING: Regarding driving, in patients with progressive memory problems, driving will be impaired. We advise to have someone else do the driving if trouble finding directions or if minor accidents are reported. Independent driving assessment is available to determine safety of driving.   If you are interested in the driving assessment, you can contact the following:  [employer] in [place]  [place] [phone] or [phone]    [place] refers to food  and lifestyle choices that are based on the traditions of countries located on the [employer]. This way of eating has been shown to help prevent certain conditions and improve outcomes for people who have chronic diseases, like kidney disease and heart disease. What are tips for following this plan? Lifestyle  Cook and eat meals together with your family, when possible. Drink enough fluid to keep your urine clear or pale yellow. Be physically active every day. This includes: Aerobic exercise like running or swimming. Leisure activities like gardening, walking, or housework. Get 7-8 hours of sleep each night. If recommended by your health care provider, drink red wine in moderation. This means 1 glass a day for nonpregnant women and 2 glasses a day for men. A glass of wine equals 5 oz (150 mL). Reading food labels  Check the serving size of packaged foods. For foods such as rice and pasta, the serving size refers to the amount of cooked product, not dry. Check the total fat in packaged foods. Avoid foods that have saturated fat or trans fats. Check the ingredients list for added sugars, such as corn syrup. Shopping  At the grocery store, buy most of your food from the areas near the walls of the store. This includes: Fresh fruits and vegetables (produce). Grains, beans, nuts, and seeds. Some of these may be available in unpackaged forms or large amounts (in bulk). Fresh seafood.  Poultry and eggs. Low-fat dairy products. Buy whole ingredients instead of prepackaged foods. Buy fresh fruits and vegetables in-season from local farmers markets. Buy frozen fruits and vegetables in resealable bags. If you do not have access to quality fresh seafood, buy precooked frozen shrimp or canned fish, such as tuna, salmon, or sardines. Buy small amounts of raw or cooked vegetables, salads, or olives from the deli or salad bar at your store. Stock your pantry so you always have certain foods on hand,  such as olive oil, canned tuna, canned tomatoes, rice, pasta, and beans. Cooking  Cook foods with extra-virgin olive oil instead of using butter or other vegetable oils. Have meat as a side dish, and have vegetables or grains as your main dish. This means having meat in small portions or adding small amounts of meat to foods like pasta or stew. Use beans or vegetables instead of meat in common dishes like chili or lasagna. Experiment with different cooking methods. Try roasting or broiling vegetables instead of steaming or sauteing them. Add frozen vegetables to soups, stews, pasta, or rice. Add nuts or seeds for added healthy fat at each meal. You can add these to yogurt, salads, or vegetable dishes. Marinate fish or vegetables using olive oil, lemon juice, garlic, and fresh herbs. Meal planning  Plan to eat 1 vegetarian meal one day each week. Try to work up to 2 vegetarian meals, if possible. Eat seafood 2 or more times a week. Have healthy snacks readily available, such as: Vegetable sticks with hummus. Greek yogurt. Fruit and nut trail mix. Eat balanced meals throughout the week. This includes: Fruit: 2-3 servings a day Vegetables: 4-5 servings a day Low-fat dairy: 2 servings a day Fish, poultry, or lean meat: 1 serving a day Beans and legumes: 2 or more servings a week Nuts and seeds: 1-2 servings a day Whole grains: 6-8 servings a day Extra-virgin olive oil: 3-4 servings a day Limit red meat and sweets to only a few servings a month What are my food choices? Mediterranean diet Recommended Grains: Whole-grain pasta. Brown rice. Bulgar wheat. Polenta. Couscous. Whole-wheat bread. [place]. Vegetables: Artichokes. Beets. Broccoli. Cabbage. Carrots. Eggplant. Green beans. Chard. Kale. Spinach. Onions. Leeks. Peas. Squash. Tomatoes. Peppers. Radishes. Fruits: Apples. Apricots. Avocado. Berries. Bananas. Cherries. Dates. Figs. Grapes. Lemons. Melon. Oranges. Peaches. Plums.  Pomegranate. Meats and other protein foods: Beans. Almonds. Sunflower seeds. Pine nuts. Peanuts. [place]. Salmon. Scallops. Shrimp. [place]. Tilapia. Clams. Oysters. Eggs. Dairy: Low-fat milk. Cheese. Greek yogurt. Beverages: Water. Red wine. Herbal tea. Fats and oils: Extra virgin olive oil. Avocado oil. Grape seed oil. Sweets and desserts: [place] yogurt with honey. Baked apples. Poached pears. Trail mix. Seasoning and other foods: Basil. Cilantro. Coriander. Cumin. Mint. Parsley. Sage. Rosemary. Tarragon. Garlic. Oregano. Thyme. Pepper. Balsalmic vinegar. Tahini. Hummus. Tomato sauce. Olives. Mushrooms. Limit these Grains: Prepackaged pasta or rice dishes. Prepackaged cereal with added sugar. Vegetables: Deep fried potatoes (french fries). Fruits: Fruit canned in syrup. Meats and other protein foods: Beef. Pork. Lamb. Poultry with skin. Hot dogs. Bernie[place] Salines. Dairy: Ice cream. Sour cream. Whole milk. Beverages: Juice. Sugar-sweetened soft drinks. Beer. Liquor and spirits. Fats and oils: Butter. Canola oil. Vegetable oil. Beef fat (tallow). Lard. Sweets and desserts: Cookies. Cakes. Pies. Candy. Seasoning and other foods: Mayonnaise. Premade sauces and marinades. The items listed may not be a complete list. Talk with your dietitian about what dietary choices are right for you. Summary The Mediterranean diet includes both food and lifestyle choices. Eat a variety  of fresh fruits and vegetables, beans, nuts, seeds, and whole grains. Limit the amount of red meat and sweets that you eat. Talk with your health care provider about whether it is safe for you to drink red wine in moderation. This means 1 glass a day for nonpregnant women and 2 glasses a day for men. A glass of wine equals 5 oz (150 mL). This information is not intended to replace advice given to you by your health care provider. Make sure you discuss any questions you have with your health care provider. Document Released: 12/03/2015 Document  Revised: 01/05/2016 Document Reviewed: 12/03/2015 Elsevier Interactive Patient Education  2017 [facility] [phone]

## 2022-07-04 NOTE — Progress Notes (Addendum)
Assessment/Plan:   Kyle Meadows is a very pleasant 77 y.o. year old RH male with a history of hypertension, hyperlipidemia, BPH, iliac artery aneurysm, arthritis, sleep apnea status post OSA surgery, chronic lumbar radiculopathy, hard of hearing (wear hearing aids), seen today for evaluation of memory loss. MoCA today is 25/30. He is able to perform ADLc without difficulty.  Most recent labs were normal.  Other workup is in progress.    Memory Impairment of unclear etiology  MRI brain without contrast to assess for underlying structural abnormality and assess vascular load  Neurocognitive testing to further evaluate cognitive concerns and determine other underlying cause of memory changes, including potential contribution from sleep, anxiety, or depression   Folllow up in 2 months to discuss the results of the MRI  Subjective:   The patient is here alone. Son and 3 grandchildren so he is moving to [place] in the fall  How long did patient have memory difficulties?  He has difficulty with concentration for the last year.  Patient has some difficulty remembering recent conversations and people names.  He is able to read without difficulty, and enjoys doing painting, he also has an interesting architecture.  He  left the car running on his garage (the door was open), for about 45 minutes.    repeats oneself?  Endorsed Disoriented when walking into a room?  Patient denies except occasionally not remembering what he came to the room for   Leaving objects in unusual places? Leaves the glasses "all over the place ".   Wandering behavior?  denies   Any personality changes since last visit?  Patient denies   Any depression?:  Patient denies   Hallucinations or paranoia?  Patient denies   Seizures?   Patient denies    Any sleep changes?  Denies vivid dreams, REM behavior or sleepwalking   Sleep apnea?  Endorsed, "a long time ago, but surgery helped" Any hygiene concerns?  Patient denies    Independent of bathing and dressing?  Endorsed  Does the patient needs help with medications? Patient is in charge   Who is in charge of the finances? Patient and wife are both in charge.  "I do the more difficult stuff "     Any changes in appetite?  denies  Patient have trouble swallowing?  denies   Does the patient cook? Only on the grill . Denies any accidents Any headaches?   denies   Chronic back pain endorsed, he has a history of lumbar radiculopathy and cervical radiculopathy, followed as outpatient, status post PT Ambulates with difficulty?  He is careful with walking. Recent falls or head injuries?   denies    Unilateral weakness, numbness or tingling?  "I have a lot of arthritis on the foot and it causes some tingling " Any tremors?   denies   Any anosmia?  denies   Any incontinence of urine?They operated the prostate, occasionally I have a little accident.  Any bowel dysfunction?    denies      Patient lives with his wife.  He is planning to move to [facility] in the fall to be with his son grandchildren, he has another son also living in [place]. History of heavy alcohol intake? denies   History of heavy tobacco use?  denies   Family history of dementia?  PGM, Mother and 2 sis  dementia? Type  Does patient drive? Endorsed    Past Medical History:  Diagnosis Date   Arthritis  Diverticulosis, sigmoid    Environmental allergies    Frequency of urination    H/O sleep apnea    S/P  OSA surgery -- resolved   Hemorrhoids    History of BPH    History of melanoma excision    back   History of nephritis    age 89   History of squamous cell carcinoma excision    right arm, leg, face   History of urinary retention    secondary bph with obstruction   Melanoma ([place])    Sleep apnea    Urethral stricture      Past Surgical History:  Procedure Laterality Date   cataracts Bilateral    2023   COLONOSCOPY WITH PROPOFOL N/A 06/13/2017   Procedure: COLONOSCOPY WITH  PROPOFOL;  Surgeon: Lollie Sails, MD;  Location: [facility] ENDOSCOPY;  Service: Endoscopy;  Laterality: N/A;   CYSTOSCOPY WITH INJECTION N/A 06/10/2022   Procedure: CYSTOSCOPY WITH INJECTION OF MITOMYCIN;  Surgeon: Alexis Frock, MD;  Location[facility];  Service: Urology;  Laterality: N/A;   CYSTOSCOPY WITH RETROGRADE URETHROGRAM N/A 05/08/2014   Procedure: CYSTOSCOPY WITH RETROGRADE URETHROGRAM;  Surgeon: Alexis Frock, MD;  Location[facility];  Service: Urology;  Laterality: N/A;   CYSTOSCOPY WITH URETHRAL DILATATION N/A 05/08/2014   Procedure: CYSTOSCOPY WITH URETHRAL DILATATION;  Surgeon: Alexis Frock, MD;  Location: [facility];  Service: Urology;  Laterality: N/A;   CYSTOSCOPY WITH URETHRAL DILATATION N/A 06/10/2022   Procedure: CYSTOSCOPY WITH URETHRAL DILATATION;  Surgeon: Alexis Frock, MD;  Location: [facility];  Service: Urology;  Laterality: N/A;  45 MINS   KNEE ARTHROSCOPY W/ MENISCECTOMY Right x2   last one 2005   LAPAROSCOPIC INGUINAL HERNIA REPAIR Right 2007 (approx)   ORIF  RIGHT WRIST FX  04/25/2006   retained hardware   ROBOT ASSISTED LAPAROSCOPIC RADICAL PROSTATECTOMY N/A 03/19/2014   Procedure: ROBOTIC ASSISTED LAPAROSCOPIC SIMPLE PROSTATECTOMY;  Surgeon: Alexis Frock, MD;  Location: [facility];  Service: Urology;  Laterality: N/A;   TONSILLECTOMY  age 67   UVULOPALATOPHARYNGOPLASTY  x5  last one 1995     Allergies  Allergen Reactions   Hydrocodone Other (See Comments)    hyper   Percocet [Oxycodone-Acetaminophen] Other (See Comments)    "keeps me wide awake" - can take tylenol per pt    Current Outpatient Medications  Medication Instructions   acetaminophen (TYLENOL) 500 mg, Oral, Every 6 hours PRN   Acetylcarnitine HCl (ACETYL L-CARNITINE PO) 300 mg, Oral   Alpha-Lipoic Acid 300 MG TABS Oral   [place]-Glucosamine-Vit D (OSTEO BI-FLEX ONE PER DAY PO) Oral   Coenzyme Q10 (COQ10 PO) Oral    DOCUSATE SODIUM PO Oral   fexofenadine-pseudoephedrine (ALLEGRA-D 24) 180-240 MG 24 hr tablet 1 tablet, Oral, Daily   finasteride (PROSCAR) 5 mg, Oral, Daily   ipratropium (ATROVENT) 0.06 % nasal spray 2 sprays, Each Nare, 4 times daily   levocetirizine (XYZAL) 5 mg, Oral, Daily at bedtime   meloxicam (MOBIC) 15 mg, Oral, Daily   methylPREDNISolone (MEDROL DOSEPAK) 4 MG TBPK tablet Take by mouth.   montelukast (SINGULAIR) 10 mg, Oral, Daily at bedtime   Multiple Vitamins-Minerals (CENTRUM SILVER ADULT 50+ PO) 1 tablet, Oral, Daily   Omega-3 Fatty Acids (FISH OIL PO) Oral   oxybutynin (DITROPAN) 5 MG tablet TK 1 T PO Q 8 H PRF URINARY FREQUENCY OR URGENCY   rosuvastatin (CRESTOR) 20 MG tablet TAKE 1 TABLET BY MOUTH ONCE DAILY.   triamcinolone cream (  KENALOG) 0.1 % Topical, 2 times daily     VITALS:   Vitals:   07/04/22 0945  BP: (!) 149/88  Pulse: 83  Resp: 18  SpO2: 97%  Weight: 161 lb (73 kg)  Height: '5\' 9"'$  (1.753 m)      02/03/2022   10:28 AM 01/11/2021    8:28 AM 01/09/2020    8:53 AM 05/20/2019    1:07 PM 01/08/2019    8:53 AM  Depression screen PHQ 2/9  Decreased Interest 0 0 0 0 0  Down, Depressed, Hopeless 0 0 0 0 0  PHQ - 2 Score 0 0 0 0 0  Altered sleeping     0  Tired, decreased energy     0  Change in appetite     0  Feeling bad or failure about yourself      0  Trouble concentrating     0  Moving slowly or fidgety/restless     0  Suicidal thoughts     0  PHQ-9 Score     0    PHYSICAL EXAM   HEENT:  Normocephalic, atraumatic. The mucous membranes are moist. The superficial temporal arteries are without ropiness or tenderness. Cardiovascular: Regular rate and rhythm. Lungs: Clear to auscultation bilaterally. Neck: There are no carotid bruits noted bilaterally.  NEUROLOGICAL:     No data to display             06/16/2022    1:50 PM 01/01/2018    9:18 AM 12/27/2016    1:23 PM  MMSE - Mini Mental State Exam  Orientation to time '5 5 5  '$ Orientation to  Place '5 5 5  '$ Registration '3 3 3  '$ Attention/ Calculation '5 5 5  '$ Recall '2 3 3  '$ Language- name 2 objects '2 2 2  '$ Language- repeat '1 1 1  '$ Language- follow 3 step command '3 3 3  '$ Language- read & follow direction '1 1 1  '$ Write a sentence '1 1 1  '$ Copy design '1 1 1  '$ Total score '29 30 30     '$ Orientation:  Alert and oriented to person, place and time. No aphasia or dysarthria. Fund of knowledge is appropriate. Recent memory impaired and remote memory intact.  Attention and concentration are normal.  Able to name objects and repeat phrases. Delayed recall    Cranial nerves: There is good facial symmetry. Extraocular muscles are intact and visual fields are full to confrontational testing. Speech is fluent and clear. No tongue deviation. Hearing is intact to conversational tone. Tone: Tone is good throughout. Sensation: Sensation is intact to light touch and pinprick throughout. Vibration is intact at the bilateral big toe.There is no extinction with double simultaneous stimulation. There is no sensory dermatomal level identified. Coordination: The patient has no difficulty with RAM's or FNF bilaterally. Normal finger to nose  Motor: Strength is 5/5 in the bilateral upper and lower extremities. There is no pronator drift. There are no fasciculations noted. DTR's: Deep tendon reflexes are 2/4 at the bilateral biceps, triceps, brachioradialis, patella and achilles.  Plantar responses are downgoing bilaterally. Gait and Station: The patient is able to ambulate without difficulty.The patient is able to heel toe walk without any difficulty.The patient is able to ambulate in a tandem fashion. The patient is able to stand in the Romberg position.     Thank you for allowing [place] the opportunity to participate in the care of this nice patient. Please do not hesitate to contact us for  any questions or concerns.   Total time spent on today's visit was 42 minutes dedicated to this patient today, preparing to see  patient, examining the patient, ordering tests and/or medications and counseling the patient, documenting clinical information in the EHR or other health record, independently interpreting results and communicating results to the patient/family, discussing treatment and goals, answering patient's questions and coordinating care.  Cc:  Einar Pheasant, MD  Sharene Butters 07/04/2022 12:12 PM

## 2022-07-07 DIAGNOSIS — K4091 Unilateral inguinal hernia, without obstruction or gangrene, recurrent: Secondary | ICD-10-CM | POA: Diagnosis not present

## 2022-07-08 ENCOUNTER — Other Ambulatory Visit: Payer: Self-pay | Admitting: General Surgery

## 2022-07-08 ENCOUNTER — Ambulatory Visit
Admission: RE | Admit: 2022-07-08 | Discharge: 2022-07-08 | Disposition: A | Discharge status: Home / Self Care | Payer: PPO | Source: Ambulatory Visit | Attending: Orthopedic Surgery | Admitting: Orthopedic Surgery

## 2022-07-08 DIAGNOSIS — M545 Low back pain, unspecified: Secondary | ICD-10-CM

## 2022-07-08 DIAGNOSIS — K4091 Unilateral inguinal hernia, without obstruction or gangrene, recurrent: Secondary | ICD-10-CM

## 2022-07-08 DIAGNOSIS — M48061 Spinal stenosis, lumbar region without neurogenic claudication: Secondary | ICD-10-CM | POA: Diagnosis not present

## 2022-07-11 DIAGNOSIS — L111 Transient acantholytic dermatosis [Grover]: Secondary | ICD-10-CM | POA: Diagnosis not present

## 2022-07-11 DIAGNOSIS — D225 Melanocytic nevi of trunk: Secondary | ICD-10-CM | POA: Diagnosis not present

## 2022-07-11 DIAGNOSIS — L570 Actinic keratosis: Secondary | ICD-10-CM | POA: Diagnosis not present

## 2022-07-11 DIAGNOSIS — D2272 Melanocytic nevi of left lower limb, including hip: Secondary | ICD-10-CM | POA: Diagnosis not present

## 2022-07-11 DIAGNOSIS — D2261 Melanocytic nevi of right upper limb, including shoulder: Secondary | ICD-10-CM | POA: Diagnosis not present

## 2022-07-11 DIAGNOSIS — Z86006 Personal history of melanoma in-situ: Secondary | ICD-10-CM | POA: Diagnosis not present

## 2022-07-11 DIAGNOSIS — Z85820 Personal history of malignant melanoma of skin: Secondary | ICD-10-CM | POA: Diagnosis not present

## 2022-07-11 DIAGNOSIS — Z85828 Personal history of other malignant neoplasm of skin: Secondary | ICD-10-CM | POA: Diagnosis not present

## 2022-07-12 ENCOUNTER — Ambulatory Visit
Admission: RE | Admit: 2022-07-12 | Discharge: 2022-07-12 | Disposition: A | Discharge status: Home / Self Care | Payer: PPO | Source: Ambulatory Visit | Attending: Physician Assistant | Admitting: Physician Assistant

## 2022-07-12 DIAGNOSIS — I6782 Cerebral ischemia: Secondary | ICD-10-CM | POA: Diagnosis not present

## 2022-07-12 DIAGNOSIS — R413 Other amnesia: Secondary | ICD-10-CM | POA: Diagnosis not present

## 2022-07-17 NOTE — Progress Notes (Signed)
The MRI of the brain shows some circulation changes  and age related changes, but no  acute stroke or masses or fluid is seen, thanks

## 2022-07-19 ENCOUNTER — Ambulatory Visit
Admission: RE | Admit: 2022-07-19 | Discharge: 2022-07-19 | Disposition: A | Discharge status: Home / Self Care | Payer: PPO | Source: Ambulatory Visit | Attending: General Surgery | Admitting: General Surgery

## 2022-07-19 DIAGNOSIS — K4090 Unilateral inguinal hernia, without obstruction or gangrene, not specified as recurrent: Secondary | ICD-10-CM | POA: Diagnosis not present

## 2022-07-19 DIAGNOSIS — K4091 Unilateral inguinal hernia, without obstruction or gangrene, recurrent: Secondary | ICD-10-CM | POA: Insufficient documentation

## 2022-07-28 ENCOUNTER — Telehealth: Payer: Self-pay | Admitting: Internal Medicine

## 2022-07-28 DIAGNOSIS — E7800 Pure hypercholesterolemia, unspecified: Secondary | ICD-10-CM

## 2022-07-28 NOTE — Addendum Note (Signed)
Addended by: Roetta Sessions D on: 07/28/2022 02:19 PM   Modules accepted: Orders

## 2022-07-28 NOTE — Telephone Encounter (Signed)
Patient has a lab a lab appt 08/03/2022, there are No in.

## 2022-07-28 NOTE — Telephone Encounter (Signed)
Orders placed.

## 2022-08-01 DIAGNOSIS — M5416 Radiculopathy, lumbar region: Secondary | ICD-10-CM | POA: Diagnosis not present

## 2022-08-02 ENCOUNTER — Inpatient Hospital Stay: Admission: RE | Admit: 2022-08-02 | Payer: PPO | Source: Ambulatory Visit

## 2022-08-03 ENCOUNTER — Other Ambulatory Visit (INDEPENDENT_AMBULATORY_CARE_PROVIDER_SITE_OTHER): Payer: PPO

## 2022-08-03 DIAGNOSIS — E7800 Pure hypercholesterolemia, unspecified: Secondary | ICD-10-CM

## 2022-08-03 LAB — HEPATIC FUNCTION PANEL
ALT: 15 U/L (ref 0–53)
AST: 19 U/L (ref 0–37)
Albumin: 4.4 g/dL (ref 3.5–5.2)
Alkaline Phosphatase: 51 U/L (ref 39–117)
Bilirubin, Direct: 0.1 mg/dL (ref 0.0–0.3)
Total Bilirubin: 0.3 mg/dL (ref 0.2–1.2)
Total Protein: 6.3 g/dL (ref 6.0–8.3)

## 2022-08-03 LAB — BASIC METABOLIC PANEL
BUN: 30 mg/dL — ABNORMAL HIGH (ref 6–23)
CO2: 31 mEq/L (ref 19–32)
Calcium: 9.4 mg/dL (ref 8.4–10.5)
Chloride: 106 mEq/L (ref 96–112)
Creatinine, Ser: 0.99 mg/dL (ref 0.40–1.50)
GFR: 73.93 mL/min (ref >60.00)
Glucose, Bld: 86 mg/dL (ref 70–99)
Potassium: 4.7 mEq/L (ref 3.5–5.1)
Sodium: 143 mEq/L (ref 135–145)

## 2022-08-03 LAB — LIPID PANEL
Cholesterol: 158 mg/dL (ref 0–200)
HDL: 41.5 mg/dL (ref >39.00)
LDL Cholesterol: 96 mg/dL (ref 0–99)
NonHDL: 116.84
Total CHOL/HDL Ratio: 4
Triglycerides: 105 mg/dL (ref 0.0–149.0)
VLDL: 21 mg/dL (ref 0.0–40.0)

## 2022-08-05 ENCOUNTER — Ambulatory Visit (INDEPENDENT_AMBULATORY_CARE_PROVIDER_SITE_OTHER): Payer: PPO | Admitting: Internal Medicine

## 2022-08-05 ENCOUNTER — Encounter: Payer: Self-pay | Admitting: Internal Medicine

## 2022-08-05 VITALS — BP 110/70 | HR 74 | Temp 98.0°F | Resp 16 | Ht 69.0 in | Wt 171.0 lb

## 2022-08-05 DIAGNOSIS — R413 Other amnesia: Secondary | ICD-10-CM | POA: Diagnosis not present

## 2022-08-05 DIAGNOSIS — I714 Abdominal aortic aneurysm, without rupture, unspecified: Secondary | ICD-10-CM

## 2022-08-05 DIAGNOSIS — Z0000 Encounter for general adult medical examination without abnormal findings: Secondary | ICD-10-CM

## 2022-08-05 DIAGNOSIS — M5441 Lumbago with sciatica, right side: Secondary | ICD-10-CM | POA: Diagnosis not present

## 2022-08-05 DIAGNOSIS — M5442 Lumbago with sciatica, left side: Secondary | ICD-10-CM

## 2022-08-05 DIAGNOSIS — I723 Aneurysm of iliac artery: Secondary | ICD-10-CM

## 2022-08-05 DIAGNOSIS — R319 Hematuria, unspecified: Secondary | ICD-10-CM

## 2022-08-05 DIAGNOSIS — N400 Enlarged prostate without lower urinary tract symptoms: Secondary | ICD-10-CM | POA: Diagnosis not present

## 2022-08-05 DIAGNOSIS — E7800 Pure hypercholesterolemia, unspecified: Secondary | ICD-10-CM | POA: Diagnosis not present

## 2022-08-05 NOTE — Assessment & Plan Note (Signed)
Physical today 08/05/22.  Colonoscopy 2019.  Recommended f/u in 5 years.  Prostate issues - followed by urology.

## 2022-08-05 NOTE — Progress Notes (Unsigned)
Subjective:    [name], male    DOB: November 18, 1945, 77 y.o.   MRN: [id number]  Patient here for  Chief Complaint  Patient presents with   Annual Exam    HPI Here for a physical exam. Recently evaluated - Dr Maia Plan - CT - no inguinal hernia identified.  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 Elected observation. Saw neurology - evaluation - memory change.  MRI - no acute abnormality.  Recommended neurocognitive testing. Increased back pain.  Saw ortho.  Discussed back surgery.  He is in the process of moving to [place].  Wants to hold on any procedure prior to the move.  No chest pain or sob reported.  No abdominal pain or bowel change reported.     Past Medical History:  Diagnosis Date   Arthritis    Diverticulosis, sigmoid    Environmental allergies    Frequency of urination    H/O sleep apnea    S/P  OSA surgery -- resolved   Hemorrhoids    History of BPH    History of melanoma excision    back   History of nephritis    age 60   History of squamous cell carcinoma excision    right arm, leg, face   History of urinary retention    secondary bph with obstruction   Melanoma    Sleep apnea    Urethral stricture    Past Surgical History:  Procedure Laterality Date   cataracts Bilateral    2023   COLONOSCOPY WITH PROPOFOL N/A 06/13/2017   Procedure: COLONOSCOPY WITH PROPOFOL;  Surgeon: Christena Deem, MD;  Location: [facility] ENDOSCOPY;  Service: Endoscopy;  Laterality: N/A;   CYSTOSCOPY WITH INJECTION N/A 06/10/2022   Procedure: CYSTOSCOPY WITH INJECTION OF MITOMYCIN;  Surgeon: Sebastian Ache, MD;  Location: [facility];  Service: Urology;  Laterality: N/A;   CYSTOSCOPY WITH RETROGRADE URETHROGRAM N/A 05/08/2014   Procedure: CYSTOSCOPY WITH RETROGRADE URETHROGRAM;  Surgeon: Sebastian Ache, MD;  Location: [facility];  Service: Urology;  Laterality: N/A;   CYSTOSCOPY WITH URETHRAL DILATATION N/A 05/08/2014   Procedure: CYSTOSCOPY WITH URETHRAL  DILATATION;  Surgeon: Sebastian Ache, MD;  Location: [facility];  Service: Urology;  Laterality: N/A;   CYSTOSCOPY WITH URETHRAL DILATATION N/A 06/10/2022   Procedure: CYSTOSCOPY WITH URETHRAL DILATATION;  Surgeon: Sebastian Ache, MD;  Location: [facility];  Service: Urology;  Laterality: N/A;  45 MINS   KNEE ARTHROSCOPY W/ MENISCECTOMY Right x2   last one 2005   LAPAROSCOPIC INGUINAL HERNIA REPAIR Right 2007 (approx)   ORIF  RIGHT WRIST FX  04/25/2006   retained hardware   ROBOT ASSISTED LAPAROSCOPIC RADICAL PROSTATECTOMY N/A 03/19/2014   Procedure: ROBOTIC ASSISTED LAPAROSCOPIC SIMPLE PROSTATECTOMY;  Surgeon: Sebastian Ache, MD;  Location: [facility];  Service: Urology;  Laterality: N/A;   TONSILLECTOMY  age 16   UVULOPALATOPHARYNGOPLASTY  x5  last one 71   Family History  Problem Relation Age of Onset   Heart disease Father        CABG x 4   Arthritis Father    Glaucoma Father    Hepatitis C Mother    Thyroid disease Sister    Social History   Socioeconomic History   Marital status: Married    Spouse name: Not on file   Number of children: 2   Years of education: 16   Highest education level: Not on file  Occupational History   Not on  file  Tobacco Use   Smoking status: Never   Smokeless tobacco: Never  Vaping Use   Vaping Use: Never used  Substance and Sexual Activity   Alcohol use: Yes    Alcohol/week: 0.0 standard drinks of alcohol    Comment: rare   Drug use: No   Sexual activity: Not on file  Other Topics Concern   Not on file  Social History Narrative   Right handed   No caffeine   2 story home   Lives with his wife   retired   International aid/development worker of Corporate investment banker Strain: Low Risk  (02/03/2022)   Overall Financial Resource Strain (CARDIA)    Difficulty of Paying Living Expenses: Not hard at all  Food Insecurity: No Food Insecurity (02/03/2022)   Hunger Vital Sign    Worried About Running Out of Food in the  Last Year: Never true    Ran Out of Food in the Last Year: Never true  Transportation Needs: No Transportation Needs (02/03/2022)   PRAPARE - Administrator, Civil Service (Medical): No    Lack of Transportation (Non-Medical): No  Physical Activity: Sufficiently Active (02/03/2022)   Exercise Vital Sign    Days of Exercise per Week: 3 days    Minutes of Exercise per Session: 60 min  Stress: No Stress Concern Present (02/03/2022)   [employer] of Occupational Health - Occupational Stress Questionnaire    Feeling of Stress : Not at all  Social Connections: Unknown (02/03/2022)   Social Connection and Isolation Panel [NHANES]    Frequency of Communication with Friends and Family: Not on file    Frequency of Social Gatherings with Friends and Family: More than three times a week    Attends Religious Services: Not on file    Active Member of Clubs or Organizations: Not on file    Attends Banker Meetings: Not on file    Marital Status: Married     Review of Systems  Constitutional:  Negative for appetite change and unexpected weight change.  HENT:  Negative for congestion, sinus pressure and sore throat.   Eyes:  Negative for pain and visual disturbance.  Respiratory:  Negative for cough, chest tightness and shortness of breath.   Cardiovascular:  Negative for chest pain and palpitations.  Gastrointestinal:  Negative for abdominal pain, diarrhea, nausea and vomiting.  Genitourinary:  Negative for difficulty urinating and dysuria.  Musculoskeletal:  Positive for back pain. Negative for joint swelling and myalgias.  Skin:  Negative for color change and rash.  Neurological:  Negative for dizziness and headaches.  Hematological:  Negative for adenopathy. Does not bruise/bleed easily.  Psychiatric/Behavioral:  Negative for agitation and dysphoric mood.        Objective:     BP 110/70   Pulse 74   Temp 98 F (36.7 C)   Resp 16   Ht  (1.753 m)    Wt 171 lb (77.6 kg)   SpO2 97%   BMI 25.25 kg/m  Wt Readings from Last 3 Encounters:  08/05/22 171 lb (77.6 kg)  07/04/22 161 lb (73 kg)  06/16/22 170 lb (77.1 kg)    Physical Exam Constitutional:      General: He is not in acute distress.    Appearance: Normal appearance. He is well-developed.  HENT:     Head: Normocephalic and atraumatic.     Right Ear: External ear normal.     Left Ear: External ear normal.  Eyes:     General: No scleral icterus.       Right eye: No discharge.        Left eye: No discharge.     Conjunctiva/sclera: Conjunctivae normal.  Neck:     Thyroid: No thyromegaly.  Cardiovascular:     Rate and Rhythm: Normal rate and regular rhythm.  Pulmonary:     Effort: No respiratory distress.     Breath sounds: Normal breath sounds. No wheezing.  Abdominal:     General: Bowel sounds are normal.     Palpations: Abdomen is soft.     Tenderness: There is no abdominal tenderness.  Musculoskeletal:        General: No swelling or tenderness.     Cervical back: Neck supple. No tenderness.  Lymphadenopathy:     Cervical: No cervical adenopathy.  Skin:    Findings: No erythema or rash.  Neurological:     Mental Status: He is alert and oriented to person, place, and time.  Psychiatric:        Mood and Affect: Mood normal.        Behavior: Behavior normal.      Outpatient Encounter Medications as of 08/05/2022  Medication Sig   acetaminophen (TYLENOL) 500 MG tablet Take 500 mg by mouth every 6 (six) hours as needed.   Acetylcarnitine HCl (ACETYL L-CARNITINE PO) Take 300 mg by mouth.   Alpha-Lipoic Acid 300 MG TABS Take by mouth.   Boswellia-Glucosamine-Vit D (OSTEO BI-FLEX ONE PER DAY PO) Take by mouth.   Coenzyme Q10 (COQ10 PO) Take by mouth.   DOCUSATE SODIUM PO Take by mouth.   fexofenadine-pseudoephedrine (ALLEGRA-D 24) 180-240 MG 24 hr tablet Take 1 tablet by mouth daily.   finasteride (PROSCAR) 5 MG tablet Take 5 mg by mouth daily.   ipratropium  (ATROVENT) 0.06 % nasal spray Place 2 sprays into both nostrils 4 (four) times daily.   levocetirizine (XYZAL) 5 MG tablet Take 5 mg by mouth at bedtime.   meloxicam (MOBIC) 15 MG tablet Take 1 tablet (15 mg total) by mouth daily.   montelukast (SINGULAIR) 10 MG tablet Take 10 mg by mouth at bedtime.   Multiple Vitamins-Minerals (CENTRUM SILVER ADULT 50+ PO) Take 1 tablet by mouth daily.   Omega-3 Fatty Acids (FISH OIL PO) Take by mouth.   oxybutynin (DITROPAN) 5 MG tablet TK 1 T PO Q 8 H PRF URINARY FREQUENCY OR URGENCY   rosuvastatin (CRESTOR) 20 MG tablet TAKE 1 TABLET BY MOUTH ONCE DAILY.   triamcinolone cream (KENALOG) 0.1 % Apply topically 2 (two) times daily.   [DISCONTINUED] methylPREDNISolone (MEDROL DOSEPAK) 4 MG TBPK tablet Take by mouth. (Patient not taking: Reported on 07/04/2022)   No facility-administered encounter medications on file as of 08/05/2022.     Lab Results  Component Value Date   WBC 6.5 06/16/2022   HGB 14.1 06/16/2022   HCT 41.9 06/16/2022   PLT 242.0 06/16/2022   GLUCOSE 86 08/03/2022   CHOL 158 08/03/2022   TRIG 105.0 08/03/2022   HDL 41.50 08/03/2022   LDLCALC 96 08/03/2022   ALT 15 08/03/2022   AST 19 08/03/2022   NA 143 08/03/2022   K 4.7 08/03/2022   CL 106 08/03/2022   CREATININE 0.99 08/03/2022   BUN 30 (H) 08/03/2022   CO2 31 08/03/2022   TSH 1.64 01/26/2022   PSA 1.20 01/26/2022    CT PELVIS WO CONTRAST  Result Date: 07/21/2022 CLINICAL DATA:  pain near right lower pelvic/inguinal area  x 2 wks. Hx prior right inguinal hernia repair. EXAM: CT PELVIS WITHOUT CONTRAST TECHNIQUE: Multidetector CT imaging of the pelvis was performed following the standard protocol without intravenous contrast. RADIATION DOSE REDUCTION: This exam was performed according to the departmental dose-optimization program which includes automated exposure control, adjustment of the mA and/or kV according to patient size and/or use of iterative reconstruction technique.  COMPARISON:  CT abdomen pelvis 02/21/2014 FINDINGS: Urinary Tract:  No abnormality visualized. Bowel: Colonic diverticulosis. Otherwise unremarkable visualized pelvic bowel loops. Vascular/Lymphatic: Severe atherosclerotic plaque. No pathologically enlarged lymph nodes. No significant vascular abnormality seen. Reproductive:  Prostate unremarkable. Other: No intraperitoneal free fluid. No intraperitoneal free gas. No organized fluid collection. Musculoskeletal: Right inguinal hernia repair with tacks visualized. No recurrent hernia. No suspicious bone lesions identified. Grade 1 anterolisthesis of L4 on L5. Intervertebral disc space vacuum phenomenon at the L4-L5 level. No acute displaced sacral fracture. No acute displaced fracture or dislocation of either Hips. No acute displaced fracture or diastasis of the bones of the pelvis. IMPRESSION: 1. Right inguinal hernia repair with tacks visualized. No recurrent hernia. 2. No acute intrapelvic abnormality. Limited evaluation on this noncontrast study. 3. No acute osseous abnormality. Electronically Signed   By: Tish Frederickson M.D.   On: 07/21/2022 21:49       Assessment & Plan:  Routine general medical examination at a health care facility  Health care maintenance Assessment & Plan: Physical today 08/05/22.  Colonoscopy 2019.  Recommended f/u in 5 years.  Discussed due f/u colonoscopy.  Discussed.  Wants to hold on referral at this time.  Prostate issues - followed by urology.    Abdominal aortic aneurysm (AAA) without rupture, unspecified part Assessment & Plan: Evaluated 06/29/21 - Previous duplex ultrasound from 06/28/2021 showed: AAA 2.58 cm and stable internal iliac artery aneurysms measuring 1.3 cm in the right and 1.2 cm on the left.  Triphasic blood flow throughout the aortoiliac system.  Lower extremity duplex shows Rt pop 0.90 cm and left pop 0.94 cm aneurysms. Recommended f/u ultrasound in 24 months.    Benign prostatic hyperplasia without lower  urinary tract symptoms Assessment & Plan: S/p simple prostatectomy.  Followed by urology. Continue finasteride and oxybutynin.    Hematuria, unspecified type Assessment & Plan: Urology 04/26/22 - Sebastian Ache - felt likely from some partial prostatic fossa growth.  06/10/22 - cysto with urethral dilatation.    Hypercholesterolemia Assessment & Plan: On crestor.  Low cholesterol diet and exercise.  Follow lipid panel and liver function tests.     Iliac artery aneurysm Assessment & Plan: Evaluated by vascular 06/2021.  Recommended f/u in 24 months.    Bilateral low back pain with bilateral sciatica, unspecified chronicity Assessment & Plan: Persistent pain.  Saw ortho.  Discussed surgery.  Wants to hold until after moving.     Memory change Assessment & Plan: Saw neurology - evaluation - memory change.  MRI - no acute abnormality.  Recommended neurocognitive testing.       Dale [place], MD

## 2022-08-06 ENCOUNTER — Encounter: Payer: Self-pay | Admitting: Internal Medicine

## 2022-08-06 NOTE — Assessment & Plan Note (Signed)
On crestor.  Low cholesterol diet and exercise.  Follow lipid panel and liver function tests.   

## 2022-08-06 NOTE — Assessment & Plan Note (Signed)
Evaluated 06/29/21 - Previous duplex ultrasound from 06/28/2021 showed:?AAA 2.58 cm and stable internal iliac artery aneurysms measuring 1.3 cm in the right and 1.2 cm on the left. ?Triphasic blood flow throughout the aortoiliac system.  Lower extremity duplex shows Rt pop 0.90 cm and left pop 0.94 cm aneurysms. Recommended f/u ultrasound in 24 months.  ?

## 2022-08-06 NOTE — Assessment & Plan Note (Signed)
Evaluated by vascular 06/2021.  Recommended f/u in 24 months.  ?

## 2022-08-06 NOTE — Assessment & Plan Note (Signed)
S/p simple prostatectomy.  Followed by urology. Continue finasteride and oxybutynin.  ?

## 2022-08-06 NOTE — Assessment & Plan Note (Signed)
Saw neurology - evaluation - memory change.  MRI - no acute abnormality.  Recommended neurocognitive testing.

## 2022-08-06 NOTE — Assessment & Plan Note (Signed)
Persistent pain.  Saw ortho.  Discussed surgery.  Wants to hold until after moving.

## 2022-08-06 NOTE — Assessment & Plan Note (Signed)
Urology 04/26/22 - Theodore Manny - felt likely from some partial prostatic fossa growth.  06/10/22 - cysto with urethral dilatation.  

## 2022-08-09 DIAGNOSIS — J300 Vasomotor rhinitis: Secondary | ICD-10-CM | POA: Diagnosis not present

## 2022-08-09 DIAGNOSIS — Z880 Allergy status to penicillin: Secondary | ICD-10-CM | POA: Diagnosis not present

## 2022-08-10 ENCOUNTER — Ambulatory Visit: Admit: 2022-08-10 | Payer: PPO | Admitting: General Surgery

## 2022-08-10 SURGERY — REPAIR, HERNIA, INGUINAL, ADULT
Anesthesia: General | Site: Inguinal | Laterality: Right

## 2022-08-31 DIAGNOSIS — M9905 Segmental and somatic dysfunction of pelvic region: Secondary | ICD-10-CM | POA: Diagnosis not present

## 2022-08-31 DIAGNOSIS — M9903 Segmental and somatic dysfunction of lumbar region: Secondary | ICD-10-CM | POA: Diagnosis not present

## 2022-08-31 DIAGNOSIS — M5416 Radiculopathy, lumbar region: Secondary | ICD-10-CM | POA: Diagnosis not present

## 2022-09-01 DIAGNOSIS — M9905 Segmental and somatic dysfunction of pelvic region: Secondary | ICD-10-CM | POA: Diagnosis not present

## 2022-09-01 DIAGNOSIS — M9902 Segmental and somatic dysfunction of thoracic region: Secondary | ICD-10-CM | POA: Diagnosis not present

## 2022-09-01 DIAGNOSIS — M9901 Segmental and somatic dysfunction of cervical region: Secondary | ICD-10-CM | POA: Diagnosis not present

## 2022-09-01 DIAGNOSIS — M9903 Segmental and somatic dysfunction of lumbar region: Secondary | ICD-10-CM | POA: Diagnosis not present

## 2022-09-01 DIAGNOSIS — M5416 Radiculopathy, lumbar region: Secondary | ICD-10-CM | POA: Diagnosis not present

## 2022-09-05 ENCOUNTER — Ambulatory Visit: Payer: PPO | Admitting: Physician Assistant

## 2022-09-05 ENCOUNTER — Encounter: Payer: Self-pay | Admitting: Physician Assistant

## 2022-09-05 VITALS — BP 131/75 | HR 63 | Ht 69.0 in | Wt 171.0 lb

## 2022-09-05 DIAGNOSIS — M9905 Segmental and somatic dysfunction of pelvic region: Secondary | ICD-10-CM | POA: Diagnosis not present

## 2022-09-05 DIAGNOSIS — M9901 Segmental and somatic dysfunction of cervical region: Secondary | ICD-10-CM | POA: Diagnosis not present

## 2022-09-05 DIAGNOSIS — R413 Other amnesia: Secondary | ICD-10-CM

## 2022-09-05 DIAGNOSIS — M5416 Radiculopathy, lumbar region: Secondary | ICD-10-CM | POA: Diagnosis not present

## 2022-09-05 DIAGNOSIS — M9902 Segmental and somatic dysfunction of thoracic region: Secondary | ICD-10-CM | POA: Diagnosis not present

## 2022-09-05 DIAGNOSIS — M9903 Segmental and somatic dysfunction of lumbar region: Secondary | ICD-10-CM | POA: Diagnosis not present

## 2022-09-05 NOTE — Progress Notes (Signed)
Assessment/Plan:   Mild Cognitive Impairment   Kyle Meadows is a very pleasant 77 y.o. RH male with a history of hypertension, hyperlipidemia, BPH, iliac artery aneurysm, arthritis, sleep apnea status post OSA surgery, chronic lumbar radiculopathy, hard of hearing (wear hearing aids)  seen today in follow up to discuss the MRI of the brain results from 07/15/2022. These were personally reviewed, remarkable for age-related volume loss without lobar predominance, moderate chronic small vessel ischemic changes of the cerebral hemispheric white matter, mild chronic small vessel changes of the pons, there is some occipital horn asymmetry, and normal variant.  There is no acute findings on the MRI. Last MoCA on 07/04/2022 was 25/30.  He is able to perform all his ADLs without difficulty.  Most recent labs were normal.     Recommendations  Follow up 6 months. Patient is moving to [place] in the fall to be with his family. Recommend neuropsychological evaluation to further evaluate cognitive concerns and determine other underlying cause of memory changes, including potential contribution from sleep, anxiety or depression.  Pending on the results, the patient may need continuity of care once having moved to [place]. Continue to control mood as per PCP Recommend good control of cardiovascular risk factors  Initial evaluation 07/04/2022 How long did patient have memory difficulties?  He has difficulty with concentration for the last year.  Patient has some difficulty remembering recent conversations and people names.  He is able to read without difficulty, and enjoys doing painting, he also has an interesting architecture.  He  left the car running on his garage (the door was open), for about 45 minutes.    repeats oneself?  Endorsed Disoriented when walking into a room?  Patient denies except occasionally not remembering what he came to the room for   Leaving objects in unusual places? Leaves the glasses  "all over the place ".   Wandering behavior?  denies   Any personality changes since last visit?  Patient denies   Any depression?:  Patient denies   Hallucinations or paranoia?  Patient denies   Seizures?   Patient denies    Any sleep changes?  Denies vivid dreams, REM behavior or sleepwalking   Sleep apnea?  Endorsed, "a long time ago, but surgery helped" Any hygiene concerns?  Patient denies   Independent of bathing and dressing?  Endorsed  Does the patient needs help with medications? Patient is in charge   Who is in charge of the finances? Patient and wife are both in charge.  "I do the more difficult stuff "     Any changes in appetite?  denies  Patient have trouble swallowing?  denies   Does the patient cook? Only on the grill . Denies any accidents Any headaches?   denies   Chronic back pain endorsed, he has a history of lumbar radiculopathy and cervical radiculopathy, followed as outpatient, status post PT Ambulates with difficulty?  He is careful with walking. Recent falls or head injuries?   denies    Unilateral weakness, numbness or tingling?  "I have a lot of arthritis on the foot and it causes some tingling " Any tremors?   denies   Any anosmia?  denies   Any incontinence of urine?They operated the prostate, occasionally I have a little accident.  Any bowel dysfunction?    denies      Patient lives with his wife.  He is planning to move to [facility] in the fall to be with his  son grandchildren, he has another son also living in [place]. History of heavy alcohol intake? denies   History of heavy tobacco use?  denies   Family history of dementia?  PGM, Mother and 2 sis  dementia? Type  Does patient drive? Endorsed  Pertinent Labs B12 677, normal lipid panel and hepatic function panel.    CURRENT MEDICATIONS:  Outpatient Encounter Medications as of 09/05/2022  Medication Sig   acetaminophen (TYLENOL) 500 MG tablet Take 500 mg by mouth every 6 (six) hours as needed.    Acetylcarnitine HCl (ACETYL L-CARNITINE PO) Take 300 mg by mouth.   Alpha-Lipoic Acid 300 MG TABS Take by mouth.   Boswellia-Glucosamine-Vit D (OSTEO BI-FLEX ONE PER DAY PO) Take by mouth.   Coenzyme Q10 (COQ10 PO) Take by mouth.   DOCUSATE SODIUM PO Take by mouth.   fexofenadine-pseudoephedrine (ALLEGRA-D 24) 180-240 MG 24 hr tablet Take 1 tablet by mouth daily.   finasteride (PROSCAR) 5 MG tablet Take 5 mg by mouth daily.   ipratropium (ATROVENT) 0.06 % nasal spray Place 2 sprays into both nostrils 4 (four) times daily.   levocetirizine (XYZAL) 5 MG tablet Take 5 mg by mouth at bedtime.   meloxicam (MOBIC) 15 MG tablet Take 1 tablet (15 mg total) by mouth daily.   montelukast (SINGULAIR) 10 MG tablet Take 10 mg by mouth at bedtime.   Multiple Vitamins-Minerals (CENTRUM SILVER ADULT 50+ PO) Take 1 tablet by mouth daily.   Omega-3 Fatty Acids (FISH OIL PO) Take by mouth.   oxybutynin (DITROPAN) 5 MG tablet TK 1 T PO Q 8 H PRF URINARY FREQUENCY OR URGENCY   rosuvastatin (CRESTOR) 20 MG tablet TAKE 1 TABLET BY MOUTH ONCE DAILY.   triamcinolone cream (KENALOG) 0.1 % Apply topically 2 (two) times daily.   No facility-administered encounter medications on file as of 09/05/2022.       06/16/2022    1:50 PM 01/01/2018    9:18 AM 12/27/2016    1:23 PM  MMSE - Mini Mental State Exam  Orientation to time 5 5 5   Orientation to Place 5 5 5   Registration 3 3 3   Attention/ Calculation 5 5 5   Recall 2 3 3   Language- name 2 objects 2 2 2   Language- repeat 1 1 1   Language- follow 3 step command 3 3 3   Language- read & follow direction 1 1 1   Write a sentence 1 1 1   Copy design 1 1 1   Total score 29 30 30       07/06/2022   12:00 PM 07/04/2022   12:00 PM  Montreal Cognitive Assessment   Visuospatial/ Executive (0/5) 4 4  Naming (0/3) 3 3  Attention: Read list of digits (0/2) 2 2  Attention: Read list of letters (0/1) 1 1  Attention: Serial 7 subtraction starting at 100 (0/3) 2 3  Language:  Repeat phrase (0/2) 2 2  Language : Fluency (0/1) 0 0  Abstraction (0/2) 1 1  Delayed Recall (0/5) 3 3  Orientation (0/6) 6 6  Total 24 25  Adjusted Score (based on education) 24 25   Thank you for allowing [place] the opportunity to participate in the care of this nice patient. Please do not hesitate to contact us for any questions or concerns.   Total time spent on today's visit was 34 minutes dedicated to this patient today, preparing to see patient, examining the patient, ordering tests and/or medications and counseling the patient, documenting clinical information in the EHR or  other health record, independently interpreting results and communicating results to the patient/family, discussing treatment and goals, answering patient's questions and coordinating care.  Cc:  Dale , MD  Marlowe Kays 09/05/2022 7:03 AM

## 2022-09-08 DIAGNOSIS — M9905 Segmental and somatic dysfunction of pelvic region: Secondary | ICD-10-CM | POA: Diagnosis not present

## 2022-09-08 DIAGNOSIS — M9901 Segmental and somatic dysfunction of cervical region: Secondary | ICD-10-CM | POA: Diagnosis not present

## 2022-09-08 DIAGNOSIS — M5416 Radiculopathy, lumbar region: Secondary | ICD-10-CM | POA: Diagnosis not present

## 2022-09-08 DIAGNOSIS — M9902 Segmental and somatic dysfunction of thoracic region: Secondary | ICD-10-CM | POA: Diagnosis not present

## 2022-09-08 DIAGNOSIS — M9903 Segmental and somatic dysfunction of lumbar region: Secondary | ICD-10-CM | POA: Diagnosis not present

## 2022-09-27 ENCOUNTER — Ambulatory Visit
Admission: EM | Admit: 2022-09-27 | Discharge: 2022-09-27 | Disposition: A | Discharge status: Home / Self Care | Payer: PPO | Attending: Emergency Medicine | Admitting: Emergency Medicine

## 2022-09-27 DIAGNOSIS — J069 Acute upper respiratory infection, unspecified: Secondary | ICD-10-CM | POA: Diagnosis not present

## 2022-09-27 MED ORDER — AZITHROMYCIN 250 MG PO TABS: 250.0000 mg | ORAL_TABLET | Freq: Every day | ORAL | 0 refills | Status: AC

## 2022-09-27 MED ORDER — BENZONATATE 100 MG PO CAPS: 100.0000 mg | ORAL_CAPSULE | Freq: Three times a day (TID) | ORAL | 0 refills | Status: AC

## 2022-09-27 MED ORDER — PROMETHAZINE-DM 6.25-15 MG/5ML PO SYRP: 2.5000 mL | ORAL_SOLUTION | Freq: Every evening | ORAL | 0 refills | Status: AC | PRN

## 2022-09-27 NOTE — Discharge Instructions (Signed)
Your symptoms today are most likely being caused by a virus and should steadily improve in time it can take up to 7 to 10 days before you truly start to see a turnaround however things will get better  Prophylactically to keep her symptoms from being prolonged or or worsening to a more severe illness such as pneumonia  Take azithromycin as directed  You may use Tessalon pill every 8 hours as needed to help calm your cough  May use cough syrup at bedtime as needed for additional comfort    You can take Tylenol and/or Ibuprofen as needed for fever reduction and pain relief.   For cough: honey 1/2 to 1 teaspoon (you can dilute the honey in water or another fluid).  You can also use guaifenesin and dextromethorphan for cough. You can use a humidifier for chest congestion and cough.  If you don't have a humidifier, you can sit in the bathroom with the hot shower running.      For sore throat: try warm salt water gargles, cepacol lozenges, throat spray, warm tea or water with lemon/honey, popsicles or ice, or OTC cold relief medicine for throat discomfort.   For congestion: take a daily anti-histamine like Zyrtec, Claritin, and a oral decongestant, such as pseudoephedrine.  You can also use Flonase 1-2 sprays in each nostril daily.   It is important to stay hydrated: drink plenty of fluids (water, gatorade/powerade/pedialyte, juices, or teas) to keep your throat moisturized and help further relieve irritation/discomfort.

## 2022-09-27 NOTE — ED Triage Notes (Signed)
Patient to Urgent Care with complaints of sore throat (post nasal drip)/ productive cough/ nasal congestion. Denies any known fever.  Symptoms started three- four days ago. Reports hx of seasonal allergies.

## 2022-09-27 NOTE — ED Provider Notes (Signed)
Kyle Meadows    CSN: [id number] Arrival date & time: 09/27/22  1357      History   Chief Complaint Chief Complaint  Patient presents with   Nasal Congestion    HPI Kyle Meadows is a 77 y.o. male.   Patient presents for evaluation of nasal congestion, rhinorrhea, productive cough, postnasal drip and mild sore throat present for 4 to 5 days.  No known sick contacts.  Tolerating food and liquids.  Has attempted use of over-the-counter Tylenol and Tylenol sinus and cold which has been somewhat helpful.  Denies respiratory history, non-smoker.  Denies shortness of breath, wheezing, fever chills or bodyaches.  Past Medical History:  Diagnosis Date   Arthritis    Diverticulosis, sigmoid    Environmental allergies    Frequency of urination    H/O sleep apnea    S/P  OSA surgery -- resolved   Hemorrhoids    History of BPH    History of melanoma excision    back   History of nephritis    age 71   History of squamous cell carcinoma excision    right arm, leg, face   History of urinary retention    secondary bph with obstruction   Melanoma (HCC)    Sleep apnea    Urethral stricture     Patient Active Problem List   Diagnosis Date Noted   Right lower quadrant pain 06/26/2022   Memory change 06/16/2022   Hematuria 04/27/2022   Urethral stricture 04/27/2022   History of colonoscopy 03/24/2022   Lumbar radiculopathy 03/08/2022   Right leg pain 02/07/2022   Vasomotor rhinitis 09/04/2021   History of squamous cell carcinoma of skin 06/22/2020   Sleep apnea 06/22/2020   Knee pain 06/22/2020   AAA (abdominal aortic aneurysm) without rupture (HCC) 06/22/2020   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   Asthma, chronic 06/22/2020   Low back pain 05/27/2020   Dizziness 11/17/2018   Cervical radiculopathy 10/01/2018   Patellar tendonitis 10/01/2018   Osteoarthritis of knee 07/27/2017   Abdominal pain 12/30/2016   Iliac artery aneurysm (HCC) 11/22/2016    Hypercholesterolemia 12/08/2015   Health care maintenance 07/13/2014   DJD (degenerative joint disease) 10/09/2012   Enlarged prostate 10/09/2012   Other nonmedicinal substance allergy status 10/09/2012   Malignant neoplasm of skin 10/09/2012   Hemorrhoids 10/09/2012   Benign enlargement of prostate 10/09/2012    Past Surgical History:  Procedure Laterality Date   cataracts Bilateral    2023   COLONOSCOPY WITH PROPOFOL N/A 06/13/2017   Procedure: COLONOSCOPY WITH PROPOFOL;  Surgeon: Christena Deem, MD;  Location: [facility];  Service: Endoscopy;  Laterality: N/A;   CYSTOSCOPY WITH INJECTION N/A 06/10/2022   Procedure: CYSTOSCOPY WITH INJECTION OF MITOMYCIN;  Surgeon: Sebastian Ache, MD;  Location: [facility];  Service: Urology;  Laterality: N/A;   CYSTOSCOPY WITH RETROGRADE URETHROGRAM N/A 05/08/2014   Procedure: CYSTOSCOPY WITH RETROGRADE URETHROGRAM;  Surgeon: Sebastian Ache, MD;  Location: [facility];  Service: Urology;  Laterality: N/A;   CYSTOSCOPY WITH URETHRAL DILATATION N/A 05/08/2014   Procedure: CYSTOSCOPY WITH URETHRAL DILATATION;  Surgeon: Sebastian Ache, MD;  Location: [facility];  Service: Urology;  Laterality: N/A;   CYSTOSCOPY WITH URETHRAL DILATATION N/A 06/10/2022   Procedure: CYSTOSCOPY WITH URETHRAL DILATATION;  Surgeon: Sebastian Ache, MD;  Location: [facility];  Service: Urology;  Laterality: N/A;  45 MINS   KNEE ARTHROSCOPY W/ MENISCECTOMY Right x2   last one  2005   LAPAROSCOPIC INGUINAL HERNIA REPAIR Right 2007 (approx)   ORIF  RIGHT WRIST FX  04/25/2006   retained hardware   ROBOT ASSISTED LAPAROSCOPIC RADICAL PROSTATECTOMY N/A 03/19/2014   Procedure: ROBOTIC ASSISTED LAPAROSCOPIC SIMPLE PROSTATECTOMY;  Surgeon: Sebastian Ache, MD;  Location: [facility];  Service: Urology;  Laterality: N/A;   TONSILLECTOMY  age 24   UVULOPALATOPHARYNGOPLASTY  x5  last one 1995       Home Medications     Prior to Admission medications   Medication Sig Start Date End Date Taking? Authorizing Provider  azithromycin (ZITHROMAX) 250 MG tablet Take 1 tablet (250 mg total) by mouth daily. Take first 2 tablets together, then 1 every day until finished. 09/27/22  Yes Jeny Nield R, NP  benzonatate (TESSALON) 100 MG capsule Take 1 capsule (100 mg total) by mouth every 8 (eight) hours. 09/27/22  Yes Nahjae Hoeg R, NP  promethazine-dextromethorphan (PROMETHAZINE-DM) 6.25-15 MG/5ML syrup Take 2.5 mLs by mouth at bedtime as needed for cough. 09/27/22  Yes Chrishaun Sasso, Elita Boone, NP  acetaminophen (TYLENOL) 500 MG tablet Take 500 mg by mouth every 6 (six) hours as needed.    [provider]  Acetylcarnitine HCl (ACETYL L-CARNITINE PO) Take 300 mg by mouth.    [provider]  Alpha-Lipoic Acid 300 MG TABS Take by mouth.    [provider]  Boswellia-Glucosamine-Vit D (OSTEO BI-FLEX ONE PER DAY PO) Take by mouth.    [provider]  Coenzyme Q10 (COQ10 PO) Take by mouth.    [provider]  DOCUSATE SODIUM PO Take by mouth.    [provider]  fexofenadine-pseudoephedrine (ALLEGRA-D 24) 180-240 MG 24 hr tablet Take 1 tablet by mouth daily.    [provider]  finasteride (PROSCAR) 5 MG tablet Take 5 mg by mouth daily. 11/03/20   [provider]  ipratropium (ATROVENT) 0.06 % nasal spray Place 2 sprays into both nostrils 4 (four) times daily. 11/14/21   Becky Augusta, NP  levocetirizine (XYZAL) 5 MG tablet Take 5 mg by mouth at bedtime. 03/07/19   [provider]  meloxicam (MOBIC) 15 MG tablet Take 1 tablet (15 mg total) by mouth daily. 12/01/21   Hyatt, Max T, DPM  montelukast (SINGULAIR) 10 MG tablet Take 10 mg by mouth at bedtime. 03/30/19   [provider]  Multiple Vitamins-Minerals (CENTRUM SILVER ADULT 50+ PO) Take 1 tablet by mouth daily.    [provider]  Omega-3 Fatty Acids (FISH OIL PO) Take by mouth.     [provider]  oxybutynin (DITROPAN) 5 MG tablet TK 1 T PO Q 8 H PRF URINARY FREQUENCY OR URGENCY 03/31/15   [provider]  rosuvastatin (CRESTOR) 20 MG tablet TAKE 1 TABLET BY MOUTH ONCE DAILY. 05/04/22   Dale Izard, MD  triamcinolone cream (KENALOG) 0.1 % Apply topically 2 (two) times daily. 12/23/19   [provider]    Family History Family History  Problem Relation Age of Onset   Heart disease Father        CABG x 4   Arthritis Father    Glaucoma Father    Hepatitis C Mother    Thyroid disease Sister     Social History Social History   Tobacco Use   Smoking status: Never   Smokeless tobacco: Never  Vaping Use   Vaping Use: Never used  Substance Use Topics   Alcohol use: Yes    Alcohol/week: 0.0 standard drinks of alcohol  Comment: rare   Drug use: No     Allergies   Hydrocodone and Percocet [oxycodone-acetaminophen]   Review of Systems Review of Systems   Physical Exam Triage Vital Signs ED Triage Vitals  Enc Vitals Group     BP 09/27/22 1433 135/77     Pulse Rate 09/27/22 1433 61     Resp 09/27/22 1433 18     Temp 09/27/22 1433 98.2 F (36.8 C)     Temp Source 09/27/22 1433 Oral     SpO2 09/27/22 1433 95 %     Weight --      Height --      Head Circumference --      Peak Flow --      Pain Score 09/27/22 1437 3     Pain Loc --      Pain Edu? --      Excl. in GC? --    No data found.  Updated Vital Signs BP 135/77 (BP Location: Left Arm)   Pulse 61   Temp 98.2 F (36.8 C) (Oral)   Resp 18   SpO2 95%   Visual Acuity Right Eye Distance:   Left Eye Distance:   Bilateral Distance:    Right Eye Near:   Left Eye Near:    Bilateral Near:     Physical Exam Constitutional:      Appearance: Normal appearance.  HENT:     Right Ear: Tympanic membrane, ear canal and external ear normal.     Left Ear: Tympanic membrane, ear canal and external ear normal.     Nose: Congestion present. No rhinorrhea.      Mouth/Throat:     Mouth: Mucous membranes are moist.     Pharynx: Oropharynx is clear. No posterior oropharyngeal erythema.  Eyes:     Extraocular Movements: Extraocular movements intact.  Cardiovascular:     Rate and Rhythm: Normal rate and regular rhythm.     Pulses: Normal pulses.     Heart sounds: Normal heart sounds.  Pulmonary:     Effort: Pulmonary effort is normal.     Breath sounds: Normal breath sounds.  Skin:    General: Skin is warm and dry.  Neurological:     Mental Status: He is alert and oriented to person, place, and time. Mental status is at baseline.      UC Treatments / Results  Labs (all labs ordered are listed, but only abnormal results are displayed) Labs Reviewed - No data to display  EKG   Radiology No results found.  Procedures Procedures (including critical care time)  Medications Ordered in UC Medications - No data to display  Initial Impression / Assessment and Plan / UC Course  I have reviewed the triage vital signs and the nursing notes.  Pertinent labs & imaging results that were available during my care of the patient were reviewed by me and considered in my medical decision making (see chart for details).  Viral URI with cough  Patient is in no signs of distress nor toxic appearing.  Vital signs are stable.  Low suspicion for pneumonia, pneumothorax or bronchitis and therefore will defer imaging.  Prophylactically prescribed azithromycin, Tessalon and Promethazine DM sent to pharmacy for management of cough. May use additional over-the-counter medications as needed for supportive care.  May follow-up with urgent care as needed if symptoms persist or worsen.  Final Clinical Impressions(s) / UC Diagnoses   Final diagnoses:  Viral URI with cough  Discharge Instructions      Your symptoms today are most likely being caused by a virus and should steadily improve in time it can take up to 7 to 10 days before you truly start to see  a turnaround however things will get better  Prophylactically to keep her symptoms from being prolonged or or worsening to a more severe illness such as pneumonia  Take azithromycin as directed  You may use Tessalon pill every 8 hours as needed to help calm your cough  May use cough syrup at bedtime as needed for additional comfort    You can take Tylenol and/or Ibuprofen as needed for fever reduction and pain relief.   For cough: honey 1/2 to 1 teaspoon (you can dilute the honey in water or another fluid).  You can also use guaifenesin and dextromethorphan for cough. You can use a humidifier for chest congestion and cough.  If you don't have a humidifier, you can sit in the bathroom with the hot shower running.      For sore throat: try warm salt water gargles, cepacol lozenges, throat spray, warm tea or water with lemon/honey, popsicles or ice, or OTC cold relief medicine for throat discomfort.   For congestion: take a daily anti-histamine like Zyrtec, Claritin, and a oral decongestant, such as pseudoephedrine.  You can also use Flonase 1-2 sprays in each nostril daily.   It is important to stay hydrated: drink plenty of fluids (water, gatorade/powerade/pedialyte, juices, or teas) to keep your throat moisturized and help further relieve irritation/discomfort.    ED Prescriptions     Medication Sig Dispense Auth. Provider   azithromycin (ZITHROMAX) 250 MG tablet Take 1 tablet (250 mg total) by mouth daily. Take first 2 tablets together, then 1 every day until finished. 6 tablet Caitlynne Harbeck R, NP   benzonatate (TESSALON) 100 MG capsule Take 1 capsule (100 mg total) by mouth every 8 (eight) hours. 21 capsule Navina Wohlers R, NP   promethazine-dextromethorphan (PROMETHAZINE-DM) 6.25-15 MG/5ML syrup Take 2.5 mLs by mouth at bedtime as needed for cough. 118 mL Searcy Miyoshi, Elita Boone, NP      PDMP not reviewed this encounter.   Valinda Hoar, NP 09/27/22 1501

## 2023-01-16 ENCOUNTER — Other Ambulatory Visit: Payer: Self-pay | Admitting: Podiatry

## 2023-01-16 NOTE — Telephone Encounter (Signed)
Patient was last seen in 2023.  States has moved to [place].  Needs a refill on meloxicam.  If this can be filled, his pharmacy is [facility], [address] in [place], [place]  [address] with phone number of [phone].

## 2023-01-26 ENCOUNTER — Telehealth: Payer: Self-pay | Admitting: Internal Medicine

## 2023-01-26 DIAGNOSIS — E7800 Pure hypercholesterolemia, unspecified: Secondary | ICD-10-CM

## 2023-01-26 NOTE — Telephone Encounter (Signed)
Patient need lab orders.

## 2023-01-26 NOTE — Telephone Encounter (Signed)
Orders placed for labs

## 2023-02-01 ENCOUNTER — Other Ambulatory Visit: Payer: PPO

## 2023-02-06 ENCOUNTER — Ambulatory Visit: Payer: PPO | Admitting: Internal Medicine

## 2023-03-08 ENCOUNTER — Ambulatory Visit: Payer: PPO | Admitting: Physician Assistant

## 2023-03-27 ENCOUNTER — Other Ambulatory Visit: Payer: Self-pay | Admitting: Internal Medicine

## 2023-06-29 ENCOUNTER — Other Ambulatory Visit (INDEPENDENT_AMBULATORY_CARE_PROVIDER_SITE_OTHER): Payer: PPO

## 2023-06-29 ENCOUNTER — Encounter (INDEPENDENT_AMBULATORY_CARE_PROVIDER_SITE_OTHER): Payer: PPO

## 2023-06-29 ENCOUNTER — Ambulatory Visit (INDEPENDENT_AMBULATORY_CARE_PROVIDER_SITE_OTHER): Payer: PPO | Admitting: Vascular Surgery

## 2023-09-12 ENCOUNTER — Encounter (INDEPENDENT_AMBULATORY_CARE_PROVIDER_SITE_OTHER): Payer: Self-pay

## 2023-12-23 ENCOUNTER — Other Ambulatory Visit: Payer: Self-pay | Admitting: Internal Medicine

## 2024-03-12 NOTE — Telephone Encounter (Signed)
 Open in error
